# Patient Record
Sex: Male | Born: 1960 | Hispanic: No | Marital: Single | Smoking: Current every day smoker
Health system: Southern US, Community
[De-identification: ages and names within clinical notes are randomized; demographics above are authoritative.]

## PROBLEM LIST (undated history)

## (undated) DIAGNOSIS — I639 Cerebral infarction, unspecified: Secondary | ICD-10-CM

## (undated) DIAGNOSIS — J439 Emphysema, unspecified: Secondary | ICD-10-CM

## (undated) DIAGNOSIS — Z87891 Personal history of nicotine dependence: Secondary | ICD-10-CM

## (undated) DIAGNOSIS — Z800 Family history of malignant neoplasm of digestive organs: Secondary | ICD-10-CM

## (undated) DIAGNOSIS — F1011 Alcohol abuse, in remission: Secondary | ICD-10-CM

## (undated) DIAGNOSIS — Z85038 Personal history of other malignant neoplasm of large intestine: Secondary | ICD-10-CM

## (undated) DIAGNOSIS — I10 Essential (primary) hypertension: Secondary | ICD-10-CM

## (undated) DIAGNOSIS — K5792 Diverticulitis of intestine, part unspecified, without perforation or abscess without bleeding: Secondary | ICD-10-CM

## (undated) DIAGNOSIS — N400 Enlarged prostate without lower urinary tract symptoms: Secondary | ICD-10-CM

## (undated) DIAGNOSIS — C61 Malignant neoplasm of prostate: Secondary | ICD-10-CM

## (undated) HISTORY — DX: Family history of malignant neoplasm of digestive organs: Z80.0

## (undated) HISTORY — PX: TONSILLECTOMY: SUR1361

---

## 2018-03-24 ENCOUNTER — Inpatient Hospital Stay (HOSPITAL_COMMUNITY)
Admission: AD | Admit: 2018-03-24 | Discharge: 2018-03-28 | DRG: 391 | Disposition: A | Discharge status: Home / Self Care | Payer: Medicaid Other | Source: Other Acute Inpatient Hospital | Attending: Internal Medicine | Admitting: Internal Medicine

## 2018-03-24 DIAGNOSIS — Z6821 Body mass index (BMI) 21.0-21.9, adult: Secondary | ICD-10-CM

## 2018-03-24 DIAGNOSIS — K5720 Diverticulitis of large intestine with perforation and abscess without bleeding: Secondary | ICD-10-CM | POA: Diagnosis present

## 2018-03-24 DIAGNOSIS — E43 Unspecified severe protein-calorie malnutrition: Secondary | ICD-10-CM | POA: Diagnosis present

## 2018-03-24 DIAGNOSIS — E876 Hypokalemia: Secondary | ICD-10-CM | POA: Diagnosis present

## 2018-03-24 DIAGNOSIS — N419 Inflammatory disease of prostate, unspecified: Secondary | ICD-10-CM | POA: Diagnosis present

## 2018-03-24 DIAGNOSIS — Z720 Tobacco use: Secondary | ICD-10-CM | POA: Diagnosis not present

## 2018-03-24 DIAGNOSIS — I10 Essential (primary) hypertension: Secondary | ICD-10-CM | POA: Diagnosis present

## 2018-03-24 DIAGNOSIS — F1020 Alcohol dependence, uncomplicated: Secondary | ICD-10-CM | POA: Diagnosis present

## 2018-03-24 DIAGNOSIS — N410 Acute prostatitis: Secondary | ICD-10-CM

## 2018-03-24 DIAGNOSIS — R109 Unspecified abdominal pain: Secondary | ICD-10-CM | POA: Diagnosis present

## 2018-03-24 HISTORY — DX: Diverticulitis of intestine, part unspecified, without perforation or abscess without bleeding: K57.92

## 2018-03-24 LAB — COMPREHENSIVE METABOLIC PANEL
ALK PHOS: 52 U/L (ref 38–126)
ALT: 11 U/L — ABNORMAL LOW (ref 17–63)
ANION GAP: 11 (ref 5–15)
AST: 13 U/L — ABNORMAL LOW (ref 15–41)
Albumin: 2.4 g/dL — ABNORMAL LOW (ref 3.5–5.0)
BILIRUBIN TOTAL: 0.7 mg/dL (ref 0.3–1.2)
BUN: 8 mg/dL (ref 6–20)
CO2: 26 mmol/L (ref 22–32)
CREATININE: 0.83 mg/dL (ref 0.61–1.24)
Calcium: 8.6 mg/dL — ABNORMAL LOW (ref 8.9–10.3)
Chloride: 99 mmol/L — ABNORMAL LOW (ref 101–111)
Glucose, Bld: 81 mg/dL (ref 65–99)
Potassium: 3.3 mmol/L — ABNORMAL LOW (ref 3.5–5.1)
SODIUM: 136 mmol/L (ref 135–145)
TOTAL PROTEIN: 6.7 g/dL (ref 6.5–8.1)

## 2018-03-24 LAB — CBC
HCT: 37.4 % — ABNORMAL LOW (ref 39.0–52.0)
HEMOGLOBIN: 12 g/dL — AB (ref 13.0–17.0)
MCH: 28.7 pg (ref 26.0–34.0)
MCHC: 32.1 g/dL (ref 30.0–36.0)
MCV: 89.5 fL (ref 78.0–100.0)
PLATELETS: 569 10*3/uL — AB (ref 150–400)
RBC: 4.18 MIL/uL — AB (ref 4.22–5.81)
RDW: 13 % (ref 11.5–15.5)
WBC: 17.9 10*3/uL — AB (ref 4.0–10.5)

## 2018-03-24 MED ORDER — DEXTROSE-NACL 5-0.45 % IV SOLN
INTRAVENOUS | Status: DC
  Administered 2018-03-24 – 2018-03-26 (×3): via INTRAVENOUS

## 2018-03-24 MED ORDER — HYDROCODONE-ACETAMINOPHEN 5-325 MG PO TABS: 1.0000 | ORAL_TABLET | ORAL | Status: DC | PRN

## 2018-03-24 MED ORDER — FENTANYL CITRATE (PF) 100 MCG/2ML IJ SOLN: 50.0000 ug | INTRAMUSCULAR | Status: DC | PRN

## 2018-03-24 MED ORDER — ONDANSETRON HCL 4 MG PO TABS: 4.0000 mg | ORAL_TABLET | Freq: Four times a day (QID) | ORAL | Status: DC | PRN

## 2018-03-24 MED ORDER — ACETAMINOPHEN 325 MG PO TABS: 650.0000 mg | ORAL_TABLET | Freq: Four times a day (QID) | ORAL | Status: DC | PRN

## 2018-03-24 MED ORDER — ZOLPIDEM TARTRATE 5 MG PO TABS: 5.0000 mg | ORAL_TABLET | Freq: Every evening | ORAL | Status: DC | PRN

## 2018-03-24 MED ORDER — ACETAMINOPHEN 650 MG RE SUPP: 650.0000 mg | Freq: Four times a day (QID) | RECTAL | Status: DC | PRN

## 2018-03-24 MED ORDER — PIPERACILLIN-TAZOBACTAM 3.375 G IVPB 30 MIN
3.3750 g | Freq: Once | INTRAVENOUS | Status: AC
  Administered 2018-03-24: 3.375 g via INTRAVENOUS
  Filled 2018-03-24: qty 50

## 2018-03-24 MED ORDER — HEPARIN SODIUM (PORCINE) 5000 UNIT/ML IJ SOLN: 5000.0000 [IU] | Freq: Three times a day (TID) | INTRAMUSCULAR | Status: DC

## 2018-03-24 MED ORDER — ONDANSETRON HCL 4 MG/2ML IJ SOLN: 4.0000 mg | Freq: Four times a day (QID) | INTRAMUSCULAR | Status: DC | PRN

## 2018-03-24 NOTE — H&P (Signed)
[facility]                                                                                    Patient Demographics  Brian Dudley, is a 57 y.o. male  CSN: [id number]  MRN: [id number]  DOB - 1961/06/28  Admit Date - 03/24/2018  Outpatient Primary MD for the patient is No primary care provider on file.   With History of -  No past medical history on file.    No significant past medical history except for alcoholism  in for   Evaluation of abdominal abscess  HPI  Brian Dudley  is a 57 y.o. male, with past medical history significant for alcoholism and smoking presenting with 2 weeks history of on and off abdominal discomfort after a bout of alcoholism according to [facility]  He was admitted yesterday after he started developing fever, malaise and suprapubic pain, and was found to have leukocytosis with perforated sigmoid diverticulitis and a 7 cm abscess close to the urinary bladder.  Patient was admitted and started on IV Zosyn and his white blood cell count improved and was sent here to be evaluated by IR and surgery Dr. Gaynelle AduEric Wilson for management.  His last alcoholic drink was 2 weeks ago and the patient has no signs of withdrawal.    Review of Systems    In addition to the HPI above, and at this time No chills No Headache, No changes with Vision or hearing, No problems swallowing food or Liquids, No Chest pain, Cough or Shortness of Breath, No Abdominal pain, Bowel movements are regular, No Blood in stool or Urine, No dysuria, No new skin rashes or bruises, No new joints pains-aches,  No new weakness, tingling, numbness in any extremity, No recent weight gain or loss, No polyuria, polydypsia or polyphagia, No significant Mental Stressors.  A full 10 point Review of Systems was done, except as stated above, all other Review of Systems were negative.   Social History Social History   Tobacco Use  .  Smoking status: Not on file  Substance Use Topics  . Alcohol use: Not on file     Family History No family history on file.   Prior to Admission medications   Not on File    Allergies not on file  Physical Exam  Vitals  Blood pressure (!) 143/82, pulse (!) 115, temperature 99 F (37.2 C), temperature source Oral, resp. rate (!) 22, height 5\' 6"  (1.676 m), weight 61.7 kg (136 lb 0.4 oz), SpO2 99 %.   1. General Young male, well-developed, well-nourished in no acute distress  2. Normal affect and insight, Not Suicidal or Homicidal, Awake Alert, Oriented X 3.  3. No F.N deficits, grossly, patient moving all extremities.  4. Ears and Eyes appear Normal, Conjunctivae clear, PERRLA. Moist Oral Mucosa.  5. Supple Neck, No JVD, No cervical lymphadenopathy appriciated, No Carotid Bruits.  6. Symmetrical Chest wall movement, Good air movement bilaterally, CTAB.  7. RRR, No Gallops, Rubs or Murmurs, No Parasternal Heave.  8. Positive Bowel Sounds, Abdomen Soft, mild suprapubic tenderness but no rigidity.  9.  No Cyanosis,  Normal Skin Turgor, No Skin Rash or Bruise.  10. Good muscle tone,  joints appear normal , no effusions, Normal ROM.    Data Review  CBC No results for input(s): WBC, HGB, HCT, PLT, MCV, MCH, MCHC, RDW, LYMPHSABS, MONOABS, EOSABS, BASOSABS, BANDABS in the last 168 hours.  Invalid input(s): NEUTRABS, BANDSABD ------------------------------------------------------------------------------------------------------------------  Chemistries  No results for input(s): NA, K, CL, CO2, GLUCOSE, BUN, CREATININE, CALCIUM, MG, AST, ALT, ALKPHOS, BILITOT in the last 168 hours.  Invalid input(s): GFRCGP ------------------------------------------------------------------------------------------------------------------ CrCl cannot be calculated (No order  found.). ------------------------------------------------------------------------------------------------------------------ No results for input(s): TSH, T4TOTAL, T3FREE, THYROIDAB in the last 72 hours.  Invalid input(s): FREET3   Coagulation profile No results for input(s): INR, PROTIME in the last 168 hours. ------------------------------------------------------------------------------------------------------------------- No results for input(s): DDIMER in the last 72 hours. -------------------------------------------------------------------------------------------------------------------  Cardiac Enzymes No results for input(s): CKMB, TROPONINI, MYOGLOBIN in the last 168 hours.  Invalid input(s): CK ------------------------------------------------------------------------------------------------------------------ Invalid input(s): POCBNP   ---------------------------------------------------------------------------------------------------------------  Urinalysis No results found for: COLORURINE, APPEARANCEUR, LABSPEC, PHURINE, GLUCOSEU, HGBUR, BILIRUBINUR, KETONESUR, PROTEINUR, UROBILINOGEN, NITRITE, LEUKOCYTESUR  ----------------------------------------------------------------------------------------------------------------   Imaging results:   No results found.    Assessment & Plan  1.  Sigmoid diverticulitis and perforation with abscess 2.  Prostatitis 3.  History of alcoholism  Plan  Admit to MedSurg IV fluids IV Zosyn Thiamine/folate N.p.o. after midnight (patient taken ice chips) IR consult for abscess Contact Dr. Gaynelle Adu in a.m. for surgery evaluation and treatment  DVT Prophylaxis Heparin to start after surgery in a.m., but for now SCDs  Labs Ordered, also please review Full Orders    Code Status full  Disposition Plan: Home  Time spent in minutes : 38 minutes  Condition GUARDED   @SIGNATURE @

## 2018-03-25 ENCOUNTER — Inpatient Hospital Stay (HOSPITAL_COMMUNITY): Payer: Medicaid Other

## 2018-03-25 ENCOUNTER — Other Ambulatory Visit: Payer: Self-pay

## 2018-03-25 ENCOUNTER — Encounter (HOSPITAL_COMMUNITY): Payer: Self-pay | Admitting: Physician Assistant

## 2018-03-25 DIAGNOSIS — K5720 Diverticulitis of large intestine with perforation and abscess without bleeding: Secondary | ICD-10-CM

## 2018-03-25 LAB — SURGICAL PCR SCREEN
MRSA, PCR: NEGATIVE
Staphylococcus aureus: NEGATIVE

## 2018-03-25 LAB — PROTIME-INR
INR: 1.21
PROTHROMBIN TIME: 15.2 s (ref 11.4–15.2)

## 2018-03-25 LAB — HIV ANTIBODY (ROUTINE TESTING W REFLEX): HIV Screen 4th Generation wRfx: NONREACTIVE

## 2018-03-25 MED ORDER — HEPARIN SODIUM (PORCINE) 5000 UNIT/ML IJ SOLN
5000.0000 [IU] | Freq: Three times a day (TID) | INTRAMUSCULAR | Status: DC
  Administered 2018-03-26 – 2018-03-28 (×7): 5000 [IU] via SUBCUTANEOUS
  Filled 2018-03-25 (×7): qty 1

## 2018-03-25 MED ORDER — MUPIROCIN 2 % EX OINT: 1.0000 "application " | TOPICAL_OINTMENT | Freq: Two times a day (BID) | CUTANEOUS | Status: DC

## 2018-03-25 MED ORDER — LIDOCAINE HCL 1 % IJ SOLN
INTRAMUSCULAR | Status: AC
Start: 2018-03-25 — End: 2018-03-26
  Filled 2018-03-25: qty 20

## 2018-03-25 MED ORDER — POTASSIUM CHLORIDE CRYS ER 20 MEQ PO TBCR
20.0000 meq | EXTENDED_RELEASE_TABLET | Freq: Every day | ORAL | Status: DC
  Administered 2018-03-25 – 2018-03-26 (×2): 20 meq via ORAL
  Filled 2018-03-25 (×2): qty 1

## 2018-03-25 MED ORDER — PIPERACILLIN-TAZOBACTAM 3.375 G IVPB
3.3750 g | Freq: Three times a day (TID) | INTRAVENOUS | Status: DC
  Administered 2018-03-25 – 2018-03-27 (×7): 3.375 g via INTRAVENOUS
  Filled 2018-03-25 (×8): qty 50

## 2018-03-25 MED ORDER — IOHEXOL 300 MG/ML  SOLN
100.0000 mL | Freq: Once | INTRAMUSCULAR | Status: AC | PRN
  Administered 2018-03-25: 100 mL via INTRAVENOUS

## 2018-03-25 MED ORDER — MIDAZOLAM HCL 2 MG/2ML IJ SOLN
INTRAMUSCULAR | Status: AC
  Filled 2018-03-25: qty 6

## 2018-03-25 MED ORDER — IOPAMIDOL (ISOVUE-300) INJECTION 61%
15.0000 mL | INTRAVENOUS | Status: AC
  Administered 2018-03-25 (×2): 15 mL via ORAL

## 2018-03-25 MED ORDER — FENTANYL CITRATE (PF) 100 MCG/2ML IJ SOLN
INTRAMUSCULAR | Status: AC
  Filled 2018-03-25: qty 2

## 2018-03-25 NOTE — Progress Notes (Signed)
Pharmacy Antibiotic Note  Brian Dudley is a 57 y.o. male admitted on 03/24/2018 with intra abdominal infection. Pharmacy has been consulted for zosyn dosing.  Plan: Zosyn 3.375g IV q8h (4 hour infusion).  F/u cultures and clinical course  Height: 5\' 6"  (167.6 cm) Weight: 136 lb 0.4 oz (61.7 kg) IBW/kg (Calculated) : 63.8  Temp (24hrs), Avg:99 F (37.2 C), Min:99 F (37.2 C), Max:99 F (37.2 C)  Recent Labs  Lab 03/24/18 2245  WBC 17.9*  CREATININE 0.83    Estimated Creatinine Clearance: 85.7 mL/min (by C-G formula based on SCr of 0.83 mg/dL).    Allergies not on file  Thank you for allowing pharmacy to be a part of this patient's care.  Brian Dudley,  Brian Dudley 03/25/2018 12:34 AM

## 2018-03-25 NOTE — Sedation Documentation (Signed)
Dr. Deanne CofferHassell at bedside to discuss case with pt. There is no safe widow to place drain. Dr. Deanne CofferHassell to discuss case with surgeon. Pt to be transported back to room 6N22.

## 2018-03-25 NOTE — Progress Notes (Signed)
[facility]                                                                              Patient Demographics  Brian Dudley, is a 57 y.o. male, DOB - 04/29/1961, ZOX:[id number]  Admit date - 03/24/2018   Admitting Physician Lahoma Crockerheresa C Sheehan, MD  Outpatient Primary MD for the patient is No primary care provider on file.  Outpatient specialists:   LOS - 1  days    No chief complaint on file.      Brief summary   Brian Dutyhomas Cabal  is a 57 y.o. male, with past medical history significant for but not limited to alcoholism presenting with 2 weeks history abdominal discomfort after a bout of alcoholism, and noted to have  perforated sigmoid diverticulitis and abscess close to the urinary bladder, an an outside facility.    Patient was admitted and started on IV Zosyn and his white blood cell count improved and was sent here to be evaluated by IR and surgery Dr. Gaynelle AduEric Wilson for management.  His last alcoholic drink was 2 weeks ago and the patient has no signs of withdrawal.     Assessment & Plan    Active Problems:   Perforation of sigmoid colon due to diverticulitis   #1.Perforation of sigmoid colon due to diverticulitis with abscess: Trend lekocytosis IV fluids IV Abx Thiamine/folate Seen by Dr Johna SheriffHoxworth - for repeat scan to decide on poss intervention with perc cut drainage by IR. Cont NPO for now  #2: Hypokalemia: Replete  #3 Protein Cal Malnutrition, severe: Nutritional support  #4 Alcoholism: Cont CIWA protocol      Consultants:  Surgery  Interventional radiology  Procedures:  None  Antimicrobials:  Zosyn  DVT prophylaxis: Subcu heparin 3 times daily    Code Status: full code DVT Prophylaxis:  Lovenox  Family Communication: Discussed in detail with the patient  Disposition Plan: Home  Time Spent in minutes  35 minutes  Procedures:    Consultants:   IR GEN SURGERY   Antimicrobials:       Medications  Scheduled Meds: . [START ON 03/26/2018] heparin  5,000 Units Subcutaneous Q8H   Continuous Infusions: . dextrose 5 % and 0.45% NaCl 75 mL/hr at 03/24/18 2310   PRN Meds:.acetaminophen **OR** acetaminophen, fentaNYL (SUBLIMAZE) injection, HYDROcodone-acetaminophen, ondansetron **OR** ondansetron (ZOFRAN) IV, zolpidem   Antibiotics   Anti-infectives (From admission, onward)   Start     Dose/Rate Route Frequency Ordered Stop   03/24/18 2215  piperacillin-tazobactam (ZOSYN) IVPB 3.375 g     3.375 g 100 mL/hr over 30 Minutes Intravenous  Once 03/24/18 2206 03/24/18 2341        Subjective:   Brian Dutyhomas Ferrera was seen and examined today.  Patient denies dizziness, chest pain, shortness of breath. His abdominal pain is a lot better  Objective:   Vitals:   03/24/18 2159 03/25/18 0521  BP: (!) 143/82 (!) 157/84  Pulse: (!) 115 98  Resp: (!) 22 14  Temp: 99 F (37.2 C) 98.9 F (37.2 C)  TempSrc: Oral Oral  SpO2: 99% 100%  Weight: 61.7 kg (136 lb 0.4 oz)   Height:  5\' 6"  (1.676 m)     Intake/Output Summary (Last 24 hours) at 03/25/2018 0914 Last data filed at 03/25/2018 0430 Gross per 24 hour  Intake 430 ml  Output -  Net 430 ml     Wt Readings from Last 3 Encounters:  03/24/18 61.7 kg (136 lb 0.4 oz)     Exam  General: NAD  HEENT: NCAT,  PERRL,MMM  Neck: SUPPLE, (-) JVD  Cardiovascular: RRR, (-) GALLOP, (-) MURMUR  Respiratory: CTA  Gastrointestinal: SOFT, (-) DISTENSION, BS(+), (+) mild lower abd TENDERNESS  Ext: (-) CYANOSIS, (-) EDEMA  Neuro: A, OX 3  Skin:(-) RASH  Psych:NORMAL AFFECT/MOOD   Data Reviewed:  I have personally reviewed following labs and imaging studies  Micro Results Recent Results (from the past 240 hour(s))  Surgical PCR screen     Status: None   Collection Time: 03/25/18  2:02 AM  Result Value Ref Range Status   MRSA, PCR NEGATIVE NEGATIVE Final   Staphylococcus aureus NEGATIVE NEGATIVE Final     Comment: (NOTE) The Xpert SA Assay (FDA approved for NASAL specimens in patients 20 years of age and older), is one component of a comprehensive surveillance program. It is not intended to diagnose infection nor to guide or monitor treatment. Performed at [facility], [address], [place], [place] [address]     Radiology Reports No results found.  Lab Data:  CBC: Recent Labs  Lab 03/24/18 2245  WBC 17.9*  HGB 12.0*  HCT 37.4*  MCV 89.5  PLT 569*   Basic Metabolic Panel: Recent Labs  Lab 03/24/18 2245  NA 136  K 3.3*  CL 99*  CO2 26  GLUCOSE 81  BUN 8  CREATININE 0.83  CALCIUM 8.6*   GFR: Estimated Creatinine Clearance: 85.7 mL/min (by C-G formula based on SCr of 0.83 mg/dL). Liver Function Tests: Recent Labs  Lab 03/24/18 2245  AST 13*  ALT 11*  ALKPHOS 52  BILITOT 0.7  PROT 6.7  ALBUMIN 2.4*   No results for input(s): LIPASE, AMYLASE in the last 168 hours. No results for input(s): AMMONIA in the last 168 hours. Coagulation Profile: No results for input(s): INR, PROTIME in the last 168 hours. Cardiac Enzymes: No results for input(s): CKTOTAL, CKMB, CKMBINDEX, TROPONINI in the last 168 hours. BNP (last 3 results) No results for input(s): PROBNP in the last 8760 hours. HbA1C: No results for input(s): HGBA1C in the last 72 hours. CBG: No results for input(s): GLUCAP in the last 168 hours. Lipid Profile: No results for input(s): CHOL, HDL, LDLCALC, TRIG, CHOLHDL, LDLDIRECT in the last 72 hours. Thyroid Function Tests: No results for input(s): TSH, T4TOTAL, FREET4, T3FREE, THYROIDAB in the last 72 hours. Anemia Panel: No results for input(s): VITAMINB12, FOLATE, FERRITIN, TIBC, IRON, RETICCTPCT in the last 72 hours. Urine analysis: No results found for: COLORURINE, APPEARANCEUR, LABSPEC, PHURINE, GLUCOSEU, HGBUR, BILIRUBINUR, KETONESUR, PROTEINUR, UROBILINOGEN, NITRITE, Murrell Converse M.D. [facility] 03/25/2018,  9:14 AM  Pager: [phone] Between 7am to 7pm - call Pager - [phone]  After 7pm go to [email or web address] - password [email or web address]  Call night coverage person covering after 7pm

## 2018-03-25 NOTE — Progress Notes (Signed)
Patient arrived via stretcher and two Production managertransport personnel. Brian Dudley walked to the bed from the stretcher, was oriented to the room and presented with mild confusion as to where he was and why he was here.

## 2018-03-25 NOTE — Consult Note (Signed)
Reason for Consult: Abdominal abscess Referring Physician: Dr. Roxanne Mins Brian Dudley is an 57 y.o. male.  HPI: Patient is a 57 year old male, alcoholic, working regularly who 2 to 3 weeks ago developed lower abdominal pain with bowel movements.  The pain gradually became worse and he presented to his local hospital in [facility].  CT scan reportedly showed sigmoid diverticulitis with a 7 cm abscess adjacent to the bladder.  He was started on IV antibiotics.  He has family here in [place] and transfer was requested as interventional radiology was not available at that facility.  He was transferred yesterday.  He states his pain is quite a bit better compared to admission 2 days ago.  Denies any previous history of similar symptoms or chronic GI complaints.  Bowel movements other than pain have been normal.  No urinary symptoms.  Denies any chronic medical problems.  Only surgery is tonsillectomy  No home medications  Allergies none   Social History: Drinks heavily, none in 2 weeks.   Current Facility-Administered Medications  Medication Dose Route Frequency Provider Last Rate Last Dose  . acetaminophen (TYLENOL) tablet 650 mg  650 mg Oral Q6H PRN Merton Border, MD       Or  . acetaminophen (TYLENOL) suppository 650 mg  650 mg Rectal Q6H PRN Merton Border, MD      . dextrose 5 %-0.45 % sodium chloride infusion   Intravenous Continuous Merton Border, MD 75 mL/hr at 03/24/18 2310    . fentaNYL (SUBLIMAZE) injection 50 mcg  50 mcg Intravenous Q2H PRN Merton Border, MD      . Derrill Memo ON 03/26/2018] heparin injection 5,000 Units  5,000 Units Subcutaneous Q8H Ardis Rowan, PA-C      . HYDROcodone-acetaminophen (NORCO/VICODIN) 5-325 MG per tablet 1-2 tablet  1-2 tablet Oral Q4H PRN Merton Border, MD      . ondansetron (ZOFRAN) tablet 4 mg  4 mg Oral Q6H PRN Merton Border, MD       Or  . ondansetron (ZOFRAN) injection 4 mg  4 mg Intravenous Q6H PRN Merton Border, MD      . zolpidem  (AMBIEN) tablet 5 mg  5 mg Oral QHS PRN,MR X 1 Merton Border, MD         Results for orders placed or performed during the hospital encounter of 03/24/18 (from the past 48 hour(s))  Comprehensive metabolic panel     Status: Abnormal   Collection Time: 03/24/18 10:45 PM  Result Value Ref Range   Sodium 136 135 - 145 mmol/L   Potassium 3.3 (L) 3.5 - 5.1 mmol/L   Chloride 99 (L) 101 - 111 mmol/L   CO2 26 22 - 32 mmol/L   Glucose, Bld 81 65 - 99 mg/dL   BUN 8 6 - 20 mg/dL   Creatinine, Ser 0.83 0.61 - 1.24 mg/dL   Calcium 8.6 (L) 8.9 - 10.3 mg/dL   Total Protein 6.7 6.5 - 8.1 g/dL   Albumin 2.4 (L) 3.5 - 5.0 g/dL   AST 13 (L) 15 - 41 U/L   ALT 11 (L) 17 - 63 U/L   Alkaline Phosphatase 52 38 - 126 U/L   Total Bilirubin 0.7 0.3 - 1.2 mg/dL   GFR calc non Af Amer >60 >60 mL/min   GFR calc Af Amer >60 >60 mL/min    Comment: (NOTE) The eGFR has been calculated using the CKD EPI equation. This calculation has not been validated in all clinical situations. eGFR's persistently <60 mL/min  signify possible Chronic Kidney Disease.    Anion gap 11 5 - 15    Comment: Performed at [facility] [address], [place], [place] [address]  CBC     Status: Abnormal   Collection Time: 03/24/18 10:45 PM  Result Value Ref Range   WBC 17.9 (H) 4.0 - 10.5 K/uL   RBC 4.18 (L) 4.22 - 5.81 MIL/uL   Hemoglobin 12.0 (L) 13.0 - 17.0 g/dL   HCT 37.4 (L) 39.0 - 52.0 %   MCV 89.5 78.0 - 100.0 fL   MCH 28.7 26.0 - 34.0 pg   MCHC 32.1 30.0 - 36.0 g/dL   RDW 13.0 11.5 - 15.5 %   Platelets 569 (H) 150 - 400 K/uL    Comment: Performed at [facility] [address] [place], [place] [address]  Surgical PCR screen     Status: None   Collection Time: 03/25/18  2:02 AM  Result Value Ref Range   MRSA, PCR NEGATIVE NEGATIVE   Staphylococcus aureus NEGATIVE NEGATIVE    Comment: (NOTE) The Xpert SA Assay (FDA approved for NASAL specimens in patients 29 years of age and older), is one component of a  comprehensive surveillance program. It is not intended to diagnose infection nor to guide or monitor treatment. Performed at [facility], [place] [address], [place],  [address]     No results found.  Review of Systems  Constitutional: Negative for chills and fever.  HENT: Negative.   Respiratory: Negative.   Cardiovascular: Negative.   Gastrointestinal: Positive for abdominal pain. Negative for blood in stool, constipation, diarrhea, nausea and vomiting.  Genitourinary: Negative.   Musculoskeletal: Negative.    Blood pressure (!) 157/84, pulse 98, temperature 98.9 F (37.2 C), temperature source Oral, resp. rate 14, height 5' 6" (1.676 m), weight 61.7 kg (136 lb 0.4 oz), SpO2 100 %. Physical Exam General: Alert, well-developed African-American male, in no distress Skin: Warm and dry without rash or infection. HEENT: No palpable masses or thyromegaly. Sclera nonicteric.  Poor dentition. Lymph nodes: No cervical, supraclavicular, or inguinal nodes palpable. Lungs: Breath sounds clear and equal without increased work of breathing Cardiovascular: Regular rate and rhythm without murmur. No JVD or edema. Peripheral pulses intact. Abdomen: Nondistended.  Localized suprapubic tenderness with some guarding.  No palpable mass. No organomegaly. No palpable hernias. Extremities: No edema or joint swelling or deformity. No chronic venous stasis changes. Neurologic: Alert and fully oriented.  Affect normal.  No gross motor or sensory deficits.  Assessment/Plan: Apparent acute diverticulitis with pericolonic abscess.  No imaging currently to review.  Patient is on appropriate broad-spectrum antibiotics and is clinically improved.  No acute surgical abdomen.  He is to go for repeat CT scan and if abscess confirmed agree with plan for percutaneous drainage.  We will follow.  Darene Lamer  03/25/2018, 9:01 AM

## 2018-03-25 NOTE — H&P (Signed)
Chief Complaint: Abscess   Referring Physician(s): Hijazi Hoxworth  Supervising Physician: Oley BalmHassell, Daniel  [facility]  History of Present Illness: Brian Dudley is a 57 y.o. male who developed lower abdominal pain with bowel movements.    The pain gradually became worse and he presented to [facility] [place].    CT scan there reportedly showed sigmoid diverticulitis with a 7 cm abscess adjacent to the bladder.  He was started on IV antibiotics.    Interventional radiology is not available at that Hospital and since he has family here in [place], he requested transfer here to [facility].  He states his pain is quite a bit better compared to admission 2 days ago.    Denies any previous history of similar symptoms or chronic GI complaints.  Bowel movements other than pain have been normal.  No urinary symptoms. No fever/chills.   We are asked to evaluate him for image guided drainage/drain placement.  He denies any other medical issues.    Past Medical History: Alcoholism Prostatitis  Past Surgical History: Tonsillectomy  Allergies: Patient has no allergy information on record.  Medications: Prior to Admission medications   Not on File     History reviewed. No pertinent family history.  Social History   Socioeconomic History  . Marital status: Unknown    Spouse name: Not on file  . Number of children: Not on file  . Years of education: Not on file  . Highest education level: Not on file  Occupational History  . Not on file  Social Needs  . Financial resource strain: Not on file  . Food insecurity:    Worry: Not on file    Inability: Not on file  . Transportation needs:    Medical: Not on file    Non-medical: Not on file  Tobacco Use  . Smoking status: Not on file  Substance and Sexual Activity  . Alcohol use: Not on file  . Drug use: Not on file  . Sexual activity: Not on  file  Lifestyle  . Physical activity:    Days per week: Not on file    Minutes per session: Not on file  . Stress: Not on file  Relationships  . Social connections:    Talks on phone: Not on file    Gets together: Not on file    Attends religious service: Not on file    Active member of club or organization: Not on file    Attends meetings of clubs or organizations: Not on file    Relationship status: Not on file  Other Topics Concern  . Not on file  Social History Narrative  . Not on file     Review of Systems: A 12 point ROS discussed and pertinent positives are indicated in the HPI above.  All other systems are negative. Review of Systems  Vital Signs: BP (!) 157/84 (BP Location: Left Arm)   Pulse 98   Temp 98.9 F (37.2 C) (Oral)   Resp 14   Ht 5\' 6"  (1.676 m)   Wt 136 lb 0.4 oz (61.7 kg)   SpO2 100%   BMI 21.95 kg/m   Physical Exam  Constitutional: He is oriented to person, place, and time. He appears well-developed.  HENT:  Head: Normocephalic and atraumatic.  Eyes: EOM are normal.  Neck: Normal range of motion.  Cardiovascular: Normal rate, regular rhythm and normal heart sounds.  Pulmonary/Chest: Effort  normal and breath sounds normal.  Abdominal: Soft. There is tenderness.  Musculoskeletal: Normal range of motion.  Neurological: He is alert and oriented to person, place, and time.  Skin: Skin is warm and dry.  Psychiatric: He has a normal mood and affect. His behavior is normal. Judgment and thought content normal.  Vitals reviewed.   Imaging: Ct Abdomen Pelvis W Contrast  Result Date: 03/25/2018 CLINICAL DATA:  Abdominal pain. Fever. Sigmoid colon perforation secondary to diverticulitis. Suspicion of abscess. EXAM: CT ABDOMEN AND PELVIS WITH CONTRAST TECHNIQUE: Multidetector CT imaging of the abdomen and pelvis was performed using the standard protocol following bolus administration of intravenous contrast. CONTRAST:  OMNIPAQUE IOHEXOL 300 MG/ML   SOLN COMPARISON:  None. FINDINGS: Lower chest: Emphysema. Subsegmental atelectasis or scarring at both lung bases. Normal heart size without pericardial or pleural effusion. Hepatobiliary: Too small to characterize right liver lobe lesion on image 16/3. Focal steatosis adjacent the falciform ligament. Normal gallbladder, without biliary ductal dilatation. Pancreas: Normal, without mass or ductal dilatation. Spleen: Normal in size, without focal abnormality. Adrenals/Urinary Tract: Normal adrenal glands. Left renal too small to characterize lesions. Normal right kidney, without hydronephrosis or hydroureter. Normal urinary bladder. Stomach/Bowel: The stomach is primarily decompressed and underdistended. Relatively mild diverticulosis centered in the sigmoid. Wall thickening with relatively localized edema and a macrolobulated complex fluid collection or collections within the mesocolon. This is peripherally enhancing and measures on the order of 8.1 x 6.4 cm on image 47/3. 6.5 cm craniocaudal on sagittal image 90/7. No free perforation. Normal terminal ileum and appendix. Normal small bowel. Vascular/Lymphatic: Aortic and branch vessel atherosclerosis. No abdominopelvic adenopathy. Reproductive: Normal prostate. Other: No significant free fluid. There is mild presacral interstitial thickening which is nonspecific and may track from the more cephalad pelvic process. Musculoskeletal: Degenerate disc disease at the lumbosacral junction. IMPRESSION: 1. Complicated sigmoid diverticulitis. Macrolobulated complex fluid collection or collections within the sigmoid mesocolon, most consistent with phlegmon or developing abscess/abscesses. 2.  Aortic Atherosclerosis (ICD10-I70.0). These results will be called to the ordering clinician or representative by the Radiologist Assistant, and communication documented in the PACS or zVision Dashboard. Electronically Signed   By: Jeronimo Greaves M.D.   On: 03/25/2018 12:57     Labs:  CBC: Recent Labs    03/24/18 2245  WBC 17.9*  HGB 12.0*  HCT 37.4*  PLT 569*    COAGS: Recent Labs    03/25/18 0841  INR 1.21    BMP: Recent Labs    03/24/18 2245  NA 136  K 3.3*  CL 99*  CO2 26  GLUCOSE 81  BUN 8  CALCIUM 8.6*  CREATININE 0.83  GFRNONAA >60  GFRAA >60    LIVER FUNCTION TESTS: Recent Labs    03/24/18 2245  BILITOT 0.7  AST 13*  ALT 11*  ALKPHOS 52  PROT 6.7  ALBUMIN 2.4*    TUMOR MARKERS: No results for input(s): AFPTM, CEA, CA199, CHROMGRNA in the last 8760 hours.  Assessment and Plan:  8 cm diverticular abscess.  Images reviewed by Dr. Deanne Coffer  Will proceed with attempt at image guided drainage/drain placement today.  Risks and benefits discussed with the patient including bleeding, infection, damage to adjacent structures, bowel perforation/fistula connection, and sepsis.  All of the patient's questions were answered, patient is agreeable to proceed. Consent signed and in chart.  Thank you for this interesting consult.  I greatly enjoyed meeting Brian Dudley and look forward to participating in their care.  A copy of  this report was sent to the requesting provider on this date.  Electronically Signed: Gwynneth Macleod, PA-C   03/25/2018, 1:12 PM      I spent a total of 40 Minutes in face to face in clinical consultation, greater than 50% of which was counseling/coordinating care for Abscess drain placement

## 2018-03-26 LAB — CBC
HEMATOCRIT: 37.5 % — AB (ref 39.0–52.0)
HEMOGLOBIN: 12.5 g/dL — AB (ref 13.0–17.0)
MCH: 29.5 pg (ref 26.0–34.0)
MCHC: 33.3 g/dL (ref 30.0–36.0)
MCV: 88.4 fL (ref 78.0–100.0)
Platelets: 529 10*3/uL — ABNORMAL HIGH (ref 150–400)
RBC: 4.24 MIL/uL (ref 4.22–5.81)
RDW: 13 % (ref 11.5–15.5)
WBC: 17.2 10*3/uL — ABNORMAL HIGH (ref 4.0–10.5)

## 2018-03-26 LAB — COMPREHENSIVE METABOLIC PANEL
ALK PHOS: 49 U/L (ref 38–126)
ALT: 9 U/L — AB (ref 17–63)
AST: 12 U/L — ABNORMAL LOW (ref 15–41)
Albumin: 2.3 g/dL — ABNORMAL LOW (ref 3.5–5.0)
Anion gap: 8 (ref 5–15)
BUN: <5 mg/dL — ABNORMAL LOW (ref 6–20)
CALCIUM: 8.4 mg/dL — AB (ref 8.9–10.3)
CO2: 26 mmol/L (ref 22–32)
CREATININE: 0.7 mg/dL (ref 0.61–1.24)
Chloride: 101 mmol/L (ref 101–111)
GFR calc non Af Amer: >60 mL/min (ref >60)
Glucose, Bld: 132 mg/dL — ABNORMAL HIGH (ref 65–99)
Potassium: 2.8 mmol/L — ABNORMAL LOW (ref 3.5–5.1)
Sodium: 135 mmol/L (ref 135–145)
Total Bilirubin: 0.5 mg/dL (ref 0.3–1.2)
Total Protein: 6.7 g/dL (ref 6.5–8.1)

## 2018-03-26 MED ORDER — LORAZEPAM 2 MG/ML IJ SOLN: 0.0000 mg | Freq: Two times a day (BID) | INTRAMUSCULAR | Status: DC

## 2018-03-26 MED ORDER — LORAZEPAM 2 MG/ML IJ SOLN: 0.0000 mg | Freq: Four times a day (QID) | INTRAMUSCULAR | Status: AC

## 2018-03-26 MED ORDER — MAGNESIUM SULFATE 2 GM/50ML IV SOLN
2.0000 g | Freq: Once | INTRAVENOUS | Status: AC
  Administered 2018-03-26: 2 g via INTRAVENOUS
  Filled 2018-03-26: qty 50

## 2018-03-26 MED ORDER — POTASSIUM CHLORIDE CRYS ER 20 MEQ PO TBCR
40.0000 meq | EXTENDED_RELEASE_TABLET | Freq: Once | ORAL | Status: AC
  Administered 2018-03-26: 40 meq via ORAL
  Filled 2018-03-26: qty 2

## 2018-03-26 MED ORDER — POTASSIUM CHLORIDE 10 MEQ/100ML IV SOLN
10.0000 meq | INTRAVENOUS | Status: AC
  Administered 2018-03-26 (×6): 10 meq via INTRAVENOUS
  Filled 2018-03-26: qty 100

## 2018-03-26 MED ORDER — VITAMIN B-1 100 MG PO TABS
100.0000 mg | ORAL_TABLET | Freq: Every day | ORAL | Status: DC
  Administered 2018-03-26 – 2018-03-28 (×3): 100 mg via ORAL
  Filled 2018-03-26 (×3): qty 1

## 2018-03-26 MED ORDER — FOLIC ACID 1 MG PO TABS
1.0000 mg | ORAL_TABLET | Freq: Every day | ORAL | Status: DC
  Administered 2018-03-26 – 2018-03-28 (×3): 1 mg via ORAL
  Filled 2018-03-26 (×3): qty 1

## 2018-03-26 MED ORDER — THIAMINE HCL 100 MG/ML IJ SOLN: 100.0000 mg | Freq: Every day | INTRAMUSCULAR | Status: DC

## 2018-03-26 MED ORDER — ADULT MULTIVITAMIN W/MINERALS CH
1.0000 | ORAL_TABLET | Freq: Every day | ORAL | Status: DC
  Administered 2018-03-26 – 2018-03-28 (×3): 1 via ORAL
  Filled 2018-03-26 (×3): qty 1

## 2018-03-26 MED ORDER — LORAZEPAM 1 MG PO TABS: 1.0000 mg | ORAL_TABLET | Freq: Four times a day (QID) | ORAL | Status: DC | PRN

## 2018-03-26 MED ORDER — LORAZEPAM 2 MG/ML IJ SOLN: 1.0000 mg | Freq: Four times a day (QID) | INTRAMUSCULAR | Status: DC | PRN

## 2018-03-26 MED ORDER — KCL IN DEXTROSE-NACL 20-5-0.45 MEQ/L-%-% IV SOLN
INTRAVENOUS | Status: DC
  Administered 2018-03-26 – 2018-03-27 (×2): via INTRAVENOUS
  Filled 2018-03-26 (×2): qty 1000

## 2018-03-26 NOTE — Progress Notes (Signed)
PROGRESS NOTE  Brian Dudley ZOX:[id number] DOB: 1961/03/03 DOA: 03/24/2018 PCP: No primary care provider on file.  HPI/Recap of past 24 hours: [name] is a 57 year old male with past medical history significant for alcohol use disorder and tobacco use disorder who presented to the ED with 2 weeks history of intermittent abdominal discomfort.  CT abdomen and pelvis with contrast done on 03/25/2018 revealed complicated sigmoid diverticulitis with suspected developing abscess/abscesses.  Admitted for acute sigmoid diverticulitis complicated by suspected abscesses.  GI consulted and following.  03/26/2018: Patient seen and examined at bedside.  He denies any abdominal pain or nausea.  He is eating a clear liquid diet and tolerated well.  Admits to flatulence.  Assessment/Plan: Active Problems:   Perforation of sigmoid colon due to diverticulitis   Colonic diverticular abscess  Acute complicated sigmoid diverticulitis with suspicion for phlegmon Not amenable to drainage per interventional radiology General surgery consulted and following Continue to monitor fever curve Continue to monitor CBC, WBC Patient denies any abdominal pain today Advance diet as tolerated per general surgery recommendation   Hypokalemia Potassium 2.8 Repleted with p.o. and IV potassium Added IV magnesium 2 g Repeat BMP in the morning Obtain magnesium level in the morning  Severe protein calorie malnutrition Albumin 2.3 BMI 21 Encourage increased p.o. protein calorie intake  Alcohol use disorder with concern for withdrawal Continue CIWA protocol  Diverticulosis Follow-up with GI outpatient  Tobacco use disorder Nicotine patch as needed  Code Status: Full code  Family Communication: None at bedside  Disposition Plan: Home when clinically stable   Consultants:  Surgery  Interventional radiology  Procedures:  None  Antimicrobials:  Zosyn  DVT prophylaxis: Subcu heparin 3 times  daily   Objective: Vitals:   03/25/18 2239 03/26/18 0441 03/26/18 0442 03/26/18 1217  BP: 140/77 (!) 131/117 (!) 147/84 (!) 147/85  Pulse: 96 92 92 96  Resp: 18 16  16   Temp: 98.8 F (37.1 C) 98.2 F (36.8 C)  98.3 F (36.8 C)  TempSrc: Oral Oral  Oral  SpO2: 100% 100%  100%  Weight:      Height:        Intake/Output Summary (Last 24 hours) at 03/26/2018 1500 Last data filed at 03/26/2018 [id number] Gross per 24 hour  Intake 1118.54 ml  Output -  Net 1118.54 ml   Filed Weights   03/24/18 2159  Weight: 61.7 kg (136 lb 0.4 oz)    Exam:  . General: 57 y.o. year-old male frail in no acute distress.  Alert and oriented x3. . Cardiovascular: Regular rate and rhythm with no rubs or gallops.  No thyromegaly or JVD noted.   Marland Kitchen. Respiratory: Clear to auscultation with no wheezes or rales. Good inspiratory effort. . Abdomen: Soft nontender nondistended with normal bowel sounds x4 quadrants. . Musculoskeletal: No lower extremity edema. 2/4 pulses in all 4 extremities. . Skin: No ulcerative lesions noted or rashes.  Dry lower extremity skin. Marland Kitchen. Psychiatry: Mood is appropriate for condition and setting   Data Reviewed: CBC: Recent Labs  Lab 03/24/18 2245 03/26/18 0354  WBC 17.9* 17.2*  HGB 12.0* 12.5*  HCT 37.4* 37.5*  MCV 89.5 88.4  PLT 569* 529*   Basic Metabolic Panel: Recent Labs  Lab 03/24/18 2245 03/26/18 0354  NA 136 135  K 3.3* 2.8*  CL 99* 101  CO2 26 26  GLUCOSE 81 132*  BUN 8 <5*  CREATININE 0.83 0.70  CALCIUM 8.6* 8.4*   GFR: Estimated Creatinine Clearance: 88.9 mL/min (by  C-G formula based on SCr of 0.7 mg/dL). Liver Function Tests: Recent Labs  Lab 03/24/18 2245 03/26/18 0354  AST 13* 12*  ALT 11* 9*  ALKPHOS 52 49  BILITOT 0.7 0.5  PROT 6.7 6.7  ALBUMIN 2.4* 2.3*   No results for input(s): LIPASE, AMYLASE in the last 168 hours. No results for input(s): AMMONIA in the last 168 hours. Coagulation Profile: Recent Labs  Lab 03/25/18 0841  INR  1.21   Cardiac Enzymes: No results for input(s): CKTOTAL, CKMB, CKMBINDEX, TROPONINI in the last 168 hours. BNP (last 3 results) No results for input(s): PROBNP in the last 8760 hours. HbA1C: No results for input(s): HGBA1C in the last 72 hours. CBG: No results for input(s): GLUCAP in the last 168 hours. Lipid Profile: No results for input(s): CHOL, HDL, LDLCALC, TRIG, CHOLHDL, LDLDIRECT in the last 72 hours. Thyroid Function Tests: No results for input(s): TSH, T4TOTAL, FREET4, T3FREE, THYROIDAB in the last 72 hours. Anemia Panel: No results for input(s): VITAMINB12, FOLATE, FERRITIN, TIBC, IRON, RETICCTPCT in the last 72 hours. Urine analysis: No results found for: COLORURINE, APPEARANCEUR, LABSPEC, PHURINE, GLUCOSEU, HGBUR, BILIRUBINUR, KETONESUR, PROTEINUR, UROBILINOGEN, NITRITE, LEUKOCYTESUR Sepsis Labs: @LABRCNTIP (procalcitonin:4,lacticidven:4)  ) Recent Results (from the past 240 hour(s))  Surgical PCR screen     Status: None   Collection Time: 03/25/18  2:02 AM  Result Value Ref Range Status   MRSA, PCR NEGATIVE NEGATIVE Final   Staphylococcus aureus NEGATIVE NEGATIVE Final    Comment: (NOTE) The Xpert SA Assay (FDA approved for NASAL specimens in patients 71 years of age and older), is one component of a comprehensive surveillance program. It is not intended to diagnose infection nor to guide or monitor treatment. Performed at [facility], [address], [place], [place] [address]       Studies: Ct Abdomen Limited Wo Contrast  Result Date: 03/25/2018 CLINICAL DATA:  Diverticular abscess. No percutaneous window evident on recent supine scanning. Percutaneous drainage requested. EXAM: CT ABDOMEN (LIMITED) ANESTHESIA/SEDATION: None PROCEDURE: Patient was placed in left lateral decubitus position and limited helical scanning through the abdomen and pelvis performed in hopes of finding a safe right lateral percutaneous window for drainage of the deep central  retroperitoneal abscess. FINDINGS: The multiloculated gas and fluid collection centrally in the retroperitoneum just below the level of the aortic bifurcation was again localized. Unfortunately, despite changes in patient position, no percutaneous window to approach the collection was available because of overlying bowel, ureter, and bone. IMPRESSION: 1. Because of overlying anatomy, there is no safe needle approach for percutaneous drainage of the patient's diverticular abscess. Electronically Signed   By: Corlis Leak M.D.   On: 03/25/2018 14:53    Scheduled Meds: . folic acid  1 mg Oral Daily  . heparin  5,000 Units Subcutaneous Q8H  . LORazepam  0-4 mg Intravenous Q6H   Followed by  . [START ON 03/28/2018] LORazepam  0-4 mg Intravenous Q12H  . multivitamin with minerals  1 tablet Oral Daily  . potassium chloride  40 mEq Oral Once  . thiamine  100 mg Oral Daily   Or  . thiamine  100 mg Intravenous Daily    Continuous Infusions: . dextrose 5 % and 0.45 % NaCl with KCl 20 mEq/L 75 mL/hr at 03/26/18 1042  . piperacillin-tazobactam (ZOSYN)  IV Stopped (03/26/18 1453)     LOS: 2 days     Darlin Drop, MD [facility] Pager [phone]  If 7PM-7AM, please contact night-coverage [email or web address] Password  [email or web address] 03/26/2018, 3:00 PM

## 2018-03-26 NOTE — Progress Notes (Addendum)
[facility] Progress Note     Subjective: CC: diverticulitis Patient denies abdominal pain this AM, denies nausea. When he does have abdominal pain he feels it in the LLQ. States he is passing flatus and has had multiple episodes of diarrhea overnight. Patient tells me he has a history of alcohol abuse but has never experienced alcohol withdrawal. He states he has no other medical problems. Patient suddenly started telling me about when he was a child and wore a belt and played sports.  HTN overnight. Afebrile, not tachycardic.   Objective: Vital signs in last 24 hours: Temp:  [98.2 F (36.8 C)-98.8 F (37.1 C)] 98.2 F (36.8 C) (06/17 0441) Pulse Rate:  [92-97] 92 (06/17 0442) Resp:  [16-26] 16 (06/17 0441) BP: (131-147)/(70-117) 147/84 (06/17 0442) SpO2:  [100 %] 100 % (06/17 0441) Last BM Date: 03/24/18  Intake/Output from previous day: 06/16 0701 - 06/17 0700 In: 1118.5 [P.O.:120; I.V.:846.3; IV Piggyback:152.3] Out: -  Intake/Output this shift: No intake/output data recorded.  PE: Gen:  NAD, pleasant Eyes: EOMI, sclera icteric bilaterally  Card:  Regular rate and rhythm, pedal pulses 2+ BL Pulm:  Normal effort, clear to auscultation bilaterally Abd: Soft, TTP in LLQ, non-distended, bowel sounds hyperactive, no HSM Skin: warm and dry  Lab Results:  Recent Labs    03/24/18 2245 03/26/18 0354  WBC 17.9* 17.2*  HGB 12.0* 12.5*  HCT 37.4* 37.5*  PLT 569* 529*   BMET Recent Labs    03/24/18 2245 03/26/18 0354  NA 136 135  K 3.3* 2.8*  CL 99* 101  CO2 26 26  GLUCOSE 81 132*  BUN 8 <5*  CREATININE 0.83 0.70  CALCIUM 8.6* 8.4*   PT/INR Recent Labs    03/25/18 0841  LABPROT 15.2  INR 1.21   CMP     Component Value Date/Time   NA 135 03/26/2018 0354   K 2.8 (L) 03/26/2018 0354   CL 101 03/26/2018 0354   CO2 26 03/26/2018 0354   GLUCOSE 132 (H) 03/26/2018 0354   BUN <5 (L) 03/26/2018 0354   CREATININE 0.70 03/26/2018 0354   CALCIUM  8.4 (L) 03/26/2018 0354   PROT 6.7 03/26/2018 0354   ALBUMIN 2.3 (L) 03/26/2018 0354   AST 12 (L) 03/26/2018 0354   ALT 9 (L) 03/26/2018 0354   ALKPHOS 49 03/26/2018 0354   BILITOT 0.5 03/26/2018 0354   GFRNONAA >60 03/26/2018 0354   GFRAA >60 03/26/2018 0354   Lipase  No results found for: LIPASE     Studies/Results: Ct Abdomen Pelvis W Contrast  Result Date: 03/25/2018 CLINICAL DATA:  Abdominal pain. Fever. Sigmoid colon perforation secondary to diverticulitis. Suspicion of abscess. EXAM: CT ABDOMEN AND PELVIS WITH CONTRAST TECHNIQUE: Multidetector CT imaging of the abdomen and pelvis was performed using the standard protocol following bolus administration of intravenous contrast. CONTRAST:  100mL OMNIPAQUE IOHEXOL 300 MG/ML  SOLN COMPARISON:  None. FINDINGS: Lower chest: Emphysema. Subsegmental atelectasis or scarring at both lung bases. Normal heart size without pericardial or pleural effusion. Hepatobiliary: Too small to characterize right liver lobe lesion on image 16/3. Focal steatosis adjacent the falciform ligament. Normal gallbladder, without biliary ductal dilatation. Pancreas: Normal, without mass or ductal dilatation. Spleen: Normal in size, without focal abnormality. Adrenals/Urinary Tract: Normal adrenal glands. Left renal too small to characterize lesions. Normal right kidney, without hydronephrosis or hydroureter. Normal urinary bladder. Stomach/Bowel: The stomach is primarily decompressed and underdistended. Relatively mild diverticulosis centered in the sigmoid. Wall thickening with relatively localized edema  and a macrolobulated complex fluid collection or collections within the mesocolon. This is peripherally enhancing and measures on the order of 8.1 x 6.4 cm on image 47/3. 6.5 cm craniocaudal on sagittal image 90/7. No free perforation. Normal terminal ileum and appendix. Normal small bowel. Vascular/Lymphatic: Aortic and branch vessel atherosclerosis. No abdominopelvic  adenopathy. Reproductive: Normal prostate. Other: No significant free fluid. There is mild presacral interstitial thickening which is nonspecific and may track from the more cephalad pelvic process. Musculoskeletal: Degenerate disc disease at the lumbosacral junction. IMPRESSION: 1. Complicated sigmoid diverticulitis. Macrolobulated complex fluid collection or collections within the sigmoid mesocolon, most consistent with phlegmon or developing abscess/abscesses. 2.  Aortic Atherosclerosis (ICD10-I70.0). These results will be called to the ordering clinician or representative by the Radiologist Assistant, and communication documented in the PACS or zVision Dashboard. Electronically Signed   By: Jeronimo Greaves M.D.   On: 03/25/2018 12:57   Ct Abdomen Limited Wo Contrast  Result Date: 03/25/2018 CLINICAL DATA:  Diverticular abscess. No percutaneous window evident on recent supine scanning. Percutaneous drainage requested. EXAM: CT ABDOMEN (LIMITED) ANESTHESIA/SEDATION: None PROCEDURE: Patient was placed in left lateral decubitus position and limited helical scanning through the abdomen and pelvis performed in hopes of finding a safe right lateral percutaneous window for drainage of the deep central retroperitoneal abscess. FINDINGS: The multiloculated gas and fluid collection centrally in the retroperitoneum just below the level of the aortic bifurcation was again localized. Unfortunately, despite changes in patient position, no percutaneous window to approach the collection was available because of overlying bowel, ureter, and bone. IMPRESSION: 1. Because of overlying anatomy, there is no safe needle approach for percutaneous drainage of the patient's diverticular abscess. Electronically Signed   By: Corlis Leak M.D.   On: 03/25/2018 14:53    Anti-infectives: Anti-infectives (From admission, onward)   Start     Dose/Rate Route Frequency Ordered Stop   03/25/18 1000  piperacillin-tazobactam (ZOSYN) IVPB 3.375 g      3.375 g 12.5 mL/hr over 240 Minutes Intravenous Every 8 hours 03/25/18 0950     03/24/18 2215  piperacillin-tazobactam (ZOSYN) IVPB 3.375 g     3.375 g 100 mL/hr over 30 Minutes Intravenous  Once 03/24/18 2206 03/24/18 2341       Assessment/Plan EtOH abuse - last drink reportedly 2 weeks ago, CIWA  Acute diverticulitis with pericolonic abscess - CT 6/16: macrolobulated complex fluid collection consistent with phlegmon or developing abscess; peripherally enhancing 8x6 cm - appreciate IR evaluation, unfortunately no safe window for IR drainage - WBC 17.2, afebrile  - patient with mild TTP with deep palpation  - discussed trial of conservative management and surgery if conservative treatment fails Hypokalemia - K 2.8, replace IV and change IVF to include additional KCl   FEN: NPO w/ ice chips, IVF VTE: SCDs, SQ heparin  ID: IV zosyn 6/15>>    LOS: 2 days    Wells Guiles , [facility] 03/26/2018, 7:37 AM Pager: [phone] Consults: [phone] Mon-Fri 7:00 am-4:30 pm Sat-Sun 7:00 am-11:30 am

## 2018-03-27 ENCOUNTER — Other Ambulatory Visit: Payer: Self-pay

## 2018-03-27 ENCOUNTER — Encounter (HOSPITAL_COMMUNITY): Payer: Self-pay | Admitting: General Practice

## 2018-03-27 DIAGNOSIS — E43 Unspecified severe protein-calorie malnutrition: Secondary | ICD-10-CM

## 2018-03-27 LAB — BASIC METABOLIC PANEL
ANION GAP: 8 (ref 5–15)
BUN: <5 mg/dL — ABNORMAL LOW (ref 6–20)
CHLORIDE: 103 mmol/L (ref 101–111)
CO2: 25 mmol/L (ref 22–32)
CREATININE: 0.73 mg/dL (ref 0.61–1.24)
Calcium: 8.3 mg/dL — ABNORMAL LOW (ref 8.9–10.3)
GFR calc non Af Amer: >60 mL/min (ref >60)
Glucose, Bld: 114 mg/dL — ABNORMAL HIGH (ref 65–99)
Potassium: 3.3 mmol/L — ABNORMAL LOW (ref 3.5–5.1)
Sodium: 136 mmol/L (ref 135–145)

## 2018-03-27 LAB — PROTIME-INR
INR: 1.21
Prothrombin Time: 15.2 seconds (ref 11.4–15.2)

## 2018-03-27 LAB — CBC
HCT: 36.8 % — ABNORMAL LOW (ref 39.0–52.0)
HEMOGLOBIN: 12.1 g/dL — AB (ref 13.0–17.0)
MCH: 29.2 pg (ref 26.0–34.0)
MCHC: 32.9 g/dL (ref 30.0–36.0)
MCV: 88.9 fL (ref 78.0–100.0)
Platelets: 552 10*3/uL — ABNORMAL HIGH (ref 150–400)
RBC: 4.14 MIL/uL — AB (ref 4.22–5.81)
RDW: 13 % (ref 11.5–15.5)
WBC: 11.4 10*3/uL — ABNORMAL HIGH (ref 4.0–10.5)

## 2018-03-27 LAB — MAGNESIUM: Magnesium: 2.1 mg/dL (ref 1.7–2.4)

## 2018-03-27 MED ORDER — CIPROFLOXACIN HCL 500 MG PO TABS
500.0000 mg | ORAL_TABLET | Freq: Two times a day (BID) | ORAL | Status: DC
  Administered 2018-03-27 – 2018-03-28 (×2): 500 mg via ORAL
  Filled 2018-03-27 (×2): qty 1

## 2018-03-27 MED ORDER — METRONIDAZOLE 500 MG PO TABS
500.0000 mg | ORAL_TABLET | Freq: Two times a day (BID) | ORAL | Status: DC
  Administered 2018-03-27 – 2018-03-28 (×3): 500 mg via ORAL
  Filled 2018-03-27 (×3): qty 1

## 2018-03-27 MED ORDER — POTASSIUM CHLORIDE CRYS ER 20 MEQ PO TBCR
40.0000 meq | EXTENDED_RELEASE_TABLET | Freq: Two times a day (BID) | ORAL | Status: AC
  Administered 2018-03-27 (×2): 40 meq via ORAL
  Filled 2018-03-27 (×2): qty 2

## 2018-03-27 NOTE — Evaluation (Signed)
Physical Therapy Evaluation and Discharge Patient Details Name: Brian Dudley Dooley MRN: [id number] DOB: 06-Feb-1961 Today's Date: 03/27/2018   History of Present Illness  [name] is a 57 year old male with past medical history significant for alcohol use disorder and tobacco use disorder who presented to the ED with 2 weeks history of intermittent abdominal discomfort.  CT abdomen and pelvis with contrast done on 03/25/2018 revealed complicated sigmoid diverticulitis with suspected developing abscess/abscesses.  Clinical Impression  Pt admitted with above diagnosis. Pt functioning at baseline. Denies abdominal pain. Pt independent with all function. Pt plans on going home to daughters house her in [place] upon d/c and then will return to his home [place] [address] [place] with his mother. Pt with no further acute PT needs at this time. PT SIGNING OFF. Please re-consult if needed in future. Thank you.     Follow Up Recommendations No PT follow up    Equipment Recommendations  None recommended by PT    Recommendations for Other Services       Precautions / Restrictions Precautions Precautions: None Restrictions Weight Bearing Restrictions: No      Mobility  Bed Mobility Overal bed mobility: Independent                Transfers Overall transfer level: Independent Equipment used: None             General transfer comment: no difficulty  Ambulation/Gait Ambulation/Gait assistance: Independent Gait Distance (Feet): 600 Feet Assistive device: None Gait Pattern/deviations: WFL(Within Functional Limits) Gait velocity: wfl Gait velocity interpretation: >4.37 ft/sec, indicative of normal walking speed General Gait Details: no episodes of LOB  Stairs Stairs: Yes Stairs assistance: Modified independent (Device/Increase time) Stair Management: One rail Right;Alternating pattern;Forwards Number of Stairs: 4 General stair comments: limited by IV pole, no  difficulty  Wheelchair Mobility    Modified Rankin (Stroke Patients Only)       Balance Overall balance assessment: Mild deficits observed, not formally tested                                           Pertinent Vitals/Pain Pain Assessment: No/denies pain    Home Living Family/patient expects to be discharged to:: Private residence Living Arrangements: Parent Available Help at Discharge: Family;Available PRN/intermittently Type of Home: House Home Access: Stairs to enter Entrance Stairs-Rails: Right Entrance Stairs-Number of Steps: 3 Home Layout: One level Home Equipment: None Additional Comments: is going to stay with daughter her in [place] for a couple of days but [place] lives with [place]    Prior Function Level of Independence: Independent         Comments: was working     Higher education careers adviserHand Dominance   Dominant Hand: Right    Extremity/Trunk Assessment   Upper Extremity Assessment Upper Extremity Assessment: Overall WFL for tasks assessed    Lower Extremity Assessment Lower Extremity Assessment: Overall WFL for tasks assessed    Cervical / Trunk Assessment Cervical / Trunk Assessment: Normal  Communication   Communication: No difficulties  Cognition Arousal/Alertness: Awake/alert Behavior During Therapy: WFL for tasks assessed/performed Overall Cognitive Status: Within Functional Limits for tasks assessed                                        General Comments General comments (  skin integrity, edema, etc.): VSS    Exercises     Assessment/Plan    PT Assessment Patent does not need any further PT services  PT Problem List         PT Treatment Interventions      PT Goals (Current goals can be found in the Care Plan section)  Acute Rehab PT Goals Patient Stated Goal: eat real food PT Goal Formulation: All assessment and education complete, DC therapy    Frequency     Barriers to discharge         Co-evaluation               AM-PAC PT "6 Clicks" Daily Activity  Outcome Measure Difficulty turning over in bed (including adjusting bedclothes, sheets and blankets)?: None Difficulty moving from lying on back to sitting on the side of the bed? : None Difficulty sitting down on and standing up from a chair with arms (e.g., wheelchair, bedside commode, etc,.)?: None Help needed moving to and from a bed to chair (including a wheelchair)?: None Help needed walking in hospital room?: None Help needed climbing 3-5 steps with a railing? : None 6 Click Score: 24    End of Session   Activity Tolerance: Patient tolerated treatment well Patient left: in bed;with call bell/phone within reach Nurse Communication: Mobility status PT Visit Diagnosis: Other abnormalities of gait and mobility (R26.89)    Time: 4696-2952 PT Time Calculation (min) (ACUTE ONLY): 16 min   Charges:   PT Evaluation $PT Eval Low Complexity: 1 Low     PT G CodesLewis Shock, PT, DPT Pager #: [phone] Office #: [phone]    M  03/27/2018, 1:14 PM

## 2018-03-27 NOTE — Plan of Care (Signed)
Nutrition Education Note  RD consulted for nutrition education regarding nutrition management for diverticulosis/ Diverticulitis.   Case discussed with RN, who reports pt will likely discharge home today.   Spoke with pt, who reports he generally has a good appetite, however, decreased appetite PTA due to abdominal pain. Pt reports good tolerance of liquids and solid foods for lunch. He is eager to go home and eat "a triple decker peanut butter sandwich".   RD provided "Fiber Restricted Nutrition Therapy" handout from the Academy of [employer]. Reviewed patient's dietary recall and discussed ways for pt to meet nutrition goals over the next several weeks. Explained reasons for pt to follow a low fiber diet over the next 6-8 weeks. Reviewed low fiber foods and high fiber foods. Discussed best practice for long term management of diverticulosis is a high fiber diet and discussed ways to gradually increase fiber in the diet.   Teach back method used. Pt verbalizes understanding of information provided.   Expect fair to good compliance.  Body mass index is Body mass index is 21.95 kg/m.Marland Kitchen. Pt meets criteria for normal weight range based on current BMI.  Current diet order is soft, patient is consuming approximately 100% of meals at this time. Labs and medications reviewed. No further nutrition interventions warranted at this time. RD contact information provided. If additional nutrition issues arise, please re-consult RD.   A. Mayford KnifeWilliams, RD, LDN, CDE Pager: [phone] After hours Pager: [phone]

## 2018-03-27 NOTE — Progress Notes (Signed)
Patient ID: Brian Dudley, male   DOB: May 16, 1961, 57 y.o.   MRN: [id number]       Subjective: Pt feels well today.  No pain.  Tolerating clear liquids  Objective: Vital signs in last 24 hours: Temp:  [97.7 F (36.5 C)-98.7 F (37.1 C)] 97.7 F (36.5 C) (06/18 0545) Pulse Rate:  [89-98] 89 (06/18 0545) Resp:  [16-18] 17 (06/18 0545) BP: (139-155)/(79-85) 140/80 (06/18 0545) SpO2:  [99 %-100 %] 100 % (06/18 0545) Last BM Date: 03/26/18  Intake/Output from previous day: 06/17 0701 - 06/18 0700 In: 2316.3 [P.O.:240; I.V.:1426.3; IV Piggyback:650] Out: -  Intake/Output this shift: No intake/output data recorded.  PE: Heart: regular Lungs: CTAB, some upper airway sounds Abd: soft, NT, ND, +BS  Lab Results:  Recent Labs    03/26/18 0354 03/27/18 0644  WBC 17.2* 11.4*  HGB 12.5* 12.1*  HCT 37.5* 36.8*  PLT 529* 552*   BMET Recent Labs    03/26/18 0354 03/27/18 0644  NA 135 136  K 2.8* 3.3*  CL 101 103  CO2 26 25  GLUCOSE 132* 114*  BUN <5* <5*  CREATININE 0.70 0.73  CALCIUM 8.4* 8.3*   PT/INR Recent Labs    03/25/18 0841 03/27/18 0644  LABPROT 15.2 15.2  INR 1.21 1.21   CMP     Component Value Date/Time   NA 136 03/27/2018 0644   K 3.3 (L) 03/27/2018 0644   CL 103 03/27/2018 0644   CO2 25 03/27/2018 0644   GLUCOSE 114 (H) 03/27/2018 0644   BUN <5 (L) 03/27/2018 0644   CREATININE 0.73 03/27/2018 0644   CALCIUM 8.3 (L) 03/27/2018 0644   PROT 6.7 03/26/2018 0354   ALBUMIN 2.3 (L) 03/26/2018 0354   AST 12 (L) 03/26/2018 0354   ALT 9 (L) 03/26/2018 0354   ALKPHOS 49 03/26/2018 0354   BILITOT 0.5 03/26/2018 0354   GFRNONAA >60 03/27/2018 0644   GFRAA >60 03/27/2018 0644   Lipase  No results found for: LIPASE     Studies/Results: Ct Abdomen Pelvis W Contrast  Result Date: 03/25/2018 CLINICAL DATA:  Abdominal pain. Fever. Sigmoid colon perforation secondary to diverticulitis. Suspicion of abscess. EXAM: CT ABDOMEN AND PELVIS WITH CONTRAST  TECHNIQUE: Multidetector CT imaging of the abdomen and pelvis was performed using the standard protocol following bolus administration of intravenous contrast. CONTRAST:  OMNIPAQUE IOHEXOL 300 MG/ML  SOLN COMPARISON:  None. FINDINGS: Lower chest: Emphysema. Subsegmental atelectasis or scarring at both lung bases. Normal heart size without pericardial or pleural effusion. Hepatobiliary: Too small to characterize right liver lobe lesion on image 16/3. Focal steatosis adjacent the falciform ligament. Normal gallbladder, without biliary ductal dilatation. Pancreas: Normal, without mass or ductal dilatation. Spleen: Normal in size, without focal abnormality. Adrenals/Urinary Tract: Normal adrenal glands. Left renal too small to characterize lesions. Normal right kidney, without hydronephrosis or hydroureter. Normal urinary bladder. Stomach/Bowel: The stomach is primarily decompressed and underdistended. Relatively mild diverticulosis centered in the sigmoid. Wall thickening with relatively localized edema and a macrolobulated complex fluid collection or collections within the mesocolon. This is peripherally enhancing and measures on the order of 8.1 x 6.4 cm on image 47/3. 6.5 cm craniocaudal on sagittal image 90/7. No free perforation. Normal terminal ileum and appendix. Normal small bowel. Vascular/Lymphatic: Aortic and branch vessel atherosclerosis. No abdominopelvic adenopathy. Reproductive: Normal prostate. Other: No significant free fluid. There is mild presacral interstitial thickening which is nonspecific and may track from the more cephalad pelvic process. Musculoskeletal: Degenerate disc disease at  the lumbosacral junction. IMPRESSION: 1. Complicated sigmoid diverticulitis. Macrolobulated complex fluid collection or collections within the sigmoid mesocolon, most consistent with phlegmon or developing abscess/abscesses. 2.  Aortic Atherosclerosis (ICD10-I70.0). These results will be called to the ordering  clinician or representative by the Radiologist Assistant, and communication documented in the PACS or zVision Dashboard. Electronically Signed   By: Jeronimo GreavesKyle  Talbot M.D.   On: 03/25/2018 12:57   Ct Abdomen Limited Wo Contrast  Result Date: 03/25/2018 CLINICAL DATA:  Diverticular abscess. No percutaneous window evident on recent supine scanning. Percutaneous drainage requested. EXAM: CT ABDOMEN (LIMITED) ANESTHESIA/SEDATION: None PROCEDURE: Patient was placed in left lateral decubitus position and limited helical scanning through the abdomen and pelvis performed in hopes of finding a safe right lateral percutaneous window for drainage of the deep central retroperitoneal abscess. FINDINGS: The multiloculated gas and fluid collection centrally in the retroperitoneum just below the level of the aortic bifurcation was again localized. Unfortunately, despite changes in patient position, no percutaneous window to approach the collection was available because of overlying bowel, ureter, and bone. IMPRESSION: 1. Because of overlying anatomy, there is no safe needle approach for percutaneous drainage of the patient's diverticular abscess. Electronically Signed   By: Corlis Leak  Hassell M.D.   On: 03/25/2018 14:53    Anti-infectives: Anti-infectives (From admission, onward)   Start     Dose/Rate Route Frequency Ordered Stop   03/25/18 1000  piperacillin-tazobactam (ZOSYN) IVPB 3.375 g     3.375 g 12.5 mL/hr over 240 Minutes Intravenous Every 8 hours 03/25/18 0950     03/24/18 2215  piperacillin-tazobactam (ZOSYN) IVPB 3.375 g     3.375 g 100 mL/hr over 30 Minutes Intravenous  Once 03/24/18 2206 03/24/18 2341       Assessment/Plan EtOH abuse - last drink reportedly 2 weeks ago, CIWA  Acute diverticulitis with pericolonic abscess - CT 6/16: macrolobulated complex fluid collection consistent with phlegmon or developing abscess; peripherally enhancing 8x6 cm - appreciate IR evaluation, unfortunately no safe window  for IR drainage - WBC 11K, afebrile today - advance to full liquids. -dietician consult for low fiber diet and high fiber diet education. Hypokalemia - K 3.3, replace orally today  FEN: full liquids, SLIV VTE: SCDs, SQ heparin  ID: IV zosyn 6/15>>   LOS: 3 days    Letha CapeKelly E  , [facility] 03/27/2018, 7:51 AM Pager: [phone]

## 2018-03-27 NOTE — Progress Notes (Addendum)
PROGRESS NOTE  Bonny Egger EXB:[id number] DOB: 20-Nov-1960 DOA: 03/24/2018 PCP: Patient, No Pcp Per  HPI/Recap of past 24 hours: Mr. [name] is a 57 year old male with past medical history significant for alcohol use disorder and tobacco use disorder who presented to the ED with 2 weeks history of intermittent abdominal discomfort.  CT abdomen and pelvis with contrast done on 03/25/2018 revealed complicated sigmoid diverticulitis with suspected developing abscess/abscesses.  Admitted for acute sigmoid diverticulitis complicated by suspected abscesses.  GI consulted and following.  03/26/2018: Patient seen and examined at bedside.  He denies any abdominal pain or nausea.  He is eating a clear liquid diet and tolerated well.  Admits to flatulence.  03/27/18: Patient seen and examined at his bedside.  He has no new complaints.  States he had a bowel movement today.  Denies any abdominal pain or nausea.  General surgery following and recommends outpatient CT abdomen and pelvis with contrast repeated in 7 to 10 days.  Advanced his diet to soft.  Continue to monitor for any changes.  Possible discharge tomorrow 03/28/2018 if no events overnight.  Assessment/Plan: Active Problems:   Perforation of sigmoid colon due to diverticulitis   Colonic diverticular abscess  Acute complicated sigmoid diverticulitis with suspicion for phlegmon Not amenable to drainage per interventional radiology General surgery consulted and following.  Highly appreciated. Afebrile in the past 48 hours White count is trending down from 17 K to 11 K Advance diet today 03/27/2018 with soft- monitor for any changes Denies abdominal pain and is having bowel movements CT abdomen and pelvis with contrast done on 03/25/2018 General surgery recommends repeating CT in 7 to 10 days by April 04, 2018 Possible discharge 03/28/2018 if no events overnight Stop IV Zosyn Start p.o. Cipro and p.o. Flagyl for discharge planning  Persistent  hypokalemia but improving Potassium 3.3 from 2.8 yesterday  Magnesium 2.1 Repleted with p.o. potassium Repeat BMP in the morning   Severe protein calorie malnutrition Albumin 2.3 BMI 21 Encourage increased p.o. protein calorie intake PT to assess for any needs at discharge  Alcohol use disorder with concern for withdrawal Continue CIWA protocol Possible discharge on 03/28/2018 if no events overnight  Diverticulosis Follow-up with GI outpatient  Tobacco use disorder Nicotine patch as needed  Code Status: Full code  Family Communication: None at bedside  Disposition Plan: Home when clinically stable, possibly tomorrow 03/28/2018 if he tolerates a soft or regular diet.   Consultants:  Surgery  Interventional radiology  Procedures:  None  Antimicrobials:  Zosyn  DVT prophylaxis: Subcu heparin 3 times daily   Objective: Vitals:   03/26/18 1217 03/26/18 1840 03/26/18 2214 03/27/18 0545  BP: (!) 147/85 139/79 (!) 155/82 140/80  Pulse: 96 97 98 89  Resp: 16 16 18 17   Temp: 98.3 F (36.8 C) 98.4 F (36.9 C) 98.7 F (37.1 C) 97.7 F (36.5 C)  TempSrc: Oral Oral Oral Oral  SpO2: 100% 100% 99% 100%  Weight:      Height:        Intake/Output Summary (Last 24 hours) at 03/27/2018 1216 Last data filed at 03/27/2018 0820 Gross per 24 hour  Intake 2436.25 ml  Output -  Net 2436.25 ml   Filed Weights   03/24/18 2159  Weight: 61.7 kg (136 lb 0.4 oz)    Exam:  . General: 57 y.o. year-old male frail in no acute distress.  Alert and oriented x3. . Cardiovascular: Regular rate and rhythm with no rubs or gallops.  No thyromegaly  or JVD present. Marland Kitchen Respiratory: Clear to auscultation with no wheezes or rales. Good inspiratory effort. . Abdomen: Soft nontender nondistended with normal bowel sounds x4 quadrants . Musculoskeletal: No lower extremity edema. 2/4 pulses in all 4 extremities. . Skin: No ulcerative lesions noted or rashes.  Dry lower extremity  skin. Marland Kitchen Psychiatry: Mood is appropriate for condition and setting   Data Reviewed: CBC: Recent Labs  Lab 03/24/18 2245 03/26/18 0354 03/27/18 0644  WBC 17.9* 17.2* 11.4*  HGB 12.0* 12.5* 12.1*  HCT 37.4* 37.5* 36.8*  MCV 89.5 88.4 88.9  PLT 569* 529* 552*   Basic Metabolic Panel: Recent Labs  Lab 03/24/18 2245 03/26/18 0354 03/27/18 0644  NA 136 135 136  K 3.3* 2.8* 3.3*  CL 99* 101 103  CO2 26 26 25   GLUCOSE 81 132* 114*  BUN 8 <5* <5*  CREATININE 0.83 0.70 0.73  CALCIUM 8.6* 8.4* 8.3*  MG  --   --  2.1   GFR: Estimated Creatinine Clearance: 88.9 mL/min (by C-G formula based on SCr of 0.73 mg/dL). Liver Function Tests: Recent Labs  Lab 03/24/18 2245 03/26/18 0354  AST 13* 12*  ALT 11* 9*  ALKPHOS 52 49  BILITOT 0.7 0.5  PROT 6.7 6.7  ALBUMIN 2.4* 2.3*   No results for input(s): LIPASE, AMYLASE in the last 168 hours. No results for input(s): AMMONIA in the last 168 hours. Coagulation Profile: Recent Labs  Lab 03/25/18 0841 03/27/18 0644  INR 1.21 1.21   Cardiac Enzymes: No results for input(s): CKTOTAL, CKMB, CKMBINDEX, TROPONINI in the last 168 hours. BNP (last 3 results) No results for input(s): PROBNP in the last 8760 hours. HbA1C: No results for input(s): HGBA1C in the last 72 hours. CBG: No results for input(s): GLUCAP in the last 168 hours. Lipid Profile: No results for input(s): CHOL, HDL, LDLCALC, TRIG, CHOLHDL, LDLDIRECT in the last 72 hours. Thyroid Function Tests: No results for input(s): TSH, T4TOTAL, FREET4, T3FREE, THYROIDAB in the last 72 hours. Anemia Panel: No results for input(s): VITAMINB12, FOLATE, FERRITIN, TIBC, IRON, RETICCTPCT in the last 72 hours. Urine analysis: No results found for: COLORURINE, APPEARANCEUR, LABSPEC, PHURINE, GLUCOSEU, HGBUR, BILIRUBINUR, KETONESUR, PROTEINUR, UROBILINOGEN, NITRITE, LEUKOCYTESUR Sepsis Labs: @LABRCNTIP (procalcitonin:4,lacticidven:4)  ) Recent Results (from the past 240 hour(s))   Surgical PCR screen     Status: None   Collection Time: 03/25/18  2:02 AM  Result Value Ref Range Status   MRSA, PCR NEGATIVE NEGATIVE Final   Staphylococcus aureus NEGATIVE NEGATIVE Final    Comment: (NOTE) The Xpert SA Assay (FDA approved for NASAL specimens in patients 56 years of age and older), is one component of a comprehensive surveillance program. It is not intended to diagnose infection nor to guide or monitor treatment. Performed at [facility], [address], [place], [place] [address]       Studies: No results found.  Scheduled Meds: . folic acid  1 mg Oral Daily  . heparin  5,000 Units Subcutaneous Q8H  . LORazepam  0-4 mg Intravenous Q6H   Followed by  . [START ON 03/28/2018] LORazepam  0-4 mg Intravenous Q12H  . multivitamin with minerals  1 tablet Oral Daily  . potassium chloride  40 mEq Oral BID  . thiamine  100 mg Oral Daily   Or  . thiamine  100 mg Intravenous Daily    Continuous Infusions: . dextrose 5 % and 0.45 % NaCl with KCl 20 mEq/L 75 mL/hr at 03/27/18 0255  . piperacillin-tazobactam (ZOSYN)  IV 3.375 g (03/27/18 1019)     LOS: 3 days     Darlin Droparole N , MD [facility] Pager [phone]  If 7PM-7AM, please contact night-coverage [email or web address] Password [email or web address] 03/27/2018, 12:16 PM

## 2018-03-27 NOTE — Plan of Care (Signed)
  Problem: Pain Managment: Goal: General experience of comfort will improve Outcome: Progressing   Problem: Safety: Goal: Ability to remain free from injury will improve Outcome: Progressing   

## 2018-03-28 DIAGNOSIS — K5720 Diverticulitis of large intestine with perforation and abscess without bleeding: Principal | ICD-10-CM

## 2018-03-28 LAB — BASIC METABOLIC PANEL
Anion gap: 6 (ref 5–15)
CHLORIDE: 107 mmol/L (ref 101–111)
CO2: 24 mmol/L (ref 22–32)
Calcium: 9 mg/dL (ref 8.9–10.3)
Creatinine, Ser: 0.77 mg/dL (ref 0.61–1.24)
Glucose, Bld: 90 mg/dL (ref 65–99)
POTASSIUM: 4.1 mmol/L (ref 3.5–5.1)
SODIUM: 137 mmol/L (ref 135–145)

## 2018-03-28 LAB — CBC
HCT: 40.8 % (ref 39.0–52.0)
HEMOGLOBIN: 13 g/dL (ref 13.0–17.0)
MCH: 29 pg (ref 26.0–34.0)
MCHC: 31.9 g/dL (ref 30.0–36.0)
MCV: 90.9 fL (ref 78.0–100.0)
Platelets: 635 10*3/uL — ABNORMAL HIGH (ref 150–400)
RBC: 4.49 MIL/uL (ref 4.22–5.81)
RDW: 13.2 % (ref 11.5–15.5)
WBC: 8.6 10*3/uL (ref 4.0–10.5)

## 2018-03-28 MED ORDER — FOLIC ACID 1 MG PO TABS: 1.0000 mg | ORAL_TABLET | Freq: Every day | ORAL | 0 refills | Status: AC

## 2018-03-28 MED ORDER — METRONIDAZOLE 500 MG PO TABS: 500.0000 mg | ORAL_TABLET | Freq: Three times a day (TID) | ORAL | 0 refills | Status: DC

## 2018-03-28 MED ORDER — POTASSIUM CHLORIDE ER 10 MEQ PO TBCR: 10.0000 meq | EXTENDED_RELEASE_TABLET | Freq: Every day | ORAL | 0 refills | Status: DC

## 2018-03-28 MED ORDER — LACTINEX PO CHEW: 1.0000 | CHEWABLE_TABLET | Freq: Three times a day (TID) | ORAL | 0 refills | Status: DC

## 2018-03-28 MED ORDER — ADULT MULTIVITAMIN W/MINERALS CH: 1.0000 | ORAL_TABLET | Freq: Every day | ORAL | 0 refills | Status: AC

## 2018-03-28 MED ORDER — THIAMINE HCL 100 MG PO TABS: 100.0000 mg | ORAL_TABLET | Freq: Every day | ORAL | 0 refills | Status: DC

## 2018-03-28 MED ORDER — CIPROFLOXACIN HCL 500 MG PO TABS: 500.0000 mg | ORAL_TABLET | Freq: Two times a day (BID) | ORAL | 0 refills | Status: DC

## 2018-03-28 MED ORDER — ACETAMINOPHEN 325 MG PO TABS: 650.0000 mg | ORAL_TABLET | Freq: Four times a day (QID) | ORAL | Status: DC | PRN

## 2018-03-28 NOTE — Discharge Instructions (Signed)

## 2018-03-28 NOTE — Care Management Note (Signed)
Case Management Note  Patient Details  Name: Theresa Dutyhomas Boatman MRN: [id number] Date of Birth: [birth year]  Subjective/Objective:                    Action/Plan:   Expected Discharge Date:  03/28/18               Expected Discharge Plan:  Home/Self Care  In-House Referral:  Financial Counselor  Discharge planning Services  CM Consult, Medication Assistance, MATCH Program  Post Acute Care Choice:  NA Choice offered to:  Patient  DME Arranged:  N/A DME Agency:  NA  HH Arranged:  NA HH Agency:  NA  Status of Service:  Completed, signed off  If discussed at Long Length of Stay Meetings, dates discussed:    Additional Comments:  Kingsley PlanWile,  Marie, RN 03/28/2018, 10:38 AM

## 2018-03-28 NOTE — Progress Notes (Signed)
   Subjective/Chief Complaint: No abdominal pain bowels moving    Objective: Vital signs in last 24 hours: Temp:  [98 F (36.7 C)-98.2 F (36.8 C)] 98 F (36.7 C) (06/19 0525) Pulse Rate:  [88-90] 88 (06/19 0525) Resp:  [16-17] 16 (06/19 0525) BP: (151-170)/(86-95) 156/91 (06/19 0525) SpO2:  [100 %] 100 % (06/19 0525) Last BM Date: 03/27/18  Intake/Output from previous day: 06/18 0701 - 06/19 0700 In: 984 [P.O.:984] Out: -  Intake/Output this shift: No intake/output data recorded.  GI: soft, non-tender; bowel sounds normal; no masses,  no organomegaly  Lab Results:  Recent Labs    03/27/18 0644 03/28/18 0622  WBC 11.4* 8.6  HGB 12.1* 13.0  HCT 36.8* 40.8  PLT 552* 635*   BMET Recent Labs    03/27/18 0644 03/28/18 0622  NA 136 137  K 3.3* 4.1  CL 103 107  CO2 25 24  GLUCOSE 114* 90  BUN <5* <5*  CREATININE 0.73 0.77  CALCIUM 8.3* 9.0   PT/INR Recent Labs    03/25/18 0841 03/27/18 0644  LABPROT 15.2 15.2  INR 1.21 1.21   ABG No results for input(s): PHART, HCO3 in the last 72 hours.  Invalid input(s): PCO2, PO2  Studies/Results: No results found.  Anti-infectives: Anti-infectives (From admission, onward)   Start     Dose/Rate Route Frequency Ordered Stop   03/27/18 2000  ciprofloxacin (CIPRO) tablet 500 mg     500 mg Oral 2 times daily 03/27/18 1230 04/03/18 1959   03/27/18 1245  metroNIDAZOLE (FLAGYL) tablet 500 mg     500 mg Oral Every 12 hours 03/27/18 1230 03/30/18 2159   03/25/18 1000  piperacillin-tazobactam (ZOSYN) IVPB 3.375 g  Status:  Discontinued     3.375 g 12.5 mL/hr over 240 Minutes Intravenous Every 8 hours 03/25/18 0950 03/27/18 1230   03/24/18 2215  piperacillin-tazobactam (ZOSYN) IVPB 3.375 g     3.375 g 100 mL/hr over 30 Minutes Intravenous  Once 03/24/18 2206 03/24/18 2341      Assessment/Plan:  EtOH abuse - last drinkreportedly2 weeks ago,CIWA  Acute diverticulitis with pericolonic abscess - CT 6/16:  macrolobulated complex fluid collection consistent with phlegmon or developing abscess; peripherally enhancing 8x6 cm - appreciate IR evaluation, unfortunately no safe window for IR drainage - WBC 8.6K, afebriletoday - advance to soft low residue  -dietician consult for low fiber diet and high fiber diet education. Hypokalemia-improved   FEN:  soft  Diet , SLIV VTE: SCDs, SQ heparin  ID: IV zosyn 6/15>>  OK to discharge  Need 10 days TOTAL of ABX augmentin ok to use or cipro/flagyl   Follow up as outpatient at [facility] on 2 weeks  Can see colorectal surgeon Cliffton Asters(White, Gross or Maisie Fushomas)   No acute need for surgery     LOS: 4 days    Clovis Puhomas A  03/28/2018

## 2018-03-28 NOTE — Discharge Summary (Signed)
Brian Dudley, is a 57 y.o. male  DOB [birth year]  MRN [id number].  Admission date:  03/24/2018  Admitting Physician  Lahoma Crockerheresa C Sheehan, MD  Discharge Date:  03/28/2018   Primary MD  Patient, No Pcp Per  Recommendations for primary care physician for things to follow:  -Please check CBC, BMP during next visit. -Patient to follow with general surgery as an outpatient within 7 to 10 days, will need repeat CT abdomen pelvis before follow-up, [facility] will arrange for CT and follow-up   Admission Diagnosis  Diverticulitis with abscess   Discharge Diagnosis  Diverticulitis with abscess    Active Problems:   Perforation of sigmoid colon due to diverticulitis   Colonic diverticular abscess      Past Medical History:  Diagnosis Date  . Diverticulitis     Past Surgical History:  Procedure Laterality Date  . TONSILLECTOMY         History of present illness and  Hospital Course:     Kindly see H&P for history of present illness and admission details, please review complete Labs, Consult reports and Test reports for all details in brief  HPI  from the history and physical done on the day of admission   Brian Dudley  is a 57 y.o. male, with past medical history significant for alcoholism and smoking presenting with 2 weeks history of on and off abdominal discomfort after a bout of alcoholism according to [facility].  He was admitted yesterday after he started developing fever, malaise and suprapubic pain, and was found to have leukocytosis with perforated sigmoid diverticulitis and a 7 cm abscess close to the urinary bladder.  Patient was admitted and started on IV Zosyn and his white blood cell count improved and was sent here to be evaluated by IR and surgery Dr. Gaynelle AduEric Wilson for management.  His last alcoholic drink was 2 weeks ago and the patient has no signs of  withdrawal.      Hospital Course   Mr. Brian MeuseStevens is a 57 year old male with past medical history significant for alcohol use disorder and tobacco use disorder who presented to the ED with 2 weeks history of intermittent abdominal discomfort.  CT abdomen and pelvis with contrast done on 03/25/2018 revealed complicated sigmoid diverticulitis with suspected developing abscess/abscesses.  Admitted for acute sigmoid diverticulitis complicated by suspected abscesses.  General surgery consulted and following.   Acute complicated sigmoid diverticulitis with pericolonic abscess - Not amenable to drainage per interventional radiology, he was kept on IV Zosyn during hospital stay, he is afebrile over last 72 hours, leukocytosis has resolved, tolerating soft diet as well, input greatly appreciated, no plan for surgical intervention today, the abscess is not amendable to drainage per IR, plan is to discharge for total of 10 days of oral Cipro and Flagyl, he is to follow with general surgeon as an outpatient with repeat CT abdomen pelvis which they will arrange outpatient setting.    Persistent hypokalemia but improving -Repleted, will discharge and supplements  Severe protein calorie malnutrition Albumin 2.3 BMI 21 Encourage increased p.o. protein calorie intake  Alcohol use disorder with concern for withdrawal -On CIWA protocol during hospital stay, no evidence of withdrawals  Diverticulosis Follow-up with GI outpatient  Tobacco use disorder Nicotine patch as needed     Discharge Condition:  Stable   Follow UP  Follow-up Information    [facility]. Schedule an appointment as soon as possible for a visit.   Contact information: [address] [place] [address] [phone]       Brian Meuse, MD Follow up.   Specialty:  General Surgery Why:  our office will call you with follow up CT appointment and appointment in  the office Contact information: [address] [place] [address] [phone]             Discharge Instructions  and  Discharge Medications     Discharge Instructions    Discharge instructions   Complete by:  As directed    Follow with Primary MD Patient,  in 7 days , and to follow with general surgery as an outpatient  Get CBC, CMP, checked  by Primary MD next visit.    Activity: As tolerated with Full fall precautions use walker/cane & assistance as needed   Disposition Home    Diet: Soft Diet For Heart failure patients - Check your Weight same time everyday, if you gain over 2 pounds, or you develop in leg swelling, experience more shortness of breath or chest pain, call your Primary MD immediately. Follow Cardiac Low Salt Diet and 1.5 lit/day fluid restriction.   On your next visit with your primary care physician please Get Medicines reviewed and adjusted.   Please request your Prim.MD to go over all Hospital Tests and Procedure/Radiological results at the follow up, please get all Hospital records sent to your Prim MD by signing hospital release before you go home.   If you experience worsening of your admission symptoms, develop shortness of breath, life threatening emergency, suicidal or homicidal thoughts you must seek medical attention immediately by calling 911 or calling your MD immediately  if symptoms less severe.  You Must read complete instructions/literature along with all the possible adverse reactions/side effects for all the Medicines you take and that have been prescribed to you. Take any new Medicines after you have completely understood and accpet all the possible adverse reactions/side effects.   Do not drive, operating heavy machinery, perform activities at heights, swimming or participation in water activities or provide baby sitting services if your were admitted for syncope or siezures until you have seen by Primary MD or a Neurologist and  advised to do so again.  Do not drive when taking Pain medications.    Do not take more than prescribed Pain, Sleep and Anxiety Medications  Special Instructions: If you have smoked or chewed Tobacco  in the last 2 yrs please stop smoking, stop any regular Alcohol  and or any Recreational drug use.  Wear Seat belts while driving.   Please note  You were cared for by a hospitalist during your hospital stay. If you have any questions about your discharge medications or the care you received while you were in the hospital after you are discharged, you can call the unit and asked to speak with the hospitalist on call if the hospitalist that took care of you is not available. Once you are discharged, your primary care physician will handle  any further medical issues. Please note that NO REFILLS for any discharge medications will be authorized once you are discharged, as it is imperative that you return to your primary care physician (or establish a relationship with a primary care physician if you do not have one) for your aftercare needs so that they can reassess your need for medications and monitor your lab values.   Increase activity slowly   Complete by:  As directed      Allergies as of 03/28/2018   Not on File     Medication List    TAKE these medications   acetaminophen 325 MG tablet Commonly known as:  TYLENOL Take 2 tablets (650 mg total) by mouth every 6 (six) hours as needed for mild pain (or Fever >/= 101).   ciprofloxacin 500 MG tablet Commonly known as:  CIPRO Take 1 tablet (500 mg total) by mouth 2 (two) times daily.   folic acid 1 MG tablet Commonly known as:  FOLVITE Take 1 tablet (1 mg total) by mouth daily. Start taking on:  03/29/2018   lactobacillus acidophilus & bulgar chewable tablet Chew 1 tablet by mouth 3 (three) times daily with meals.   metroNIDAZOLE 500 MG tablet Commonly known as:  FLAGYL Take 1 tablet (500 mg total) by mouth 3 (three) times  daily.   multivitamin with minerals Tabs tablet Take 1 tablet by mouth daily. Start taking on:  03/29/2018   potassium chloride 10 MEQ tablet Commonly known as:  K-DUR Take 1 tablet (10 mEq total) by mouth daily.   thiamine 100 MG tablet Take 1 tablet (100 mg total) by mouth daily. Start taking on:  03/29/2018         Diet and Activity recommendation: See Discharge Instructions above   Consults obtained   Surgery  Interventional radiology      Major procedures and Radiology Reports - PLEASE review detailed and final reports for all details, in brief -      Ct Abdomen Pelvis W Contrast  Result Date: 03/25/2018 CLINICAL DATA:  Abdominal pain. Fever. Sigmoid colon perforation secondary to diverticulitis. Suspicion of abscess. EXAM: CT ABDOMEN AND PELVIS WITH CONTRAST TECHNIQUE: Multidetector CT imaging of the abdomen and pelvis was performed using the standard protocol following bolus administration of intravenous contrast. CONTRAST:  OMNIPAQUE IOHEXOL 300 MG/ML  SOLN COMPARISON:  None. FINDINGS: Lower chest: Emphysema. Subsegmental atelectasis or scarring at both lung bases. Normal heart size without pericardial or pleural effusion. Hepatobiliary: Too small to characterize right liver lobe lesion on image 16/3. Focal steatosis adjacent the falciform ligament. Normal gallbladder, without biliary ductal dilatation. Pancreas: Normal, without mass or ductal dilatation. Spleen: Normal in size, without focal abnormality. Adrenals/Urinary Tract: Normal adrenal glands. Left renal too small to characterize lesions. Normal right kidney, without hydronephrosis or hydroureter. Normal urinary bladder. Stomach/Bowel: The stomach is primarily decompressed and underdistended. Relatively mild diverticulosis centered in the sigmoid. Wall thickening with relatively localized edema and a macrolobulated complex fluid collection or collections within the mesocolon. This is peripherally enhancing  and measures on the order of 8.1 x 6.4 cm on image 47/3. 6.5 cm craniocaudal on sagittal image 90/7. No free perforation. Normal terminal ileum and appendix. Normal small bowel. Vascular/Lymphatic: Aortic and branch vessel atherosclerosis. No abdominopelvic adenopathy. Reproductive: Normal prostate. Other: No significant free fluid. There is mild presacral interstitial thickening which is nonspecific and may track from the more cephalad pelvic process. Musculoskeletal: Degenerate disc disease at the lumbosacral junction. IMPRESSION: 1. Complicated sigmoid diverticulitis.  Macrolobulated complex fluid collection or collections within the sigmoid mesocolon, most consistent with phlegmon or developing abscess/abscesses. 2.  Aortic Atherosclerosis (ICD10-I70.0). These results will be called to the ordering clinician or representative by the Radiologist Assistant, and communication documented in the PACS or zVision Dashboard. Electronically Signed   By: Jeronimo Greaves M.D.   On: 03/25/2018 12:57   Ct Abdomen Limited Wo Contrast  Result Date: 03/25/2018 CLINICAL DATA:  Diverticular abscess. No percutaneous window evident on recent supine scanning. Percutaneous drainage requested. EXAM: CT ABDOMEN (LIMITED) ANESTHESIA/SEDATION: None PROCEDURE: Patient was placed in left lateral decubitus position and limited helical scanning through the abdomen and pelvis performed in hopes of finding a safe right lateral percutaneous window for drainage of the deep central retroperitoneal abscess. FINDINGS: The multiloculated gas and fluid collection centrally in the retroperitoneum just below the level of the aortic bifurcation was again localized. Unfortunately, despite changes in patient position, no percutaneous window to approach the collection was available because of overlying bowel, ureter, and bone. IMPRESSION: 1. Because of overlying anatomy, there is no safe needle approach for percutaneous drainage of the patient's  diverticular abscess. Electronically Signed   By: Corlis Leak M.D.   On: 03/25/2018 14:53    Micro Results    Recent Results (from the past 240 hour(s))  Surgical PCR screen     Status: None   Collection Time: 03/25/18  2:02 AM  Result Value Ref Range Status   MRSA, PCR NEGATIVE NEGATIVE Final   Staphylococcus aureus NEGATIVE NEGATIVE Final    Comment: (NOTE) The Xpert SA Assay (FDA approved for NASAL specimens in patients 24 years of age and older), is one component of a comprehensive surveillance program. It is not intended to diagnose infection nor to guide or monitor treatment. Performed at [facility], [address], [place], [place] [address]        Today   Subjective:   Wyeth Hoffer today has no headache,no chest or abdominal pain,no new weakness tingling or numbness, feels much better wants to go home today.  No fever, no chills  Objective:   Blood pressure (!) 156/91, pulse 88, temperature 98 F (36.7 C), temperature source Oral, resp. rate 16, height 5\' 6"  (1.676 m), weight 61.7 kg (136 lb 0.4 oz), SpO2 100 %.   Intake/Output Summary (Last 24 hours) at 03/28/2018 1044 Last data filed at 03/28/2018 0223 Gross per 24 hour  Intake 744 ml  Output -  Net 744 ml    Exam Awake Alert, Oriented x 3, No new F.N deficits, Normal affect Symmetrical Chest wall movement, Good air movement bilaterally, CTAB RRR,No Gallops,Rubs or new Murmurs, No Parasternal Heave +ve B.Sounds, Abd Soft, Non tender,No rebound -guarding or rigidity. No Cyanosis, Clubbing or edema, No new Rash or bruise  Data Review   CBC w Diff:  Lab Results  Component Value Date   WBC 8.6 03/28/2018   HGB 13.0 03/28/2018   HCT 40.8 03/28/2018   PLT 635 (H) 03/28/2018    CMP:  Lab Results  Component Value Date   NA 137 03/28/2018   K 4.1 03/28/2018   CL 107 03/28/2018   CO2 24 03/28/2018   BUN <5 (L) 03/28/2018   CREATININE 0.77 03/28/2018   PROT 6.7 03/26/2018   ALBUMIN 2.3  (L) 03/26/2018   BILITOT 0.5 03/26/2018   ALKPHOS 49 03/26/2018   AST 12 (L) 03/26/2018   ALT 9 (L) 03/26/2018  .   Total Time in preparing paper work,  data evaluation and todays exam - 35 minutes  Huey Bienenstock M.D on 03/28/2018 at 10:44 AM  [facility]   Office  [phone]

## 2018-03-28 NOTE — Progress Notes (Signed)
Patient discharged to home  In stable condition. Discharge instructions and paperwork given. Daughter to transport patient home.

## 2018-04-02 ENCOUNTER — Other Ambulatory Visit (HOSPITAL_COMMUNITY): Payer: Self-pay | Admitting: Surgery

## 2018-04-02 DIAGNOSIS — K5792 Diverticulitis of intestine, part unspecified, without perforation or abscess without bleeding: Secondary | ICD-10-CM

## 2019-02-17 IMAGING — CT CT ABDOMEN LIMITED W/O CM
1 of 2 series · 14 of 32 positions shown, 20 images · non-contrast
Comparison: none

CLINICAL DATA: Diverticular abscess. No percutaneous window evident
on recent supine scanning. Percutaneous drainage requested.

[Series 2: i-spiral 5.0 b40f · axial · 0.63mm/px · z∈[+944,+1136]mm · 14 of 65 slices shown, 20 images]
[im 5/65  soft-tissue]
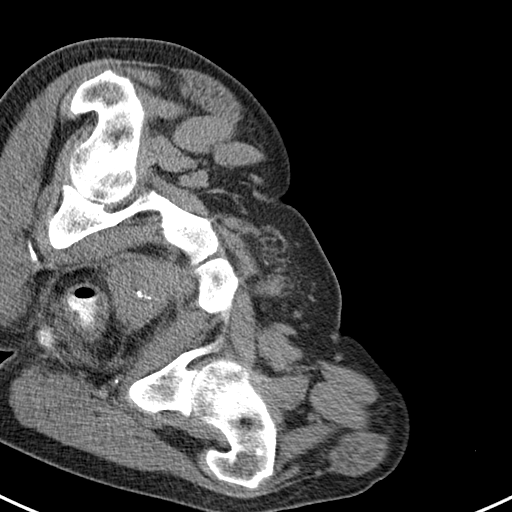
[im 5/65  bone]
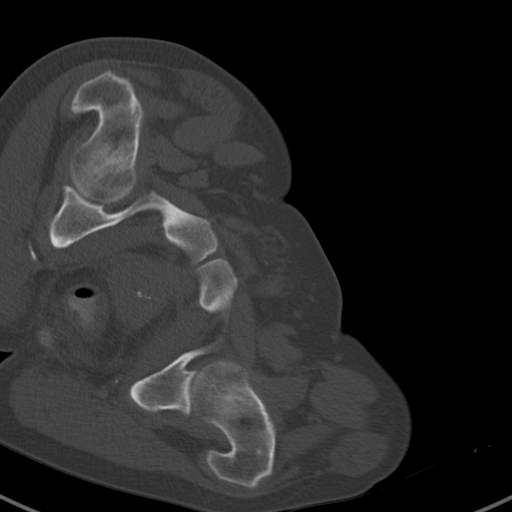
[im 9/65  soft-tissue]
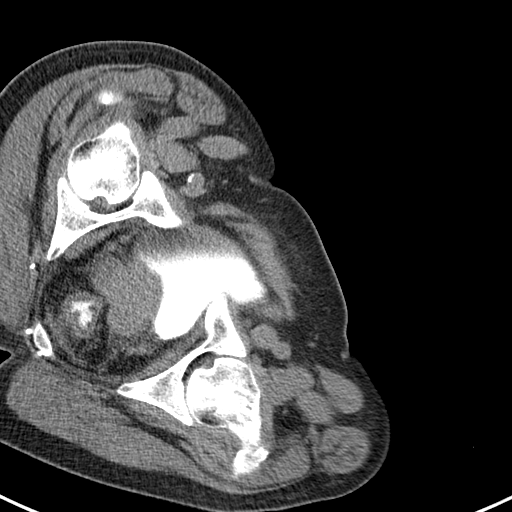
[im 13/65  soft-tissue]
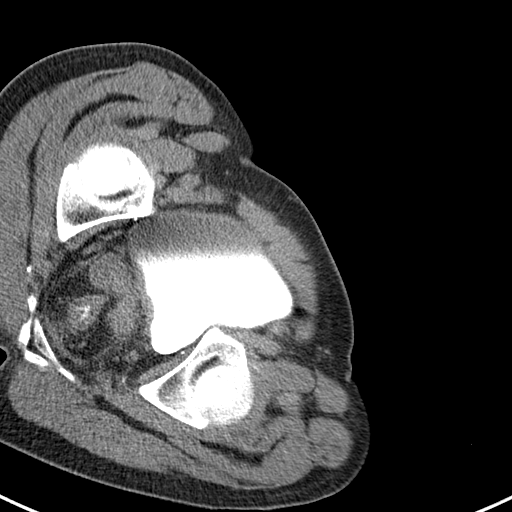
[im 18/65  soft-tissue]
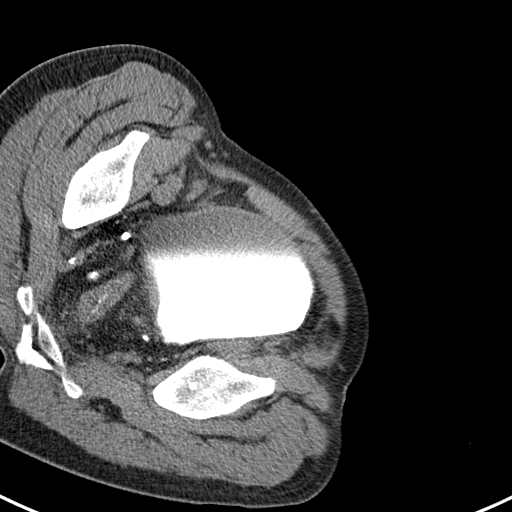
[im 22/65  soft-tissue]
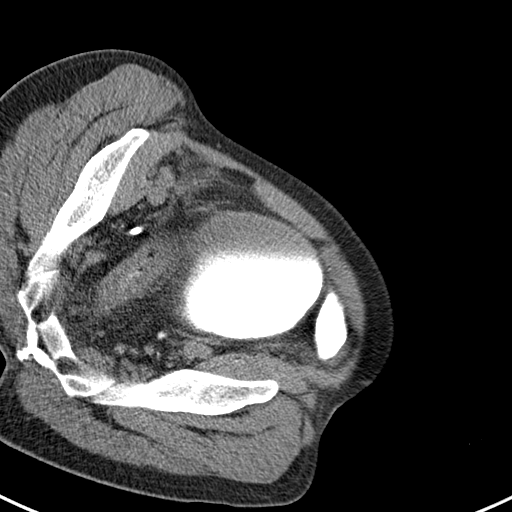
[im 26/65  soft-tissue]
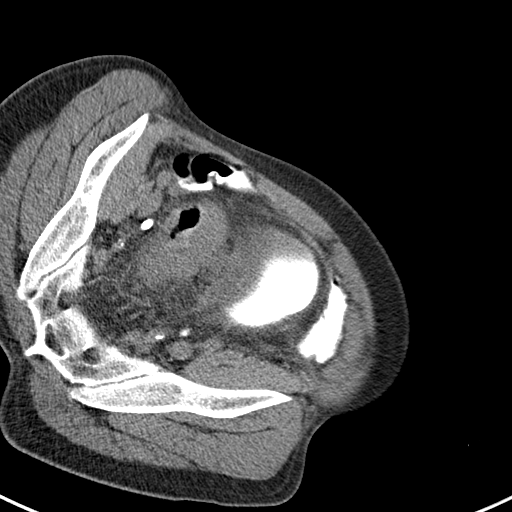
[im 30/65  soft-tissue]
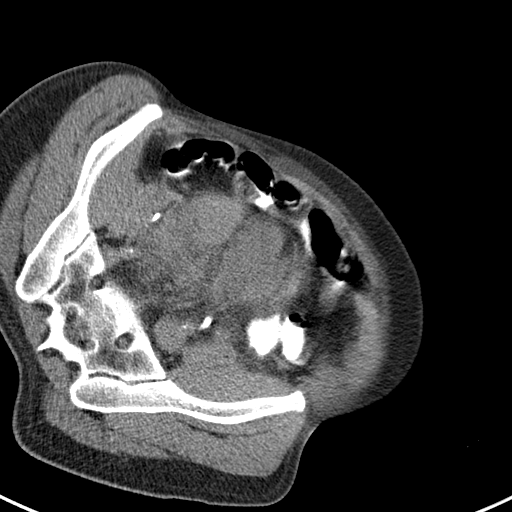
[im 35/65  soft-tissue]
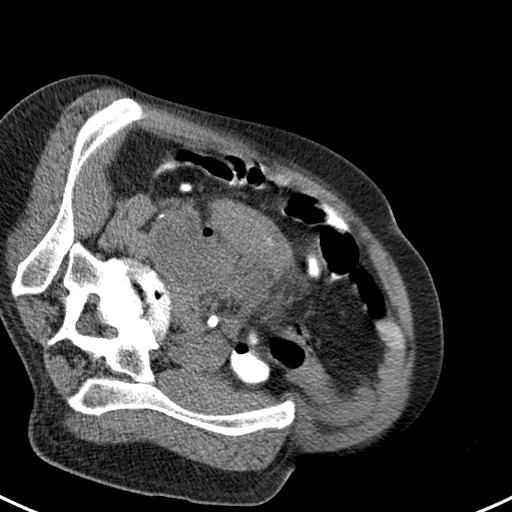
[im 39/65  soft-tissue]
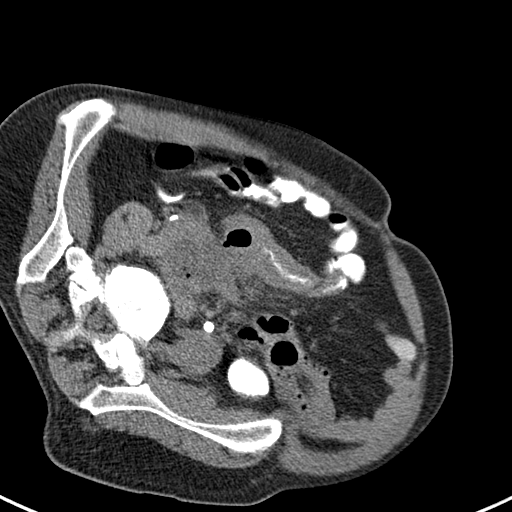
[im 39/65  bone]
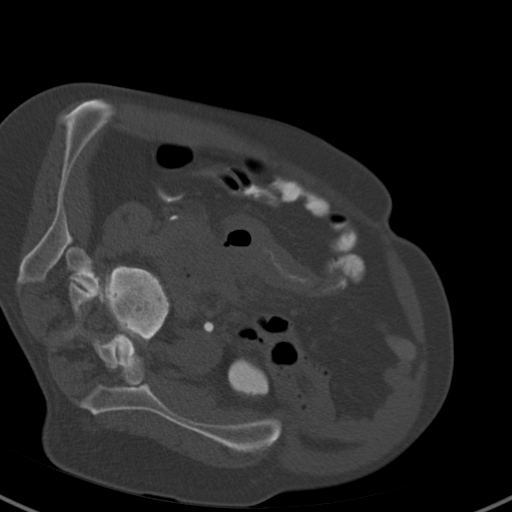
[im 43/65  soft-tissue]
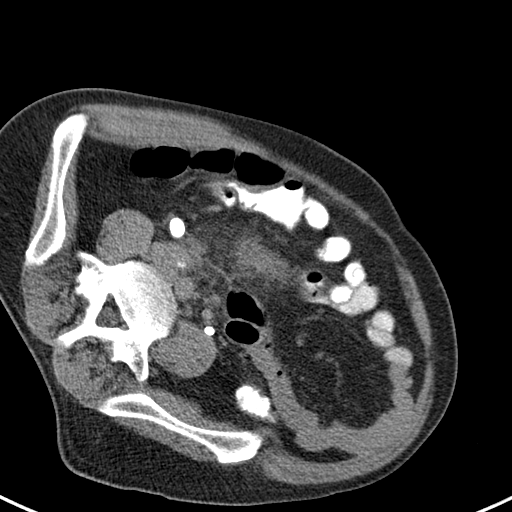
[im 47/65  soft-tissue]
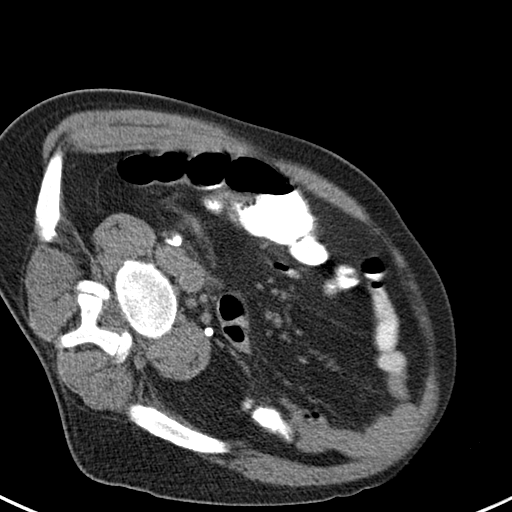
[im 47/65  lung]
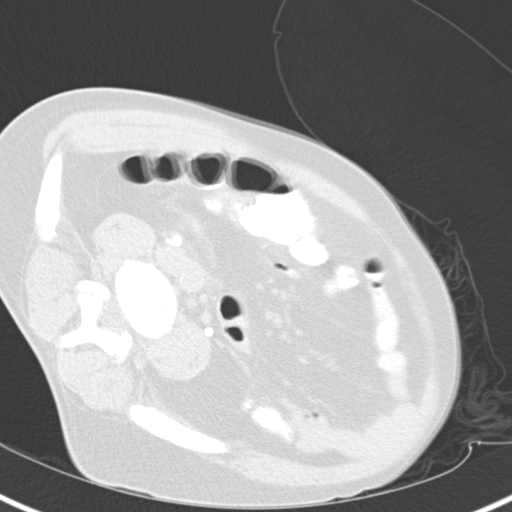
[im 52/65  soft-tissue]
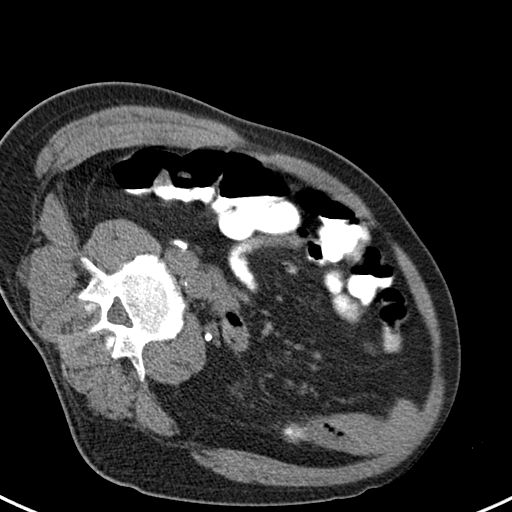
[im 52/65  lung]
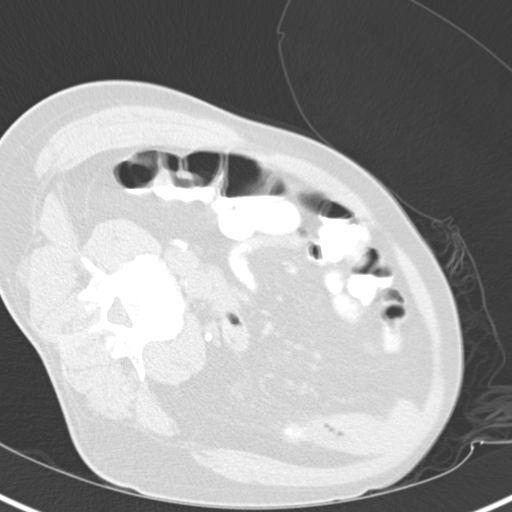
[im 56/65  soft-tissue]
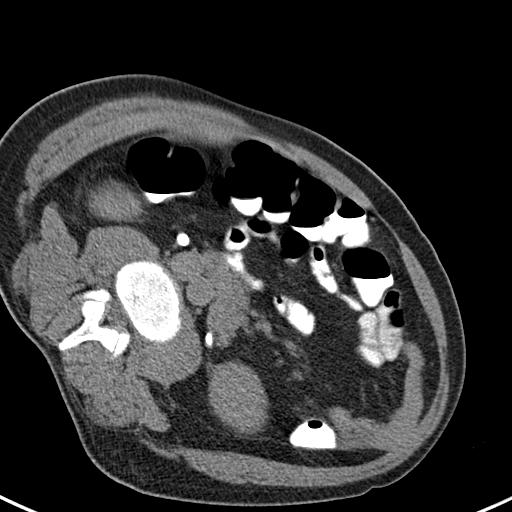
[im 56/65  lung]
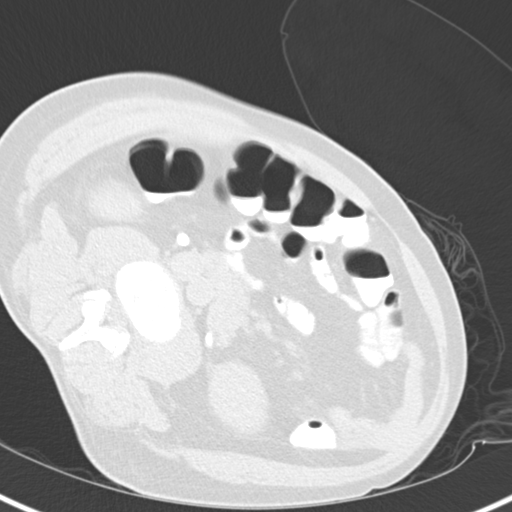
[im 60/65  soft-tissue]
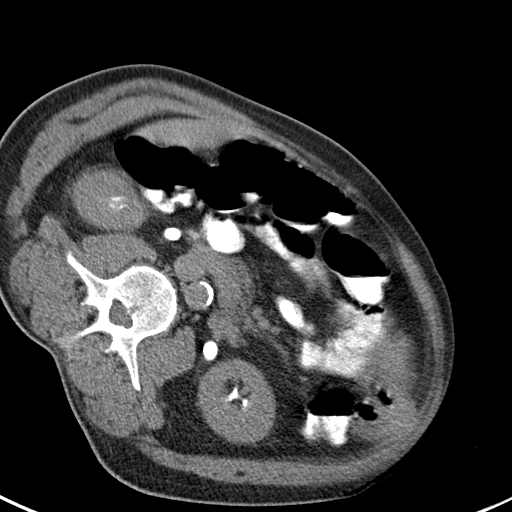
[im 60/65  lung]
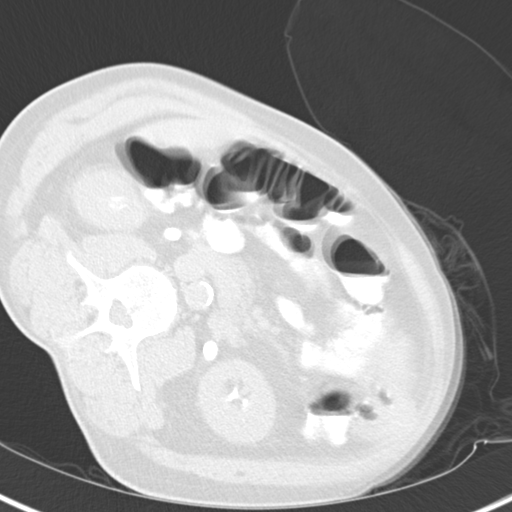

[14 of 32 positions shown; findings below may reference images not displayed]

EXAM:
CT ABDOMEN (LIMITED)

ANESTHESIA/SEDATION:
None

PROCEDURE:
Patient was placed in left lateral decubitus position and limited
helical scanning through the abdomen and pelvis performed in hopes
of finding a safe right lateral percutaneous window for drainage of
the deep central retroperitoneal abscess.
FINDINGS: The multiloculated gas and fluid collection centrally in the
retroperitoneum just below the level of the aortic bifurcation was
again localized. Unfortunately, despite changes in patient position,
no percutaneous window to approach the collection was available
because of overlying bowel, ureter, and bone.
IMPRESSION: 1. Because of overlying anatomy, there is no safe needle approach
for percutaneous drainage of the patient's diverticular abscess.

## 2019-02-17 IMAGING — CT CT ABD-PELV W/ CM
2 of 5 series · 16 of 46 positions shown, 18 images · IV contrast (omnipaque)
Comparison: None.

CLINICAL DATA: Abdominal pain. Fever. Sigmoid colon perforation
secondary to diverticulitis. Suspicion of abscess.

EXAM:
CT ABDOMEN AND PELVIS WITH CONTRAST
TECHNIQUE: Multidetector CT imaging of the abdomen and pelvis was performed
using the standard protocol following bolus administration of
intravenous contrast.
CONTRAST:  100mL OMNIPAQUE IOHEXOL 300 MG/ML  SOLN

[Series 3: a/p w/ 5mm · axial · 0.75mm/px · z∈[+892,+1252]mm · 13 of 81 slices shown, 15 images]
[im 5/81  soft-tissue]
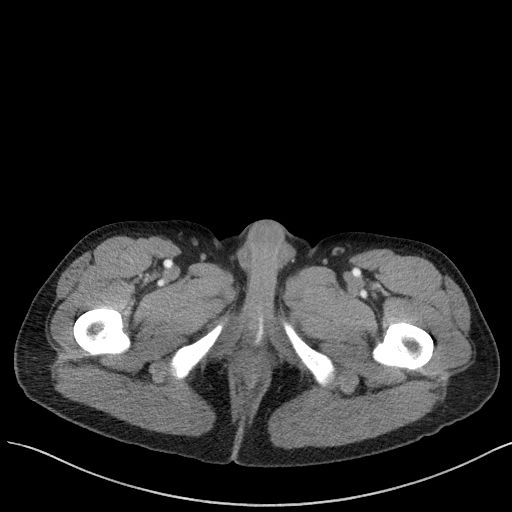
[im 5/81  bone]
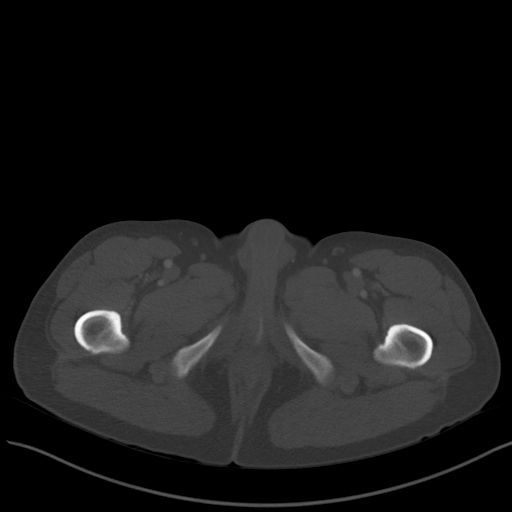
[im 13/81  soft-tissue]
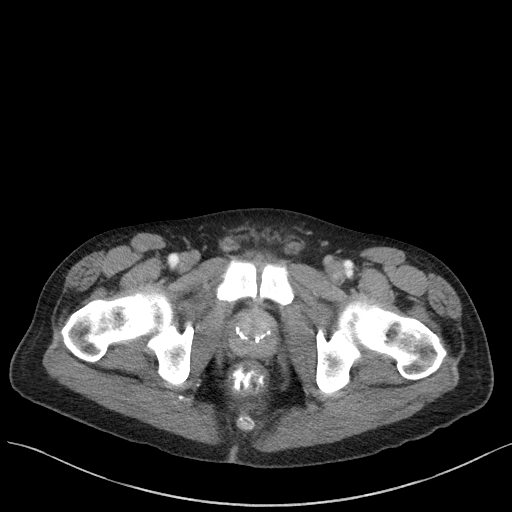
[im 17/81  soft-tissue]
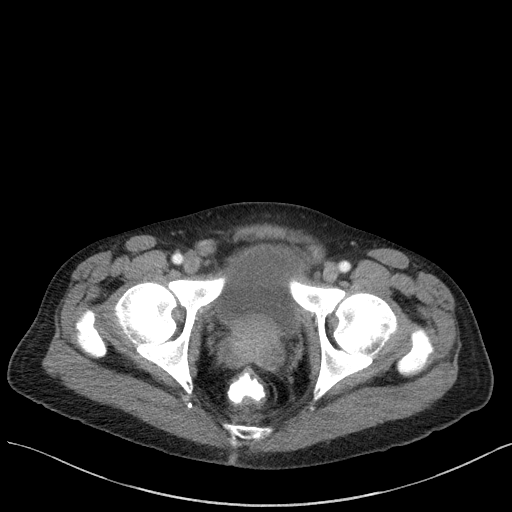
[im 25/81  soft-tissue]
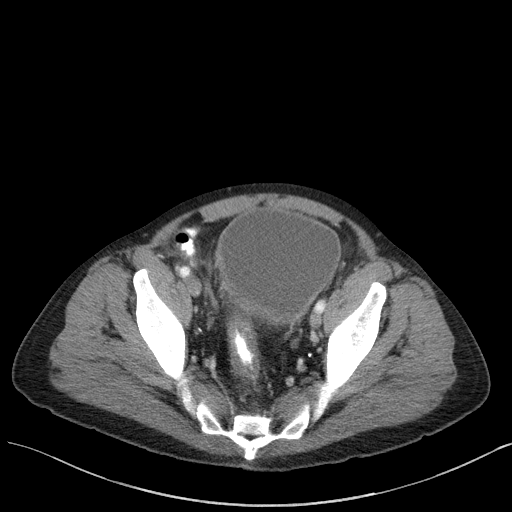
[im 29/81  soft-tissue]
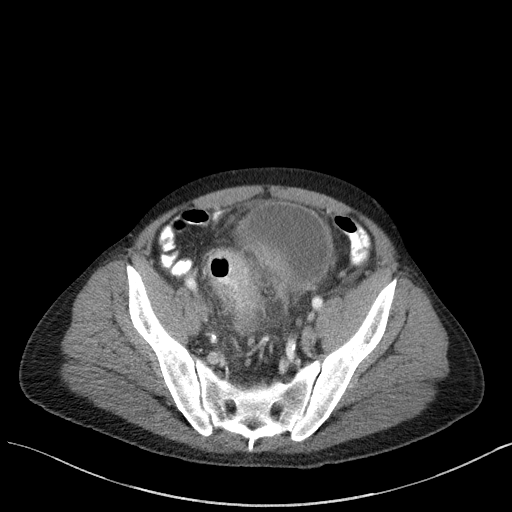
[im 37/81  soft-tissue]
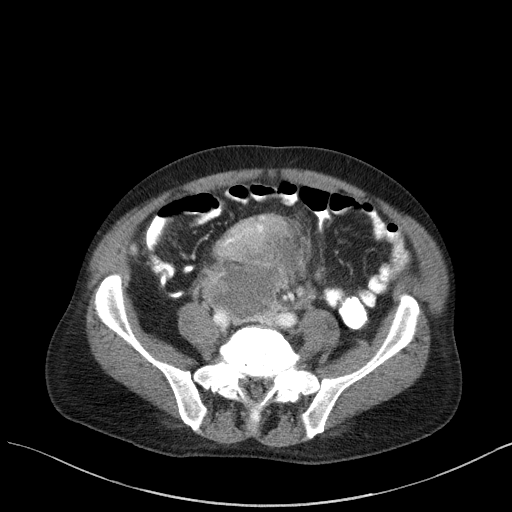
[im 41/81  soft-tissue]
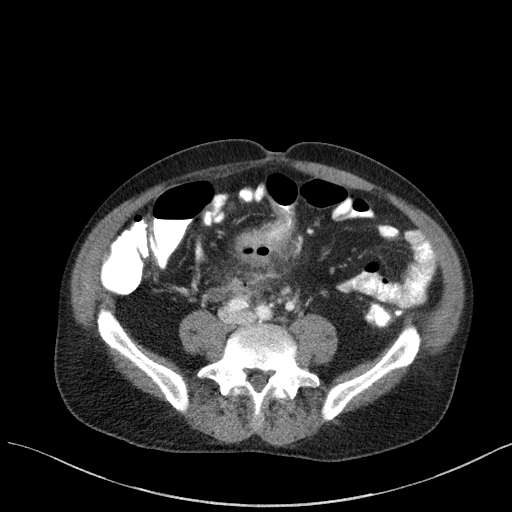
[im 45/81  soft-tissue]
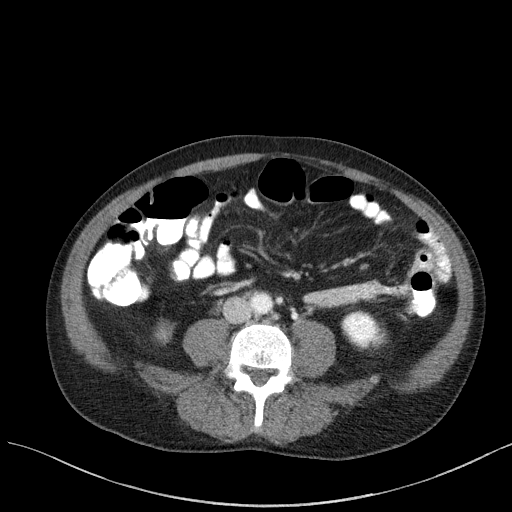
[im 53/81  soft-tissue]
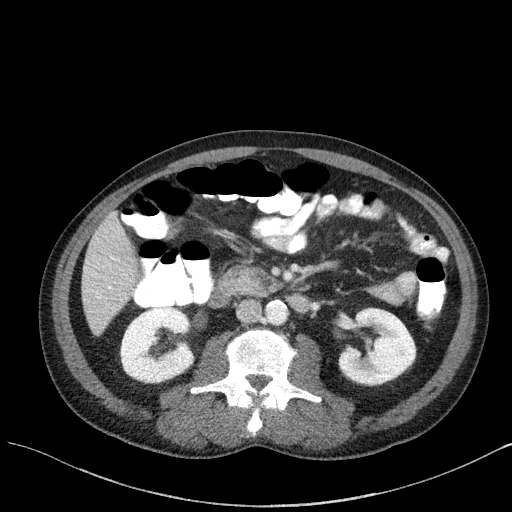
[im 53/81  bone]
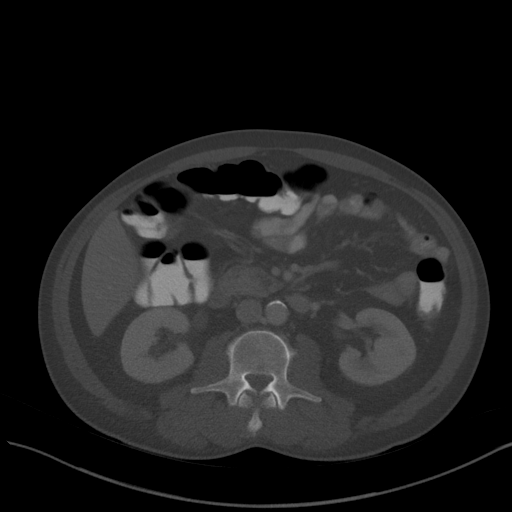
[im 57/81  soft-tissue]
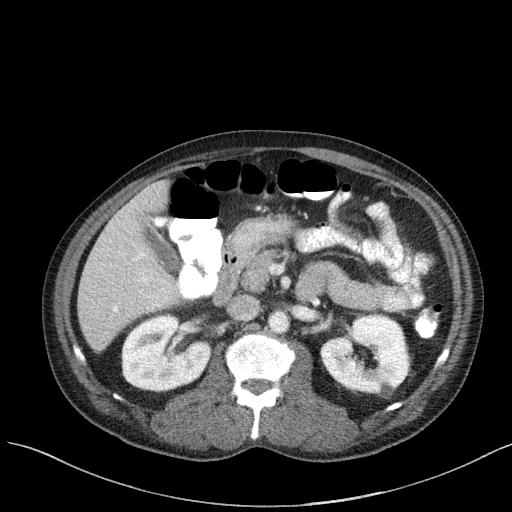
[im 65/81  soft-tissue]
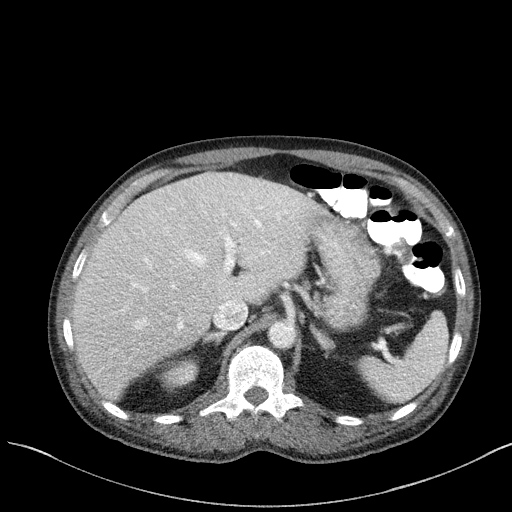
[im 69/81  soft-tissue]
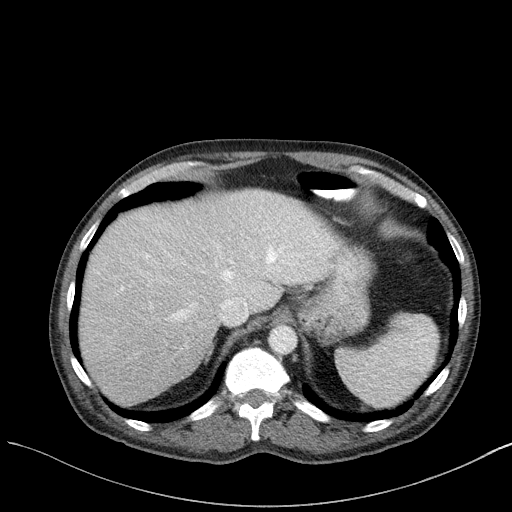
[im 77/81  soft-tissue]
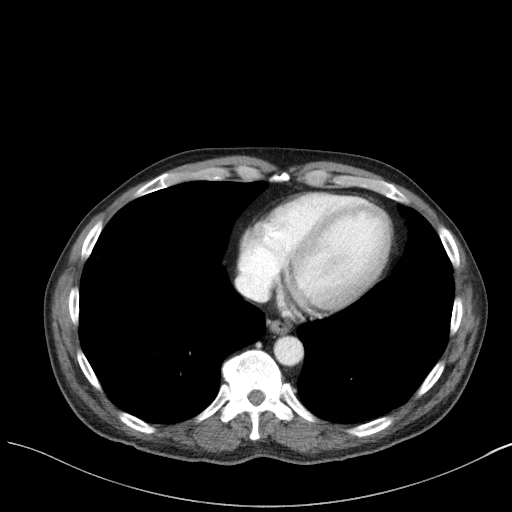

[Series 6: a/p w/ cor · coronal · 0.75mm/px · 3 of 151 slices shown]
[im 51/151  soft-tissue]
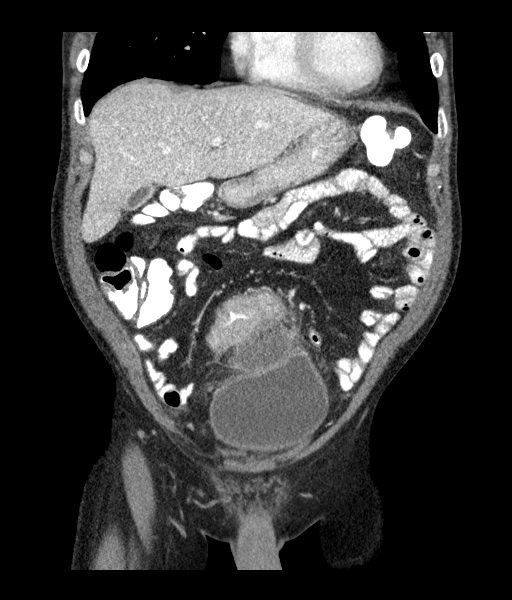
[im 67/151  soft-tissue]
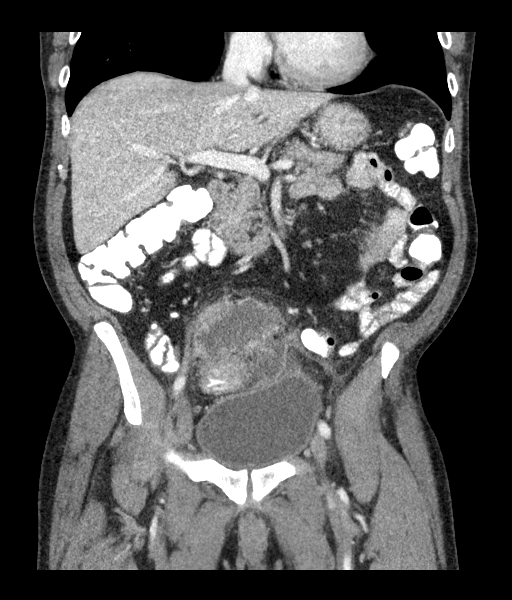
[im 84/151  soft-tissue]
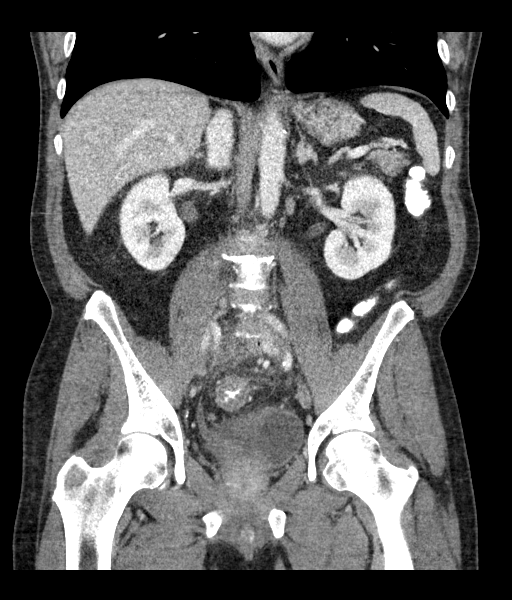

[16 of 46 positions shown; findings below may reference images not displayed]

FINDINGS: Lower chest: Emphysema. Subsegmental atelectasis or scarring at both
lung bases. Normal heart size without pericardial or pleural
effusion.

Hepatobiliary: Too small to characterize right liver lobe lesion on
image [DATE]. Focal steatosis adjacent the falciform ligament. Normal
gallbladder, without biliary ductal dilatation.

Pancreas: Normal, without mass or ductal dilatation.

Spleen: Normal in size, without focal abnormality.

Adrenals/Urinary Tract: Normal adrenal glands. Left renal too small
to characterize lesions. Normal right kidney, without hydronephrosis
or hydroureter. Normal urinary bladder.

Stomach/Bowel: The stomach is primarily decompressed and
underdistended.

Relatively mild diverticulosis centered in the sigmoid. Wall
thickening with relatively localized edema and a macrolobulated
complex fluid collection or collections within the mesocolon. This
is peripherally enhancing and measures on the order of 8.1 x 6.4 cm
on image 47/3. 6.5 cm craniocaudal on sagittal image 90/7. No free
perforation. Normal terminal ileum and appendix. Normal small bowel.

Vascular/Lymphatic: Aortic and branch vessel atherosclerosis. No
abdominopelvic adenopathy.

Reproductive: Normal prostate.

Other: No significant free fluid. There is mild presacral
interstitial thickening which is nonspecific and may track from the
more cephalad pelvic process.

Musculoskeletal: Degenerate disc disease at the lumbosacral
junction.
IMPRESSION: 1. Complicated sigmoid diverticulitis. Macrolobulated complex fluid
collection or collections within the sigmoid mesocolon, most
consistent with phlegmon or developing abscess/abscesses.
2.  Aortic Atherosclerosis ([id number]).

These results will be called to the ordering clinician or
representative by the Radiologist Assistant, and communication
documented in the PACS or zVision Dashboard.

## 2023-05-11 HISTORY — PX: CAROTID ENDARTERECTOMY: SUR193

## 2023-05-22 ENCOUNTER — Telehealth: Payer: Self-pay | Admitting: Neurology

## 2023-05-22 ENCOUNTER — Encounter (HOSPITAL_COMMUNITY): Payer: Self-pay

## 2023-05-22 NOTE — Telephone Encounter (Signed)
Neurology Note - transfer discussion with [facility]  I received a telephone call through [facility] from Dr. Sandi Mariscal, a vascular surgeon at [facility] health [facility] in [facility] regarding this patient.  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  He had a left hemispheric ischemic stroke approximately 8 weeks ago and during his workup was found to have severe left carotid stenosis.  He was recently admitted to [facility] and left carotid endarterectomy was performed.  He initially did well after the procedure however after about 48 hours he developed altered mental status with unclear etiology.  MRI brain and CTA head and neck showed no acute findings to explain.  Teleneurology was consulted because they do not have in-house neurology.  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  Patient's daughter is a Engineer, civil (consulting) at [employer] and requested transfer here.  Per [facility] we will have beds available and I feel the transfer is reasonable and warranted.  I recommended that [facility] call the hospitalist service for admission.  We will consult when he arrives and coordinate continuous EEG monitoring.  Please notify neurology upon patient's arrival to [facility].  Bing Neighbors, MD [facility] [phone]  If 7pm- 7am, please page neurology on call as listed in AMION.

## 2023-05-23 ENCOUNTER — Inpatient Hospital Stay (HOSPITAL_COMMUNITY)
Admit: 2023-05-23 | Discharge: 2023-05-30 | DRG: 101 | Disposition: A | Discharge status: Skilled Nursing Facility | Payer: 59 | Source: Other Acute Inpatient Hospital | Attending: Internal Medicine | Admitting: Internal Medicine

## 2023-05-23 DIAGNOSIS — I69398 Other sequelae of cerebral infarction: Secondary | ICD-10-CM

## 2023-05-23 DIAGNOSIS — G9389 Other specified disorders of brain: Secondary | ICD-10-CM | POA: Diagnosis not present

## 2023-05-23 DIAGNOSIS — I6782 Cerebral ischemia: Secondary | ICD-10-CM | POA: Diagnosis not present

## 2023-05-23 DIAGNOSIS — Z79899 Other long term (current) drug therapy: Secondary | ICD-10-CM

## 2023-05-23 DIAGNOSIS — R4182 Altered mental status, unspecified: Principal | ICD-10-CM | POA: Diagnosis present

## 2023-05-23 DIAGNOSIS — Z8546 Personal history of malignant neoplasm of prostate: Secondary | ICD-10-CM

## 2023-05-23 DIAGNOSIS — G9349 Other encephalopathy: Secondary | ICD-10-CM | POA: Diagnosis not present

## 2023-05-23 DIAGNOSIS — Z7951 Long term (current) use of inhaled steroids: Secondary | ICD-10-CM

## 2023-05-23 DIAGNOSIS — I4719 Other supraventricular tachycardia: Secondary | ICD-10-CM | POA: Diagnosis present

## 2023-05-23 DIAGNOSIS — Z85048 Personal history of other malignant neoplasm of rectum, rectosigmoid junction, and anus: Secondary | ICD-10-CM | POA: Diagnosis not present

## 2023-05-23 DIAGNOSIS — R7303 Prediabetes: Secondary | ICD-10-CM | POA: Diagnosis present

## 2023-05-23 DIAGNOSIS — Z466 Encounter for fitting and adjustment of urinary device: Secondary | ICD-10-CM

## 2023-05-23 DIAGNOSIS — I639 Cerebral infarction, unspecified: Secondary | ICD-10-CM | POA: Diagnosis not present

## 2023-05-23 DIAGNOSIS — Z7902 Long term (current) use of antithrombotics/antiplatelets: Secondary | ICD-10-CM

## 2023-05-23 DIAGNOSIS — I69351 Hemiplegia and hemiparesis following cerebral infarction affecting right dominant side: Secondary | ICD-10-CM

## 2023-05-23 DIAGNOSIS — R569 Unspecified convulsions: Principal | ICD-10-CM | POA: Diagnosis present

## 2023-05-23 DIAGNOSIS — I10 Essential (primary) hypertension: Secondary | ICD-10-CM | POA: Diagnosis present

## 2023-05-23 DIAGNOSIS — Z720 Tobacco use: Secondary | ICD-10-CM

## 2023-05-23 DIAGNOSIS — R4701 Aphasia: Secondary | ICD-10-CM | POA: Diagnosis not present

## 2023-05-23 DIAGNOSIS — R410 Disorientation, unspecified: Secondary | ICD-10-CM | POA: Diagnosis not present

## 2023-05-23 DIAGNOSIS — C61 Malignant neoplasm of prostate: Secondary | ICD-10-CM

## 2023-05-23 DIAGNOSIS — Z8673 Personal history of transient ischemic attack (TIA), and cerebral infarction without residual deficits: Secondary | ICD-10-CM | POA: Diagnosis not present

## 2023-05-23 DIAGNOSIS — N400 Enlarged prostate without lower urinary tract symptoms: Secondary | ICD-10-CM | POA: Diagnosis not present

## 2023-05-23 DIAGNOSIS — J449 Chronic obstructive pulmonary disease, unspecified: Secondary | ICD-10-CM

## 2023-05-23 DIAGNOSIS — Z9889 Other specified postprocedural states: Secondary | ICD-10-CM

## 2023-05-23 DIAGNOSIS — F17210 Nicotine dependence, cigarettes, uncomplicated: Secondary | ICD-10-CM | POA: Diagnosis not present

## 2023-05-23 DIAGNOSIS — E785 Hyperlipidemia, unspecified: Secondary | ICD-10-CM | POA: Diagnosis present

## 2023-05-23 DIAGNOSIS — Z7982 Long term (current) use of aspirin: Secondary | ICD-10-CM | POA: Diagnosis not present

## 2023-05-23 DIAGNOSIS — J439 Emphysema, unspecified: Secondary | ICD-10-CM | POA: Diagnosis present

## 2023-05-23 DIAGNOSIS — R9401 Abnormal electroencephalogram [EEG]: Secondary | ICD-10-CM | POA: Diagnosis not present

## 2023-05-23 HISTORY — DX: Emphysema, unspecified: J43.9

## 2023-05-23 HISTORY — DX: Alcohol abuse, in remission: F10.11

## 2023-05-23 HISTORY — DX: Benign prostatic hyperplasia without lower urinary tract symptoms: N40.0

## 2023-05-23 HISTORY — DX: Essential (primary) hypertension: I10

## 2023-05-23 HISTORY — DX: Cerebral infarction, unspecified: I63.9

## 2023-05-23 HISTORY — DX: Malignant neoplasm of prostate: C61

## 2023-05-23 HISTORY — DX: Personal history of other malignant neoplasm of large intestine: Z85.038

## 2023-05-23 HISTORY — DX: Personal history of nicotine dependence: Z87.891

## 2023-05-23 MED ORDER — LACTATED RINGERS IV SOLN: INTRAVENOUS | Status: DC

## 2023-05-23 MED ORDER — LORAZEPAM 2 MG/ML IJ SOLN: 2.0000 mg | Freq: Once | INTRAMUSCULAR | Status: DC | PRN

## 2023-05-24 ENCOUNTER — Other Ambulatory Visit: Payer: Self-pay

## 2023-05-24 ENCOUNTER — Inpatient Hospital Stay (HOSPITAL_COMMUNITY): Payer: 59

## 2023-05-24 ENCOUNTER — Encounter (HOSPITAL_COMMUNITY): Payer: Self-pay | Admitting: Internal Medicine

## 2023-05-24 DIAGNOSIS — R4182 Altered mental status, unspecified: Principal | ICD-10-CM | POA: Diagnosis present

## 2023-05-24 DIAGNOSIS — J449 Chronic obstructive pulmonary disease, unspecified: Secondary | ICD-10-CM

## 2023-05-24 DIAGNOSIS — R569 Unspecified convulsions: Secondary | ICD-10-CM | POA: Diagnosis not present

## 2023-05-24 DIAGNOSIS — J439 Emphysema, unspecified: Secondary | ICD-10-CM | POA: Diagnosis not present

## 2023-05-24 DIAGNOSIS — Z720 Tobacco use: Secondary | ICD-10-CM

## 2023-05-24 DIAGNOSIS — N400 Enlarged prostate without lower urinary tract symptoms: Secondary | ICD-10-CM

## 2023-05-24 DIAGNOSIS — R410 Disorientation, unspecified: Secondary | ICD-10-CM

## 2023-05-24 DIAGNOSIS — Z85048 Personal history of other malignant neoplasm of rectum, rectosigmoid junction, and anus: Secondary | ICD-10-CM

## 2023-05-24 DIAGNOSIS — E785 Hyperlipidemia, unspecified: Secondary | ICD-10-CM

## 2023-05-24 DIAGNOSIS — I639 Cerebral infarction, unspecified: Secondary | ICD-10-CM | POA: Diagnosis not present

## 2023-05-24 DIAGNOSIS — C61 Malignant neoplasm of prostate: Secondary | ICD-10-CM

## 2023-05-24 LAB — HEMOGLOBIN A1C
Hgb A1c MFr Bld: 6.3 % — ABNORMAL HIGH (ref 4.8–5.6)
Mean Plasma Glucose: 134.11 mg/dL

## 2023-05-24 LAB — GLUCOSE, CAPILLARY
Glucose-Capillary: 103 mg/dL — ABNORMAL HIGH (ref 70–99)
Glucose-Capillary: 166 mg/dL — ABNORMAL HIGH (ref 70–99)
Glucose-Capillary: 174 mg/dL — ABNORMAL HIGH (ref 70–99)

## 2023-05-24 LAB — FOLATE: Folate: 6 ng/mL (ref >5.9)

## 2023-05-24 LAB — TSH: TSH: 0.697 u[IU]/mL (ref 0.350–4.500)

## 2023-05-24 LAB — AMMONIA: Ammonia: <10 umol/L (ref 9–35)

## 2023-05-24 LAB — HIV ANTIBODY (ROUTINE TESTING W REFLEX): HIV Screen 4th Generation wRfx: NONREACTIVE

## 2023-05-24 LAB — VITAMIN B12: Vitamin B-12: 602 pg/mL (ref 180–914)

## 2023-05-24 MED ORDER — METOPROLOL TARTRATE 12.5 MG HALF TABLET
12.5000 mg | ORAL_TABLET | Freq: Two times a day (BID) | ORAL | Status: DC
  Administered 2023-05-24 – 2023-05-28 (×10): 12.5 mg via ORAL
  Filled 2023-05-24 (×10): qty 1

## 2023-05-24 MED ORDER — THIAMINE HCL 100 MG/ML IJ SOLN
250.0000 mg | Freq: Every day | INTRAVENOUS | Status: DC
  Administered 2023-05-26 – 2023-05-30 (×5): 250 mg via INTRAVENOUS
  Filled 2023-05-24 (×5): qty 2.5

## 2023-05-24 MED ORDER — ACETAMINOPHEN 325 MG PO TABS
650.0000 mg | ORAL_TABLET | Freq: Four times a day (QID) | ORAL | Status: DC | PRN
  Administered 2023-05-27 – 2023-05-28 (×3): 650 mg via ORAL
  Filled 2023-05-24 (×3): qty 2

## 2023-05-24 MED ORDER — LORAZEPAM 1 MG PO TABS: 1.0000 mg | ORAL_TABLET | ORAL | Status: AC | PRN

## 2023-05-24 MED ORDER — CLOPIDOGREL BISULFATE 75 MG PO TABS: 75.0000 mg | ORAL_TABLET | Freq: Every day | ORAL | Status: DC

## 2023-05-24 MED ORDER — THIAMINE HCL 100 MG/ML IJ SOLN
500.0000 mg | Freq: Three times a day (TID) | INTRAVENOUS | Status: AC
  Administered 2023-05-24 – 2023-05-25 (×6): 500 mg via INTRAVENOUS
  Filled 2023-05-24 (×6): qty 5

## 2023-05-24 MED ORDER — ASPIRIN 81 MG PO TBEC
81.0000 mg | DELAYED_RELEASE_TABLET | Freq: Every day | ORAL | Status: DC
  Administered 2023-05-24 – 2023-05-30 (×7): 81 mg via ORAL
  Filled 2023-05-24 (×7): qty 1

## 2023-05-24 MED ORDER — MOMETASONE FURO-FORMOTEROL FUM 200-5 MCG/ACT IN AERO
2.0000 | INHALATION_SPRAY | Freq: Two times a day (BID) | RESPIRATORY_TRACT | Status: DC
  Administered 2023-05-24 – 2023-05-30 (×13): 2 via RESPIRATORY_TRACT
  Filled 2023-05-24: qty 8.8

## 2023-05-24 MED ORDER — SODIUM CHLORIDE 0.9% FLUSH: 10.0000 mL | INTRAVENOUS | Status: DC | PRN

## 2023-05-24 MED ORDER — LORAZEPAM 2 MG/ML IJ SOLN: 2.0000 mg | Freq: Once | INTRAMUSCULAR | Status: DC | PRN

## 2023-05-24 MED ORDER — METOPROLOL TARTRATE 5 MG/5ML IV SOLN: 5.0000 mg | INTRAVENOUS | Status: DC | PRN

## 2023-05-24 MED ORDER — CLOPIDOGREL BISULFATE 75 MG PO TABS
75.0000 mg | ORAL_TABLET | Freq: Every day | ORAL | Status: DC
  Administered 2023-05-24 – 2023-05-30 (×7): 75 mg via ORAL
  Filled 2023-05-24 (×7): qty 1

## 2023-05-24 MED ORDER — DEXTROSE IN LACTATED RINGERS 5 % IV SOLN: INTRAVENOUS | Status: DC

## 2023-05-24 MED ORDER — ACETAMINOPHEN 650 MG RE SUPP: 650.0000 mg | Freq: Four times a day (QID) | RECTAL | Status: DC | PRN

## 2023-05-24 MED ORDER — ROSUVASTATIN CALCIUM 20 MG PO TABS
40.0000 mg | ORAL_TABLET | Freq: Every day | ORAL | Status: DC
  Administered 2023-05-24 – 2023-05-30 (×7): 40 mg via ORAL
  Filled 2023-05-24 (×7): qty 2

## 2023-05-24 MED ORDER — TAMSULOSIN HCL 0.4 MG PO CAPS
0.4000 mg | ORAL_CAPSULE | Freq: Every day | ORAL | Status: DC
  Administered 2023-05-24 – 2023-05-30 (×7): 0.4 mg via ORAL
  Filled 2023-05-24 (×7): qty 1

## 2023-05-24 MED ORDER — LEVETIRACETAM IN NACL 1000 MG/100ML IV SOLN
1000.0000 mg | Freq: Two times a day (BID) | INTRAVENOUS | Status: DC
  Filled 2023-05-24 (×3): qty 100

## 2023-05-24 MED ORDER — THIAMINE HCL 100 MG/ML IJ SOLN: 100.0000 mg | Freq: Every day | INTRAMUSCULAR | Status: DC

## 2023-05-24 MED ORDER — NICOTINE 21 MG/24HR TD PT24
21.0000 mg | MEDICATED_PATCH | Freq: Every day | TRANSDERMAL | Status: DC
  Administered 2023-05-25 – 2023-05-30 (×7): 21 mg via TRANSDERMAL
  Filled 2023-05-24 (×7): qty 1

## 2023-05-24 MED ORDER — SODIUM CHLORIDE 0.9% FLUSH
10.0000 mL | Freq: Two times a day (BID) | INTRAVENOUS | Status: DC
  Administered 2023-05-25 – 2023-05-30 (×5): 10 mL

## 2023-05-24 MED ORDER — LORAZEPAM 2 MG/ML IJ SOLN: 1.0000 mg | INTRAMUSCULAR | Status: AC | PRN

## 2023-05-24 MED ORDER — ATORVASTATIN CALCIUM 10 MG PO TABS: 20.0000 mg | ORAL_TABLET | Freq: Every day | ORAL | Status: DC

## 2023-05-24 MED ORDER — ASPIRIN 300 MG RE SUPP: 300.0000 mg | Freq: Every day | RECTAL | Status: DC

## 2023-05-24 MED ORDER — BISACODYL 10 MG RE SUPP: 10.0000 mg | Freq: Every day | RECTAL | Status: DC | PRN

## 2023-05-24 MED ORDER — INSULIN ASPART 100 UNIT/ML IJ SOLN
0.0000 [IU] | Freq: Four times a day (QID) | INTRAMUSCULAR | Status: DC
  Administered 2023-05-24 (×2): 2 [IU] via SUBCUTANEOUS
  Administered 2023-05-25: 1 [IU] via SUBCUTANEOUS
  Administered 2023-05-25: 2 [IU] via SUBCUTANEOUS
  Administered 2023-05-26 – 2023-05-27 (×5): 1 [IU] via SUBCUTANEOUS
  Administered 2023-05-27: 3 [IU] via SUBCUTANEOUS
  Administered 2023-05-27: 2 [IU] via SUBCUTANEOUS
  Administered 2023-05-27 – 2023-05-29 (×8): 1 [IU] via SUBCUTANEOUS
  Administered 2023-05-29: 2 [IU] via SUBCUTANEOUS
  Administered 2023-05-30 (×2): 1 [IU] via SUBCUTANEOUS

## 2023-05-24 MED ORDER — AMLODIPINE BESYLATE 5 MG PO TABS
5.0000 mg | ORAL_TABLET | Freq: Every day | ORAL | Status: DC
  Administered 2023-05-24 – 2023-05-30 (×7): 5 mg via ORAL
  Filled 2023-05-24 (×7): qty 1

## 2023-05-24 MED ORDER — LEVETIRACETAM 500 MG PO TABS
1000.0000 mg | ORAL_TABLET | Freq: Two times a day (BID) | ORAL | Status: DC
  Administered 2023-05-24 – 2023-05-30 (×13): 1000 mg via ORAL
  Filled 2023-05-24 (×13): qty 2

## 2023-05-24 MED ORDER — HEPARIN SODIUM (PORCINE) 5000 UNIT/ML IJ SOLN
5000.0000 [IU] | Freq: Three times a day (TID) | INTRAMUSCULAR | Status: DC
  Administered 2023-05-24 – 2023-05-30 (×19): 5000 [IU] via SUBCUTANEOUS
  Filled 2023-05-24 (×19): qty 1

## 2023-05-24 NOTE — Evaluation (Signed)
Physical Therapy Evaluation Patient Details Name: Brian Dudley MRN: [id number] DOB: [birth year] Today's Date: 05/24/2023  History of Present Illness  Pt is a 62 y/o male transferred 05/24/23 from [facility] in [place], [place]. Recent L carotid endarterectomy (8/2) with development of AMS and concern for focal hemispheric seizures. MRI Brain w/o contrast and CTA which were non revealing. EEG with evidence of epileptogenicity arising from left posterior quadrant. PMH: CVA (8 weeks ago per chart), COPD, hx of alcohol abuse, tobacco abuse, HTN, prostate cancer.   Clinical Impression  Pt presents with condition above and deficits mentioned below, see PT Problem List. Pt is currently demonstrating deficits in cognition and communication and there is no family present to provide PLOF/home set-up info. At this time, he is requiring min-modA for bed mobility and modAx2 to transfer to stand and take a couple steps along EOB with RW for support. He is at high risk for falls with noted R knee buckling in standing. He demonstrates deficits in balance, activity tolerance, coordination, and R-sided strength in additions to deficits in cognition and communication. At this time, he could benefit from inpatient rehab, <3 hours/day, at d/c. Will continue to follow acutely.        If plan is discharge home, recommend the following: Two people to help with walking and/or transfers;A lot of help with bathing/dressing/bathroom;Assistance with cooking/housework;Direct supervision/assist for medications management;Direct supervision/assist for financial management;Help with stairs or ramp for entrance;Assist for transportation;Supervision due to cognitive status   Can travel by private vehicle   No    Equipment Recommendations Other (comment) (defer to next venue of care)  Recommendations for Other Services       Functional Status Assessment Patient has had a recent decline in their functional status  and demonstrates the ability to make significant improvements in function in a reasonable and predictable amount of time.     Precautions / Restrictions Precautions Precautions: Fall;Other (comment) Precaution Comments: continuous EEG Restrictions Weight Bearing Restrictions: No      Mobility  Bed Mobility Overal bed mobility: Needs Assistance Bed Mobility: Supine to Sit, Sit to Supine     Supine to sit: Mod assist, HOB elevated, Used rails Sit to supine: Min assist, HOB elevated   General bed mobility comments: some automatic reaching for bedrail with LUE and bringing BLE to EOB, mod assist for trunk to sit up L EOB. Pt able to assist with LE back to bed, more assist for RLE than LLE    Transfers Overall transfer level: Needs assistance Equipment used: Rolling walker (2 wheels) Transfers: Sit to/from Stand Sit to Stand: Mod assist, +2 physical assistance, +2 safety/equipment           General transfer comment: Mod A x 2 for standing with RW, unable to hold R hand on RW.    Ambulation/Gait Ambulation/Gait assistance: Mod assist, +2 physical assistance, +2 safety/equipment Gait Distance (Feet): 2 Feet Assistive device: Rolling walker (2 wheels) Gait Pattern/deviations: Step-to pattern, Decreased step length - right, Decreased step length - left, Decreased stride length, Decreased dorsiflexion - right, Decreased weight shift to right, Knees buckling Gait velocity: reduced Gait velocity interpretation: <1.31 ft/sec, indicative of household ambulator   General Gait Details: Mod A x 2 for weight shifting, balance, and RW management for side stepping to L along bedside with R knee buckling noted and noted decreased R weight shift and foot clearance.  Stairs            Psychologist, prison and probation services  Tilt Bed    Modified Rankin (Stroke Patients Only) Modified Rankin (Stroke Patients Only) Pre-Morbid Rankin Score: Slight disability Modified Rankin: Moderately severe  disability     Balance Overall balance assessment: Needs assistance Sitting-balance support: No upper extremity supported, Feet supported Sitting balance-Leahy Scale: Fair     Standing balance support: Bilateral upper extremity supported, During functional activity Standing balance-Leahy Scale: Poor Standing balance comment: Reliant on RW and modAx2                             Pertinent Vitals/Pain Pain Assessment Pain Assessment: Faces Faces Pain Scale: Hurts little more Pain Location: generalized with RUE movement, unable to report location of pain Pain Descriptors / Indicators: Grimacing, Guarding Pain Intervention(s): Limited activity within patient's tolerance, Monitored during session, Repositioned    Home Living Family/patient expects to be discharged to:: Unsure                   Additional Comments: No family numbers listed in chart at time of eval. from prior admission 2019 - pt typically lives with mother in [place] ([place]; transferred from this hospital). Per chart, pt has a daughter that lives in [place]    Prior Function Prior Level of Function : Patient poor historian/Family not available                     Extremity/Trunk Assessment   Upper Extremity Assessment Upper Extremity Assessment: Defer to OT evaluation    Lower Extremity Assessment Lower Extremity Assessment: RLE deficits/detail;Generalized weakness RLE Deficits / Details: Per chart, pt has some residudal R weakness from recent CVA, but current weakness is worse than usual; MMT scores of 3 knee extension    Cervical / Trunk Assessment Cervical / Trunk Assessment: Normal  Communication   Communication Communication: Difficulty following commands/understanding;Difficulty communicating thoughts/reduced clarity of speech Following commands: Follows one step commands inconsistently;Follows one step commands with increased time Cueing Techniques:  Verbal cues;Gestural cues;Tactile cues;Visual cues  Cognition Arousal: Alert Behavior During Therapy: Flat affect Overall Cognitive Status: No family/caregiver present to determine baseline cognitive functioning                                 General Comments: A&Ox1, repeats "they need to fly me out of here", inconsistent command following (some motor planning impairments?), dysarthric        General Comments      Exercises     Assessment/Plan    PT Assessment Patient needs continued PT services  PT Problem List Decreased strength;Decreased activity tolerance;Decreased balance;Decreased mobility;Decreased cognition;Decreased coordination;Decreased safety awareness       PT Treatment Interventions DME instruction;Gait training;Stair training;Functional mobility training;Therapeutic activities;Therapeutic exercise;Balance training;Neuromuscular re-education;Cognitive remediation;Patient/family education    PT Goals (Current goals can be found in the Care Plan section)  Acute Rehab PT Goals Patient Stated Goal: "they need to fly me out of here" PT Goal Formulation: With patient Time For Goal Achievement: 06/07/23 Potential to Achieve Goals: Good    Frequency Min 1X/week     Co-evaluation PT/OT/SLP Co-Evaluation/Treatment: Yes Reason for Co-Treatment: Necessary to address cognition/behavior during functional activity;For patient/therapist safety;To address functional/ADL transfers PT goals addressed during session: Mobility/safety with mobility;Balance         AM-PAC PT "6 Clicks" Mobility  Outcome Measure Help needed turning from your back to your side while in a  flat bed without using bedrails?: A Lot Help needed moving from lying on your back to sitting on the side of a flat bed without using bedrails?: A Lot Help needed moving to and from a bed to a chair (including a wheelchair)?: Total Help needed standing up from a chair using your arms (e.g.,  wheelchair or bedside chair)?: Total Help needed to walk in hospital room?: Total Help needed climbing 3-5 steps with a railing? : Total 6 Click Score: 8    End of Session   Activity Tolerance: Patient tolerated treatment well Patient left: in bed;with call bell/phone within reach;with bed alarm set Nurse Communication: Mobility status PT Visit Diagnosis: Unsteadiness on feet (R26.81);Other abnormalities of gait and mobility (R26.89);Muscle weakness (generalized) (M62.81);Difficulty in walking, not elsewhere classified (R26.2);Other symptoms and signs involving the nervous system (R29.898)    Time: 1610-9604 PT Time Calculation (min) (ACUTE ONLY): 18 min   Charges:   PT Evaluation $PT Eval Moderate Complexity: 1 Mod   PT General Charges $$ ACUTE PT VISIT: 1 Visit         Raymond Gurney, PT, DPT Acute Rehabilitation Services  Office: [phone]   Jewel Baize 05/24/2023, 5:50 PM

## 2023-05-24 NOTE — Progress Notes (Signed)
EEG complete - results pending 

## 2023-05-24 NOTE — TOC CM/SW Note (Signed)
Transition of Care [facility]) - Inpatient Brief Assessment   Patient Details  Name: Brian Dudley MRN: [id number] Date of Birth: 1960/11/20  Transition of Care [facility]) CM/SW Contact:    [name], LCSW Phone Number: 05/24/2023, 11:58 AM   Clinical Narrative:   CSW acknowledging consult for substance abuse counseling. Per H&P, patient does not report any current alcohol or drug use, and is inappropriate for assessment or counseling at this time due to confusion. If new need arises, please reconsult.    Transition of Care Asessment: Insurance and Status: Insurance coverage has been reviewed Patient has primary care physician: Yes Home environment has been reviewed: Home   Prior/Current Home Services: No current home services Social Determinants of Health Reivew: SDOH reviewed no interventions necessary Readmission risk has been reviewed: Yes Transition of care needs: transition of care needs identified, TOC will continue to follow

## 2023-05-24 NOTE — Progress Notes (Signed)
Neurology brief update  Please see Dr. Tollie Eth neuro consult note from this AM.   Overnight EEG 0406 to [phone] showed: - Spikes, left posterior quadrant - Continuous slow, left hemisphere - Background asymmetry, left<right  Plan to continue LTM. Neuro will continue to follow. Please call or chat with questions.  Bing Neighbors, MD [facility] [phone]  If 7pm- 7am, please page neurology on call as listed in AMION.

## 2023-05-24 NOTE — Procedures (Signed)
Patient Name: Baris Pajak  MRN: [id number]  Epilepsy Attending: Charlsie Quest  Referring Physician/Provider: Erick Blinks, MD  Duration: 05/24/2023 0406 to 05/25/2023 0406   Patient history: 63 y.o. male with PMH significant for EtOh use, COPD, prior L frontoparietal stroke, HTN, who was transferred from outside hospital for concern for focal L hemispheric seizures after L carotid endarterectomy. EEG to evaluate for seizure.   Level of alertness: Awake, asleep   AEDs during EEG study: LEV   Technical aspects: This EEG study was done with scalp electrodes positioned according to the 10-20 International system of electrode placement. Electrical activity was reviewed with band pass filter of 1-70Hz , sensitivity of 7 uV/mm, display speed of 66mm/sec with a 60Hz  notched filter applied as appropriate. EEG data were recorded continuously and digitally stored.  Video monitoring was available and reviewed as appropriate.   Description: The posterior dominant rhythm consists of 8 Hz activity of moderate voltage (25-35 uV) seen predominantly in posterior head regions, asymmetric ( left<right) and reactive to eye opening and eye closing. Sleep was characterized by sleep spindles (12 to 15 Hz), maximal frontocentral region. EEG showed continuous 3 to 5 Hz theta-delta slowing in left hemisphere. Frequent spikes were noted in left posterior quadrant, qasi -periodic during sleep at 0.25-1Hz .   ABNORMALITY - Spikes, left posterior quadrant - Continuous slow, left hemisphere - Background asymmetry, left<right   IMPRESSION: This study showed evidence of epileptogenicity arising from left posterior quadrant.  Additionally there was cortical dysfunction arising from left hemisphere likely secondary to underlying stroke.  No seizures were seen during this time.    [name]

## 2023-05-24 NOTE — Progress Notes (Signed)

## 2023-05-24 NOTE — Consult Note (Signed)
NEUROLOGY CONSULTATION NOTE   Date of service: May 24, 2023 Patient Name: Brian Dudley MRN:  [id number] DOB:  04-29-1961 Reason for consult: "concern for seizures" Requesting Provider: Gery Pray, MD _ _ _   _ __   _ __ _ _  __ __   _ __   __ _  History of Present Illness  Brian Dudley is a 62 y.o. male with PMH significant for EtOh use, COPD, prior L frontoparietal stroke, HTN, who was transferred from outside hospital for concern for focal L hemispheric seizures. Per notes reviewed from OSH, was admitted and underwent L carotid endarterectomy for a L hemispheric stroke. He seemed to be doing fine initially and for about 48 hours post operatively. He developed confusion. He had MRI Brain w/o contrast and CTA which were non revealing. Routine EEG with L sided slowing but no epileptiform discharges. He was noted to have some episodes of R arm shaking and was started on Keppra 1000mg  BID ofr presumed seizures. He was noted to be weaker on the right compared to his baseline and more encephalopathic and aphasic. He had repeat MRI Brain with and without contrast again on 8/13 and had a routine EEG again which were both on revealing. With noted persistent confusion, he was transferred to [facility] for cEEG and for [facility] neurology evaluation.   On my evaluation, he is attempting to get out of bed. Asking me to lend him a hand and pull him up. He favors using LUE more than right but does have some movement in RUE. He is confused, disoriented, thinks he is 33 and not aware of what month or year it is. Althou not entirely clear if this is all confusion or aphasia on top of confusion.    ROS   Unable to obtain ROS due to confusion and aphasia.  Past History   Past Medical History:  Diagnosis Date   BPH (benign prostatic hyperplasia)    COPD with emphysema (HCC)    CVA (cerebral vascular accident) (HCC)    Diverticulitis    History of alcohol abuse    History of colon  cancer    History of tobacco abuse    Hypertension    Prostate cancer [facility])    Past Surgical History:  Procedure Laterality Date   CAROTID ENDARTERECTOMY Left 05/11/2023   TONSILLECTOMY     No family history on file. Social History   Socioeconomic History   Marital status: Single    Spouse name: Not on file   Number of children: Not on file   Years of education: Not on file   Highest education level: Not on file  Occupational History   Not on file  Tobacco Use   Smoking status: Every Day    Types: Cigarettes   Smokeless tobacco: Never   Tobacco comments:    CUTTING BACK TO 8 TO 10 CIGS A DAY  Vaping Use   Vaping status: Never Used  Substance and Sexual Activity   Alcohol use: Not Currently   Drug use: Not Currently   Sexual activity: Not on file  Other Topics Concern   Not on file  Social History Narrative   Not on file   Social Determinants of Health   Financial Resource Strain: Low Risk  (09/09/2019)   Received from [facility]   Overall Financial Resource Strain (CARDIA)    Difficulty of Paying Living Expenses: Not very hard  Food Insecurity: Patient Unable To Answer (05/24/2023)  Hunger Vital Sign    Worried About Running Out of Food in the Last Year: Patient unable to answer    Ran Out of Food in the Last Year: Patient unable to answer  Transportation Needs: No Transportation Needs (09/09/2019)   Received from [facility]   PRAPARE - Transportation    Lack of Transportation (Medical): No    Lack of Transportation (Non-Medical): No  Physical Activity: Inactive (11/07/2018)   Received from [facility]   Exercise Vital Sign    Days of Exercise per Week: 0 days    Minutes of Exercise per Session: 0 min  Stress: Stress Concern Present (11/07/2018)   Received from [facility] - Occupational Stress Questionnaire    Feeling of Stress : To some extent  Social Connections: Unknown  (12/06/2022)   Received from [facility]   Social Network    Social Network: Not on file   No Known Allergies  Medications   Medications Prior to Admission  Medication Sig Dispense Refill Last Dose   acetaminophen (TYLENOL) 325 MG tablet Take 2 tablets (650 mg total) by mouth every 6 (six) hours as needed for mild pain (or Fever >/= 101).      ciprofloxacin (CIPRO) 500 MG tablet Take 1 tablet (500 mg total) by mouth 2 (two) times daily. 20 tablet 0    folic acid (FOLVITE) 1 MG tablet Take 1 tablet (1 mg total) by mouth daily. 30 tablet 0    lactobacillus acidophilus & bulgar (LACTINEX) chewable tablet Chew 1 tablet by mouth 3 (three) times daily with meals. 60 tablet 0    metroNIDAZOLE (FLAGYL) 500 MG tablet Take 1 tablet (500 mg total) by mouth 3 (three) times daily. 30 tablet 0    Multiple Vitamin (MULTIVITAMIN WITH MINERALS) TABS tablet Take 1 tablet by mouth daily. 30 tablet 0    potassium chloride (K-DUR) 10 MEQ tablet Take 1 tablet (10 mEq total) by mouth daily. 10 tablet 0    thiamine 100 MG tablet Take 1 tablet (100 mg total) by mouth daily. 30 tablet 0      Vitals   Vitals:   05/23/23 2308  BP: 137/76  Pulse: 98  Resp: 18  Temp: 97.8 F (36.6 C)  TempSrc: Oral  SpO2: 100%     There is no height or weight on file to calculate BMI.  Physical Exam   General: Laying comfortably in bed; easily gets agitated. HENT: Normal oropharynx and mucosa. Normal external appearance of ears and nose.  Neck: Supple, no pain or tenderness  CV: No JVD. No peripheral edema.  Pulmonary: Symmetric Chest rise. Normal respiratory effort.  Abdomen: Soft to touch, non-tender.  Ext: No cyanosis, edema, or deformity  Skin: No rash. Normal palpation of skin.   Musculoskeletal: Normal digits and nails by inspection. No clubbing.   Neurologic Examination  Mental status/Cognition: drosy, oriented to self only. Poor attention. Speech/language: dysarhtic, non fluent, comprehension intact  intermittently to simple commands, does not name objects. Cranial nerves:   CN II Pupils equal and reactive to light, blinks to threat BL   CN III,IV,VI makes eye contact on left and right. No nystagmus.   CN V Corneals intact BL   CN VII Symmetric facial smile and grimace.   CN VIII Makes eye contact to speech. Althou concerns that he maybe hard of hearing.   CN IX & X Seems to be protecting his airway   CN XI  Head midline.   CN XII midline tongue protrusion   Motor:  Muscle bulk: poor, tone normal. Unable to do detailed strength testing secondary to encephalopathy and concern for aphasia. Moves LUE and LLE spontaneously. Will move all extremities to command. RUE with some antigravity movement but LUE is much more robust.  Sensation:  Light touch Grossly intact BL   Pin prick    Temperature    Vibration   Proprioception    Coordination/Complex Motor:  - Finger to Nose intact on the left, would not try FTN with RUE despite repeatedly asking him. - Heel to shin unable to assess due to confusion. - Rapid alternating movement are slowed throughout - Gait: deferred for patient safety.  Labs   CBC:  Recent Labs  Lab 05/24/23 0251  WBC 9.9  NEUTROABS 7.1  HGB 12.6*  HCT 39.4  MCV 87.9  PLT 486*    Basic Metabolic Panel:  Lab Results  Component Value Date   NA 134 (L) 05/24/2023   K 3.7 05/24/2023   CO2 25 05/24/2023   GLUCOSE 108 (H) 05/24/2023   BUN <5 (L) 05/24/2023   CREATININE 0.62 05/24/2023   CALCIUM 9.2 05/24/2023   GFRNONAA >60 05/24/2023   GFRAA >60 03/28/2018   Lipid Panel: No results found for: "LDLCALC" HgbA1c:  Lab Results  Component Value Date   HGBA1C 6.3 (H) 05/24/2023   Urine Drug Screen: No results found for: "LABOPIA", "COCAINSCRNUR", "LABBENZ", "AMPHETMU", "THCU", "LABBARB"  Alcohol Level No results found for: "ETH"  Unable to review images from OSH. Read radiology reports with no acute abnormalities noted on CTH, CTA, MRI Brians. Noted  remote L frontoparietal encephalomalacia.  cEEG:  pending  Impression   Brian Dudley is a 62 y.o. male with PMH significant for EtOh use, COPD, prior L frontoparietal stroke, HTN, who was transferred from outside hospital for concern for focal L hemispheric seizures after L carotid endarterectomy. He was initially at his baseline post op, then noted to have worsening R sided weakness, aphasia and confusion with intermittent R hand jerking.  Imaging at OSH with MRIs and routine EEGs were non revealing. He was started on Keppra 1000mg  BID and transferred for [facility] neurology evaluation and cEEG.  Recommendations  - LTM EEG to evaluate for seizures - continue Keppra 1000mg  BID - thiamine wernickes dosing. ______________________________________________________________________   Thank you for the opportunity to take part in the care of this patient. If you have any further questions, please contact the neurology consultation attending.  Signed,  Erick Blinks [facility] _ _ _   _ __   _ __ _ _  __ __   _ __   __ _

## 2023-05-24 NOTE — Progress Notes (Signed)
LTM EEG hooked up and running - no initial skin breakdown - push button tested - [facility] monitoring.  

## 2023-05-24 NOTE — Evaluation (Signed)
Occupational Therapy Evaluation Patient Details Name: Brian Dudley MRN: [id number] DOB: 1961/06/18 Today's Date: 05/24/2023   History of Present Illness Pt is a 62 y/o male transferred from [facility] in [place], [place]. Recent L carotid endarterectomy (8/2) with development of AMS and concern for focal hemispheric seizures. Transferred to [facility] for LTM EEG. PMH: CVA (8 weeks ago per chart), COPD, hx of alcohol abuse, tobacco abuse, HTN, prostate cancer.   Clinical Impression   Pt presents with deficits in R sided strength, standing balance, coordination, cognition and possible R inattention/visual deficits. Pt requires Min-Mod A for bed mobility, Mod A x 2 for side steps along bedside (limited in OOB tasks w/ continuous EEG). Pt requires Max-Total A for ADLs and w/ noted difficulty tracking to midline/R side. Based on current presentation, recommend continued inpatient follow up therapy, <3 hours/day at DC.       If plan is discharge home, recommend the following: A lot of help with walking and/or transfers;Two people to help with walking and/or transfers;A lot of help with bathing/dressing/bathroom;Two people to help with bathing/dressing/bathroom    Functional Status Assessment  Patient has had a recent decline in their functional status and demonstrates the ability to make significant improvements in function in a reasonable and predictable amount of time.  Equipment Recommendations  Other (comment) (TBD pending progress)    Recommendations for Other Services       Precautions / Restrictions Precautions Precautions: Fall;Other (comment) Precaution Comments: continuous EEG Restrictions Weight Bearing Restrictions: No      Mobility Bed Mobility Overal bed mobility: Needs Assistance Bed Mobility: Supine to Sit, Sit to Supine     Supine to sit: Mod assist, HOB elevated, Used rails Sit to supine: Min assist   General bed mobility comments: some automatic  reaching for bedrail with LUE and bringing BLE to EOB,assist for trunk. Pt able to assist with LE back to bed, more assist for RLE than LLE    Transfers Overall transfer level: Needs assistance Equipment used: Rolling walker (2 wheels) Transfers: Sit to/from Stand Sit to Stand: Mod assist, +2 physical assistance, +2 safety/equipment           General transfer comment: Mod A x 2 for standing with RW, unable to hold R hand on RW. Mod A x 2 for side stepping along bedside with R knee buckling noted.      Balance Overall balance assessment: Needs assistance Sitting-balance support: No upper extremity supported, Feet supported Sitting balance-Leahy Scale: Fair     Standing balance support: Bilateral upper extremity supported, During functional activity Standing balance-Leahy Scale: Poor                             ADL either performed or assessed with clinical judgement   ADL Overall ADL's : Needs assistance/impaired Eating/Feeding: NPO;Maximal assistance Eating/Feeding Details (indicate cue type and reason): RN in to provide meds with applesauce, pt openining mouth automatically to eat. attempted to have pt assist but attempted to drink out of fork like a straw and drink apple sauce like a drink. held out spoon though would not initiate Grooming: Maximal assistance;Sitting   Upper Body Bathing: Maximal assistance;Sitting   Lower Body Bathing: Total assistance   Upper Body Dressing : Maximal assistance   Lower Body Dressing: Total assistance       Toileting- Clothing Manipulation and Hygiene: Total assistance;Bed level  Vision Ability to See in Adequate Light: 2 Moderately impaired Patient Visual Report: Other (comment) (question R inattention/neglect) Vision Assessment?: Vision impaired- to be further tested in functional context Additional Comments: preference for L gaze, difficulty tracking to midline and does not turn to scan to R on  command     Perception         Praxis         Pertinent Vitals/Pain Pain Assessment Pain Assessment: Faces Faces Pain Scale: Hurts little more Pain Location: generalized with RUE movement, unable to report location of pain Pain Descriptors / Indicators: Grimacing, Guarding Pain Intervention(s): Monitored during session, Limited activity within patient's tolerance, Repositioned     Extremity/Trunk Assessment Upper Extremity Assessment Upper Extremity Assessment: Generalized weakness;RUE deficits/detail RUE Deficits / Details: some automatic movements in this UE but not to command. PROM WFL hand, wrist and elbow. approx 90* for shoulder flex/ext RUE Coordination: decreased gross motor;decreased fine motor   Lower Extremity Assessment Lower Extremity Assessment: Defer to PT evaluation   Cervical / Trunk Assessment Cervical / Trunk Assessment: Normal   Communication Communication Communication: Difficulty following commands/understanding;Difficulty communicating thoughts/reduced clarity of speech Following commands: Follows one step commands with increased time;Follows one step commands inconsistently Cueing Techniques: Verbal cues;Gestural cues;Tactile cues;Visual cues   Cognition Arousal: Alert Behavior During Therapy: Flat affect Overall Cognitive Status: No family/caregiver present to determine baseline cognitive functioning                                 General Comments: A&Ox1, repeats "they need to fly me out of here", inconsistent command following (some motor planning impairments?), dysarthric     General Comments       Exercises     Shoulder Instructions      Home Living Family/patient expects to be discharged to:: Unsure                                 Additional Comments: No family numbers listed in chart at time of eval. from prior admission 2019 - pt typically lives with mother in [place] ([place];  transferred from this hospital). Per chart, pt has a daughter that lives in [place]      Prior Functioning/Environment Prior Level of Function : Patient poor historian/Family not available                        OT Problem List: Decreased strength;Impaired balance (sitting and/or standing);Decreased activity tolerance;Decreased cognition;Decreased safety awareness;Decreased coordination;Impaired vision/perception;Decreased range of motion;Impaired UE functional use      OT Treatment/Interventions: Self-care/ADL training;Therapeutic exercise;DME and/or AE instruction;Energy conservation;Therapeutic activities;Patient/family education    OT Goals(Current goals can be found in the care plan section) Acute Rehab OT Goals Patient Stated Goal: fly me out of here OT Goal Formulation: With patient Time For Goal Achievement: 06/07/23 Potential to Achieve Goals: Good ADL Goals Pt Will Perform Grooming: with min assist;sitting Pt Will Perform Upper Body Bathing: with min assist;sitting Pt Will Perform Lower Body Bathing: with mod assist;sitting/lateral leans;sit to/from stand Pt Will Transfer to Toilet: with min assist;stand pivot transfer;bedside commode Pt/caregiver will Perform Home Exercise Program: Increased strength;Increased ROM;With written HEP provided;With Supervision;Right Upper extremity  OT Frequency: Min 1X/week    Co-evaluation PT/OT/SLP Co-Evaluation/Treatment: Yes Reason for Co-Treatment: Necessary to address cognition/behavior during functional activity;For patient/therapist safety;To address functional/ADL transfers  OT goals addressed during session: ADL's and self-care;Proper use of Adaptive equipment and DME;Strengthening/ROM      AM-PAC OT "6 Clicks" Daily Activity     Outcome Measure Help from another person eating meals?: Total Help from another person taking care of personal grooming?: A Lot Help from another person toileting, which includes using  toliet, bedpan, or urinal?: Total Help from another person bathing (including washing, rinsing, drying)?: A Lot Help from another person to put on and taking off regular upper body clothing?: A Lot Help from another person to put on and taking off regular lower body clothing?: Total 6 Click Score: 9   End of Session Equipment Utilized During Treatment: Rolling walker (2 wheels) Nurse Communication: Mobility status  Activity Tolerance: Patient tolerated treatment well Patient left: in bed;with call bell/phone within reach;with bed alarm set  OT Visit Diagnosis: Unsteadiness on feet (R26.81);Other abnormalities of gait and mobility (R26.89);Muscle weakness (generalized) (M62.81)                Time: 1191-4782 OT Time Calculation (min): 17 min Charges:  OT General Charges $OT Visit: 1 Visit OT Evaluation $OT Eval Moderate Complexity: 1 Mod  Bradd Canary, OTR/L Acute Rehab Services Office: [phone]   Lorre Munroe 05/24/2023, 8:58 AM

## 2023-05-24 NOTE — Procedures (Addendum)
Patient Name: Brian Dudley  MRN: [id number]  Epilepsy Attending: Charlsie Quest  Referring Physician/Provider: Erick Blinks, MD  Date: 05/24/2023 Duration: 27.20 mins  Patient history: 62 y.o. male with PMH significant for EtOh use, COPD, prior L frontoparietal stroke, HTN, who was transferred from outside hospital for concern for focal L hemispheric seizures after L carotid endarterectomy. EEG to evaluate for seizure.  Level of alertness: Awake  AEDs during EEG study: LEV  Technical aspects: This EEG study was done with scalp electrodes positioned according to the 10-20 International system of electrode placement. Electrical activity was reviewed with band pass filter of 1-70Hz , sensitivity of 7 uV/mm, display speed of 7mm/sec with a 60Hz  notched filter applied as appropriate. EEG data were recorded continuously and digitally stored.  Video monitoring was available and reviewed as appropriate.  Description: The posterior dominant rhythm consists of 8 Hz activity of moderate voltage (25-35 uV) seen predominantly in posterior head regions, asymmetric ( left<right) and reactive to eye opening and eye closing.  EEG showed continuous 2 to 3 Hz delta slowing in left hemisphere.  Frequent spikes were noted in left posterior quadrant.  Physiologic photic driving was not seen during photic stimulation.  Hyperventilation awas not performed.     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  Additionally there was cortical dysfunction arising from left hemisphere likely secondary to underlying stroke.  No seizures were seen during this time.   [name]

## 2023-05-24 NOTE — Plan of Care (Signed)

## 2023-05-24 NOTE — Progress Notes (Signed)
LTM maint complete - no skin breakdown under: Fp1 F7 Service A1 A2  [facility] monitored, Event button test confirmed by [facility].

## 2023-05-24 NOTE — H&P (Signed)
PCP:   Patient, No Pcp Per   Chief Complaint:  Possible focal nonocclusive seizures  HPI: Patient is a transfer from [facility] in [place].  This is a 62 year old male with past medical history of COPD, CVA, HTN, BPH, prostate cancer stage IV, history of stage III rectosigmoid cancer.  He had a recent CVA approximately 8 weeks ago and a left carotid endarterectomy recently.  Immediately postop patient did well, 2 days later he developed altered mentation.  MRI brain, CTA head and neck had no acute findings.  Teleneurology was consulted was concerned the patient was having focal hemispheric seizures. Routine EEG showed left sided slowing but no epileptiform discharges. He was noted to have some episodes of R arm shaking and was started on Keppra 1000mg  BID for seizures.  Patient had repeat MRI and EEG that was unrevealing.  Patient's confusion persisted.  Vascular surgeon discussed with neurology continuous EEG.  [facility] does not have an in-house neurologist or continuous EEG available available.  Dr. Bing Neighbors accepted patient for admission.   Patient seen, confused but mostly sleeping.  Unable to provide additional history.  Review of Systems:  Unable to obtain  Past Medical History: Past Medical History:  Diagnosis Date   BPH (benign prostatic hyperplasia)    COPD with emphysema (HCC)    CVA (cerebral vascular accident) (HCC)    Diverticulitis    History of alcohol abuse    History of colon cancer    History of tobacco abuse    Hypertension    Prostate cancer [facility])    Past Surgical History:  Procedure Laterality Date   CAROTID ENDARTERECTOMY Left 05/11/2023   TONSILLECTOMY      Medications: Prior to Admission medications   Medication Sig Start Date End Date Taking? Authorizing Provider  acetaminophen (TYLENOL) 325 MG tablet Take 2 tablets (650 mg total) by mouth every 6 (six) hours as needed for mild pain (or Fever >/= 101). 03/28/18    Elgergawy, Leana Roe, MD  ciprofloxacin (CIPRO) 500 MG tablet Take 1 tablet (500 mg total) by mouth 2 (two) times daily. 03/28/18   Elgergawy, Leana Roe, MD  folic acid (FOLVITE) 1 MG tablet Take 1 tablet (1 mg total) by mouth daily. 03/29/18   Elgergawy, Leana Roe, MD  lactobacillus acidophilus & bulgar (LACTINEX) chewable tablet Chew 1 tablet by mouth 3 (three) times daily with meals. 03/28/18   Elgergawy, Leana Roe, MD  metroNIDAZOLE (FLAGYL) 500 MG tablet Take 1 tablet (500 mg total) by mouth 3 (three) times daily. 03/28/18   Elgergawy, Leana Roe, MD  Multiple Vitamin (MULTIVITAMIN WITH MINERALS) TABS tablet Take 1 tablet by mouth daily. 03/29/18   Elgergawy, Leana Roe, MD  potassium chloride (K-DUR) 10 MEQ tablet Take 1 tablet (10 mEq total) by mouth daily. 03/28/18   Elgergawy, Leana Roe, MD  thiamine 100 MG tablet Take 1 tablet (100 mg total) by mouth daily. 03/29/18   Elgergawy, Leana Roe, MD    Allergies:  No Known Allergies  Social History:  reports that he has been smoking cigarettes. He has never used smokeless tobacco. He reports that he does not currently use alcohol. He reports that he does not currently use drugs.  Family History: No family history on file.  Physical Exam: Vitals:   05/23/23 2308  BP: 137/76  Pulse: 98  Resp: 18  Temp: 97.8 F (36.6 C)  TempSrc: Oral  SpO2: 100%    General:   Disoriented patient, well developed and  nourished, no acute distress Eyes: PERRL, no scleral icterus ENT: Dry oral mucosa, neck supple, no thyromegaly Lungs: CTA B/L, no wheeze, no crackles, no use of accessory muscles Cardiovascular: RRR, no regurgitation,  No carotid bruits, no JVD Abdomen: soft, positive BS, NTND, no organomegaly, not an acute abdomen GU: not examined Neuro: CN II - XII grossly intact,  Musculoskeletal: Unable to do a proper exam due to patient's disorientation.  Does not follow directions Skin: no rash, no subcutaneous crepitation, no decubitus Psych: Confused  patient   Labs on Admission:  Recent Labs    05/24/23 0251  NA 134*  K 3.7  CL 98  CO2 25  GLUCOSE 108*  BUN <5*  CREATININE 0.62  CALCIUM 9.2   Recent Labs    05/24/23 0251  AST 18  ALT 12  ALKPHOS 151*  BILITOT 0.5  PROT 6.9  ALBUMIN 2.7*    Recent Labs    05/24/23 0251  WBC 9.9  NEUTROABS 7.1  HGB 12.6*  HCT 39.4  MCV 87.9  PLT 486*    Recent Labs    05/24/23 0251  HGBA1C 6.3*    Recent Labs    05/24/23 0251  TSH 0.697   Recent Labs    05/24/23 0251  VITAMINB12 602  FOLATE 6.0    Radiological Exams on Admission: No results found.  Assessment/Plan Present on Admission:  Altered mental status -Questing seizures.  Neuro consulted.  Patient has been restarted on Keppra 1000 mg p.o. twice daily and thiamine Warnicke's dosing. -Continuous EEG ordered -Ativan as needed ordered seizures/sedation -Stroke precautions  Recent CVA -PT/OT consulted.  This may prove difficult/patient confused. -Aspirin, Plavix, atorvastatin, crestor, norvasc and metoprolol resumed -PT/OT consulted  HTN -Norvasc, metoprolol resumed  HLD -Crestor and atorvastatin resumed  BPH -Flomax resumed  COPD -Resume Trelegy  Prostate cancer -Per oncology  ,  05/24/2023, 5:55 AM

## 2023-05-24 NOTE — Progress Notes (Signed)
PROGRESS NOTE    Brian Dudley  ZOX:[id number] DOB: [birth year] DOA: 05/23/2023 PCP: Patient, No Pcp Per    Brief Narrative:   Brian Dudley is a 62 y.o. male with past medical history significant for COPD, CVA, HTN, BPH, stage IV prostate cancer, history of stage III rectosigmoid cancer, recent CVA approximately 8 weeks ago with recent left carotid endarterectomy who presented to [facility] as a transfer from [facility] hospital in [place] with concern of seizures.  Following his endarterectomy, patient was doing well postoperatively but 2 days later he developed altered mentation.  He [name] presented to [facility] with MRI brain, CTA head/neck with no acute findings.  Teleneurology was consulted given concern patient was having focal hemispheric seizures as EEG was showing left-sided slowing but no LF deform discharges.  He was noted to have some episodes of right arm shaking was started on Keppra 1000 mg twice daily for seizures.  Patient had repeat MRI and EEG that was unrevealing; although patient was confused and persisted.  [facility] vascular surgeon discussed with neurology who recommended transfer to [facility] as their facility does not have an in-house neurologist or continuous EEG available.  Patient was unable to give any further history due to his confusion.  Assessment & Plan:    Seizure Patient presented to [facility] with confusion, aphasia and intermittent right hand jerking.  Recent CVA and underwent a left carotid endarterectomy and symptoms onset 2 days following procedure.  Imaging at OSH with MRI and routine EEG were unrevealing.  Was started on Keppra 1000 mg BID and transferred to [facility] for in person neurology evaluation with continuous EEG. -- Neurology following, appreciate assistance -- EEG with spikes left posterior quadrant, continuous slow left hemisphere -- Keppra 1000 mg  BID -- cEEG -- Further per neurology  Recent CVA Hx carotid artery stenosis s/p left carotid endarterectomy -- Plavix 75 mg p.o. daily -- Aspirin 81 mg p.o. daily -- Crestor 40 mg p.o. daily  Essential hypertension -- Metoprolol to tartrate 12.5 mg p.o. twice daily -- Continue aspirin and statin  Hyperlipidemia -- Crestor 40 mg p.o. daily  BPH -- Continue Foley catheter, unclear if this is chronic or acutely placed at outside facility -- Continue tamsulosin 0.4 mg p.o. daily  Prediabetes Hemoglobin A1c 6.3.  Outpatient follow-up with PCP.  COPD Not oxygen dependent. -- Dulera 2 puffs twice daily (hospital substitution for Trelegy Ellipta)  History of prostate/rectosigmoid carcinoma -- Continue outpatient follow-up with [facility] oncology  Weakness/debility/deconditioning: -- OT recommending SNF -- PT eval: Pending  DVT prophylaxis: heparin injection 5,000 Units Start: 05/24/23 1400    Code Status: Full Code Family Communication: No family present at bedside, daughter updated via telephone this afternoon  Disposition Plan:  Level of care: Telemetry Medical Status is: Inpatient Remains inpatient appropriate because: Continuous EEG monitoring    Consultants:  Neurology  Procedures:  Continuous EEG  Antimicrobials:  None   Subjective: Patient seen examined bedside, resting calmly.  Lying in bed.  Remains confused but alert.  Continues on EEG.  No family present at bedside.  No other specific questions or concerns at this time.  Unable to obtain any further ROS due to his mentation.  No acute concerns overnight per nursing staff.  Daughter updated via telephone this afternoon  Objective: Vitals:   05/23/23 2308 05/24/23 0728 05/24/23 0850 05/24/23 1145  BP: 137/76 120/66  108/70  Pulse: 98 82  89  Resp:  18     Temp: 97.8 F (36.6 C) 98.1 F (36.7 C)  98.3 F (36.8 C)  TempSrc: Oral Oral  Oral  SpO2: 100% 93% 94% 99%    Intake/Output Summary (Last 24  hours) at 05/24/2023 1427 Last data filed at 05/24/2023 1221 Gross per 24 hour  Intake 222.23 ml  Output 825 ml  Net -602.77 ml   There were no vitals filed for this visit.  Examination:  Physical Exam: GEN: NAD, alert, poor dentition HEENT: NCAT, PERRL, EOMI, sclera clear, MMM PULM: CTAB w/o wheezes/crackles, normal respiratory effort, on room air CV: RRR w/o M/G/R GI: abd soft, NTND, + BS GU: Foley catheter noted MSK: no peripheral edema, moves all extremities independently NEURO: CN II-XII intact, no focal deficits PSYCH: normal mood/affect Integumentary: dry/intact, no rashes or wounds    Data Reviewed: I have personally reviewed following labs and imaging studies  CBC: Recent Labs  Lab 05/24/23 0251  WBC 9.9  NEUTROABS 7.1  HGB 12.6*  HCT 39.4  MCV 87.9  PLT 486*   Basic Metabolic Panel: Recent Labs  Lab 05/24/23 0251  NA 134*  K 3.7  CL 98  CO2 25  GLUCOSE 108*  BUN <5*  CREATININE 0.62  CALCIUM 9.2   GFR: CrCl cannot be calculated (Unknown ideal weight.). Liver Function Tests: Recent Labs  Lab 05/24/23 0251  AST 18  ALT 12  ALKPHOS 151*  BILITOT 0.5  PROT 6.9  ALBUMIN 2.7*   No results for input(s): "LIPASE", "AMYLASE" in the last 168 hours. Recent Labs  Lab 05/24/23 0251  AMMONIA <10   Coagulation Profile: No results for input(s): "INR", "PROTIME" in the last 168 hours. Cardiac Enzymes: No results for input(s): "CKTOTAL", "CKMB", "CKMBINDEX", "TROPONINI" in the last 168 hours. BNP (last 3 results) No results for input(s): "PROBNP" in the last 8760 hours. HbA1C: Recent Labs    05/24/23 0251  HGBA1C 6.3*   CBG: Recent Labs  Lab 05/24/23 0619 05/24/23 1208  GLUCAP 103* 166*   Lipid Profile: No results for input(s): "CHOL", "HDL", "LDLCALC", "TRIG", "CHOLHDL", "LDLDIRECT" in the last 72 hours. Thyroid Function Tests: Recent Labs    05/24/23 0251  TSH 0.697   Anemia Panel: Recent Labs    05/24/23 0251  VITAMINB12  602  FOLATE 6.0   Sepsis Labs: No results for input(s): "PROCALCITON", "LATICACIDVEN" in the last 168 hours.  No results found for this or any previous visit (from the past 240 hour(s)).       Radiology Studies: DG CHEST PORT 1 VIEW  Result Date: 05/24/2023 CLINICAL DATA:  Altered mental status EXAM: PORTABLE CHEST 1 VIEW COMPARISON:  None Available. FINDINGS: No pleural effusion. No pneumothorax. Normal cardiac and mediastinal contours. There are prominent bilateral opacities with a possible more focal airspace opacity in the right lung base. No radiographically apparent displaced rib fractures. Visualized upper abdomen is unremarkable. IMPRESSION: Likely background of chronic lung disease with a superimposed airspace opacity at the right lung base that could represent atelectasis or infection. Electronically Signed   By: Lorenza Cambridge M.D.   On: 05/24/2023 08:27   Overnight EEG with video  Result Date: 05/24/2023 Brian Quest, MD     05/24/2023  6:39 AM Patient Name: Brian Dudley MRN: [id number] Epilepsy Attending: [name] Referring Physician/Provider: Erick Blinks, MD Duration: 05/24/2023 0406 to 6962  Patient history: 62 y.o. male with PMH significant for EtOh use, COPD, prior L frontoparietal stroke, HTN, who was transferred from outside hospital for  concern for focal L hemispheric seizures after L carotid endarterectomy. EEG to evaluate for seizure.  Level of alertness: Awake, asleep  AEDs during EEG study: LEV  Technical aspects: This EEG study was done with scalp electrodes positioned according to the 10-20 International system of electrode placement. Electrical activity was reviewed with band pass filter of 1-70Hz , sensitivity of 7 uV/mm, display speed of 27mm/sec with a 60Hz  notched filter applied as appropriate. EEG data were recorded continuously and digitally stored.  Video monitoring was available and reviewed as appropriate.  Description: The posterior dominant  rhythm consists of 8 Hz activity of moderate voltage (25-35 uV) seen predominantly in posterior head regions, asymmetric ( left<right) and reactive to eye opening and eye closing.  Sleep was characterized by sleep spindles (12 to 15 Hz), maximal frontocentral region.  EEG showed continuous 3 to 5 Hz theta-delta slowing in left hemisphere.  Frequent spikes were noted in left posterior quadrant, qasi -periodic during sleep at 0.25-1Hz .  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  Additionally there was cortical dysfunction arising from left hemisphere likely secondary to underlying stroke.  No seizures were seen during this time.  Brian Dudley        Scheduled Meds:  amLODipine  5 mg Oral Daily   aspirin EC  81 mg Oral Daily   clopidogrel  75 mg Oral Daily   heparin  5,000 Units Subcutaneous Q8H   insulin aspart  0-9 Units Subcutaneous Q6H   levETIRAcetam  1,000 mg Oral BID   metoprolol tartrate  12.5 mg Oral BID   mometasone-formoterol  2 puff Inhalation BID   rosuvastatin  40 mg Oral Daily   tamsulosin  0.4 mg Oral Daily   [START ON 06/01/2023] thiamine (VITAMIN B1) injection  100 mg Intravenous Daily   Continuous Infusions:  dextrose 5% lactated ringers 100 mL/hr at 05/24/23 0315   levETIRAcetam     thiamine (VITAMIN B1) injection 500 mg (05/24/23 1235)   Followed by   Melene Muller ON 05/26/2023] thiamine (VITAMIN B1) injection       LOS: 1 day    Time spent: 51 minutes spent on chart review, discussion with nursing staff, consultants, updating family and interview/physical exam; more than 50% of that time was spent in counseling and/or coordination of care.    Brian Philips [place], DO [facility] Available via Epic secure chat 7am-7pm After these hours, please refer to coverage provider listed on  [email or web address] 05/24/2023, 2:27 PM

## 2023-05-25 DIAGNOSIS — Z8673 Personal history of transient ischemic attack (TIA), and cerebral infarction without residual deficits: Secondary | ICD-10-CM | POA: Diagnosis not present

## 2023-05-25 DIAGNOSIS — R569 Unspecified convulsions: Secondary | ICD-10-CM | POA: Diagnosis not present

## 2023-05-25 DIAGNOSIS — R4182 Altered mental status, unspecified: Secondary | ICD-10-CM

## 2023-05-25 DIAGNOSIS — R9401 Abnormal electroencephalogram [EEG]: Secondary | ICD-10-CM

## 2023-05-25 LAB — GLUCOSE, CAPILLARY
Glucose-Capillary: 112 mg/dL — ABNORMAL HIGH (ref 70–99)
Glucose-Capillary: 113 mg/dL — ABNORMAL HIGH (ref 70–99)
Glucose-Capillary: 140 mg/dL — ABNORMAL HIGH (ref 70–99)
Glucose-Capillary: 195 mg/dL — ABNORMAL HIGH (ref 70–99)

## 2023-05-25 MED ORDER — SODIUM CHLORIDE 0.9 % IV BOLUS
1000.0000 mL | Freq: Once | INTRAVENOUS | Status: AC
  Administered 2023-05-25: 1000 mL via INTRAVENOUS

## 2023-05-25 NOTE — Progress Notes (Signed)
   05/25/23 1454  Spiritual Encounters  Type of Visit Initial  Care provided to: Patient  Reason for visit Advance directives  OnCall Visit No   Chaplain responded to Spiritual Consult for Advance Care Directive.Upon arrival to the room the Pt declined ACD education, preferring instead to review document on his own.  Chaplain explained how to contact us if he had any questions.  Paperwork left with Pt.  Chaplain services remain available by Spiritual Consult or for emergent cases, paging [phone]  Chaplain Raelene Bott, [email or web address] [phone]

## 2023-05-25 NOTE — Plan of Care (Signed)
  Problem: Education: Goal: Knowledge of General Education information will improve Description: Including pain rating scale, medication(s)/side effects and non-pharmacologic comfort measures Outcome: Progressing   Problem: Nutrition: Goal: Adequate nutrition will be maintained Outcome: Progressing   Problem: Pain Managment: Goal: General experience of comfort will improve Outcome: Progressing   Problem: Safety: Goal: Ability to remain free from injury will improve Outcome: Progressing   Problem: Skin Integrity: Goal: Risk for impaired skin integrity will decrease Outcome: Progressing   Problem: Coping: Goal: Ability to adjust to condition or change in health will improve Outcome: Progressing

## 2023-05-25 NOTE — Progress Notes (Signed)
Pt has congested non productive cough, with crackles at bases and rhonchi. Pt's daughter states he is a smoker and has a cough at baseline. Nicotine patch applied overnight per daughter's request. Pt smokes about 1 PPD. CXR performed yesterday am. Encouraged pt to cough often. Pt has no difficulty with swallowing nectar thickened fluids. Aspiration precautions observed.  Pt's speech is more comprehensible, although he remains confused.

## 2023-05-25 NOTE — Procedures (Addendum)
Patient Name: Brian Dudley  MRN: [id number]  Epilepsy Attending: Charlsie Quest  Referring Physician/Provider: Erick Blinks, MD  Duration: 05/25/2023 0406 to 05/26/2023 0730   Patient history: 62 y.o. male with PMH significant for EtOh use, COPD, prior L frontoparietal stroke, HTN, who was transferred from outside hospital for concern for focal L hemispheric seizures after L carotid endarterectomy. EEG to evaluate for seizure.   Level of alertness: Awake, asleep   AEDs during EEG study: LEV   Technical aspects: This EEG study was done with scalp electrodes positioned according to the 10-20 International system of electrode placement. Electrical activity was reviewed with band pass filter of 1-70Hz , sensitivity of 7 uV/mm, display speed of 69mm/sec with a 60Hz  notched filter applied as appropriate. EEG data were recorded continuously and digitally stored.  Video monitoring was available and reviewed as appropriate.   Description: The posterior dominant rhythm consists of 8 Hz activity of moderate voltage (25-35 uV) seen predominantly in posterior head regions, asymmetric ( left<right) and reactive to eye opening and eye closing. Sleep was characterized by sleep spindles (12 to 15 Hz), maximal frontocentral region. EEG showed continuous 3 to 5 Hz theta-delta slowing in left hemisphere. Frequent spikes were noted in left posterior quadrant.  EEG was technically difficult between 05/25/2023  2123 to 05/26/2023 0045 as patient was pulling electrode.   ABNORMALITY - Spikes, left posterior quadrant - Continuous slow, left hemisphere - Background asymmetry, left<right   IMPRESSION: This study showed evidence of epileptogenicity arising from left posterior quadrant.  Additionally there was cortical dysfunction arising from left hemisphere likely secondary to underlying stroke.  No seizures were seen during this time.     [name] [name]

## 2023-05-25 NOTE — Progress Notes (Signed)
PROGRESS NOTE    Brian Dudley  WUJ:[id number] DOB: May 02, 1961 DOA: 05/23/2023 PCP: Patient, No Pcp Per    Brief Narrative:   Brian Dudley is a 62 y.o. male with past medical history significant for COPD, CVA, HTN, BPH, stage IV prostate cancer, history of stage III rectosigmoid cancer, recent CVA approximately 8 weeks ago with recent left carotid endarterectomy who presented to [facility] as a transfer from [facility] hospital in [place] with concern of seizures.  Following his endarterectomy, patient was doing well postoperatively but 2 days later he developed altered mentation.  He [name] presented to [facility] with MRI brain, CTA head/neck with no acute findings.  Teleneurology was consulted given concern patient was having focal hemispheric seizures as EEG was showing left-sided slowing but no LF deform discharges.  He was noted to have some episodes of right arm shaking was started on Keppra 1000 mg twice daily for seizures.  Patient had repeat MRI and EEG that was unrevealing; although patient was confused and persisted.  [facility] vascular surgeon discussed with neurology who recommended transfer to [facility] as their facility does not have an in-house neurologist or continuous EEG available.  Patient was unable to give any further history due to his confusion.  Assessment & Plan:    Seizure Patient presented to [facility] with confusion, aphasia and intermittent right hand jerking.  Recent CVA and underwent a left carotid endarterectomy and symptoms onset 2 days following procedure.  Imaging at OSH with MRI and routine EEG were unrevealing.  Was started on Keppra 1000 mg BID and transferred to [facility] for in person neurology evaluation with continuous EEG. -- Neurology following, appreciate assistance -- EEG with spikes left posterior quadrant, continuous slow left hemisphere -- Keppra 1000 mg  BID -- continues on LTM EEG -- Further per neurology  Recent CVA Hx carotid artery stenosis s/p left carotid endarterectomy -- Plavix 75 mg p.o. daily -- Aspirin 81 mg p.o. daily -- Crestor 40 mg p.o. daily  Essential hypertension -- Metoprolol to tartrate 12.5 mg p.o. twice daily -- Continue aspirin and statin  Hyperlipidemia -- Crestor 40 mg p.o. daily  BPH -- Continue tamsulosin 0.4 mg p.o. daily -- Foley catheter placed at OSH, discussed with patient's daughter and agree with voiding trial today, order placed for Foley removal with bladder scan as needed at next void after Foley removal or if no void in 6 hours  Prediabetes Hemoglobin A1c 6.3.  Outpatient follow-up with PCP.  COPD Not oxygen dependent. -- Dulera 2 puffs twice daily (hospital substitution for Trelegy Ellipta)  History of prostate/rectosigmoid carcinoma -- Continue outpatient follow-up with [facility] oncology  Weakness/debility/deconditioning: -- PT/OT recommending SNF -- TOC consulted for placement  DVT prophylaxis: heparin injection 5,000 Units Start: 05/24/23 1400    Code Status: Full Code Family Communication: No family present at bedside, daughter updated via telephone yesterday afternoon  Disposition Plan:  Level of care: Telemetry Medical Status is: Inpatient Remains inpatient appropriate because: Continuous EEG monitoring, will need SNF placement    Consultants:  Neurology  Procedures:  Continuous EEG  Antimicrobials:  None   Subjective: Patient seen examined bedside, resting calmly.  Lying in bed.  Remains confused but alert.  Continues on EEG.  Believes the year is 2023, when asked about who the president is his answer is "I am having seizures".  No family present at bedside.  No other specific questions or concerns at this time.  No acute concerns overnight per nursing staff.    Objective: Vitals:   05/24/23 2035 05/25/23 0052 05/25/23 0449 05/25/23 0737  BP:  (!) 143/76 (!)  148/72 118/64  Pulse: 95 92 98 (!) 112  Resp: 17 18 18 17   Temp:  99 F (37.2 C) 98.9 F (37.2 C) 98.5 F (36.9 C)  TempSrc:  Axillary Axillary Oral  SpO2:  96% 98% 97%  Height:    5\' 6"  (1.676 m)    Intake/Output Summary (Last 24 hours) at 05/25/2023 1038 Last data filed at 05/25/2023 0902 Gross per 24 hour  Intake 2875.02 ml  Output 3175 ml  Net -299.98 ml   There were no vitals filed for this visit.  Examination:  Physical Exam: GEN: NAD, alert, not oriented self but not to place/time/situation, poor dentition HEENT: NCAT, PERRL, EOMI, sclera clear, MMM PULM: CTAB w/o wheezes/crackles, normal respiratory effort, on room air CV: RRR w/o M/G/R GI: abd soft, NTND, + BS GU: Foley catheter noted draining clear yellow urine with small rare clots in collection bag MSK: no peripheral edema, moves all extremities independently NEURO: CN II-XII intact, no focal deficits PSYCH: normal mood/affect Integumentary: dry/intact, no rashes or wounds    Data Reviewed: I have personally reviewed following labs and imaging studies  CBC: Recent Labs  Lab 05/24/23 0251  WBC 9.9  NEUTROABS 7.1  HGB 12.6*  HCT 39.4  MCV 87.9  PLT 486*   Basic Metabolic Panel: Recent Labs  Lab 05/24/23 0251  NA 134*  K 3.7  CL 98  CO2 25  GLUCOSE 108*  BUN <5*  CREATININE 0.62  CALCIUM 9.2   GFR: CrCl cannot be calculated (Unknown ideal weight.). Liver Function Tests: Recent Labs  Lab 05/24/23 0251  AST 18  ALT 12  ALKPHOS 151*  BILITOT 0.5  PROT 6.9  ALBUMIN 2.7*   No results for input(s): "LIPASE", "AMYLASE" in the last 168 hours. Recent Labs  Lab 05/24/23 0251  AMMONIA <10   Coagulation Profile: No results for input(s): "INR", "PROTIME" in the last 168 hours. Cardiac Enzymes: No results for input(s): "CKTOTAL", "CKMB", "CKMBINDEX", "TROPONINI" in the last 168 hours. BNP (last 3 results) No results for input(s): "PROBNP" in the last 8760 hours. HbA1C: Recent Labs     05/24/23 0251  HGBA1C 6.3*   CBG: Recent Labs  Lab 05/24/23 0619 05/24/23 1208 05/24/23 1737 05/25/23 0019 05/25/23 0616  GLUCAP 103* 166* 174* 113* 195*   Lipid Profile: No results for input(s): "CHOL", "HDL", "LDLCALC", "TRIG", "CHOLHDL", "LDLDIRECT" in the last 72 hours. Thyroid Function Tests: Recent Labs    05/24/23 0251  TSH 0.697   Anemia Panel: Recent Labs    05/24/23 0251  VITAMINB12 602  FOLATE 6.0   Sepsis Labs: No results for input(s): "PROCALCITON", "LATICACIDVEN" in the last 168 hours.  Recent Results (from the past 240 hour(s))  Culture, blood (Routine X 2) w Reflex to ID Panel     Status: None (Preliminary result)   Collection Time: 05/24/23  2:49 AM   Specimen: BLOOD RIGHT HAND  Result Value Ref Range Status   Specimen Description BLOOD RIGHT HAND  Final   Special Requests   Final    BOTTLES DRAWN AEROBIC AND ANAEROBIC Blood Culture results may not be optimal due to an inadequate volume of blood received in culture bottles   Culture   Final    NO GROWTH 1 DAY Performed at [facility], [address] [place], [place] [address]  Report Status PENDING  Incomplete  Culture, blood (Routine X 2) w Reflex to ID Panel     Status: None (Preliminary result)   Collection Time: 05/24/23  2:51 AM   Specimen: BLOOD  Result Value Ref Range Status   Specimen Description BLOOD RIGHT ANTECUBITAL  Final   Special Requests   Final    BOTTLES DRAWN AEROBIC AND ANAEROBIC Blood Culture results may not be optimal due to an inadequate volume of blood received in culture bottles   Culture   Final    NO GROWTH 1 DAY Performed at [facility], [address] [place], [place] [address]    Report Status PENDING  Incomplete         Radiology Studies: DG CHEST PORT 1 VIEW  Result Date: 05/24/2023 CLINICAL DATA:  Altered mental status EXAM: PORTABLE CHEST 1 VIEW COMPARISON:  None Available. FINDINGS: No pleural effusion. No pneumothorax. Normal  cardiac and mediastinal contours. There are prominent bilateral opacities with a possible more focal airspace opacity in the right lung base. No radiographically apparent displaced rib fractures. Visualized upper abdomen is unremarkable. IMPRESSION: Likely background of chronic lung disease with a superimposed airspace opacity at the right lung base that could represent atelectasis or infection. Electronically Signed   By: Lorenza Cambridge M.D.   On: 05/24/2023 08:27   Overnight EEG with video  Result Date: 05/24/2023 Charlsie Quest, MD     05/25/2023  9:41 AM Patient Name: Brian Dudley MRN: [id number] Epilepsy Attending: Charlsie Quest Referring Physician/Provider: Erick Blinks, MD Duration: 05/24/2023 0406 to 05/25/2023 0406  Patient history: 62 y.o. male with PMH significant for EtOh use, COPD, prior L frontoparietal stroke, HTN, who was transferred from outside hospital for concern for focal L hemispheric seizures after L carotid endarterectomy. EEG to evaluate for seizure.  Level of alertness: Awake, asleep  AEDs during EEG study: LEV  Technical aspects: This EEG study was done with scalp electrodes positioned according to the 10-20 International system of electrode placement. Electrical activity was reviewed with band pass filter of 1-70Hz , sensitivity of 7 uV/mm, display speed of 37mm/sec with a 60Hz  notched filter applied as appropriate. EEG data were recorded continuously and digitally stored.  Video monitoring was available and reviewed as appropriate.  Description: The posterior dominant rhythm consists of 8 Hz activity of moderate voltage (25-35 uV) seen predominantly in posterior head regions, asymmetric ( left<right) and reactive to eye opening and eye closing. Sleep was characterized by sleep spindles (12 to 15 Hz), maximal frontocentral region. EEG showed continuous 3 to 5 Hz theta-delta slowing in left hemisphere. Frequent spikes were noted in left posterior quadrant, qasi -periodic  during sleep at 0.25-1Hz .  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  Additionally there was cortical dysfunction arising from left hemisphere likely secondary to underlying stroke.  No seizures were seen during this time.  Priyanka [name]        Scheduled Meds:  amLODipine  5 mg Oral Daily   aspirin EC  81 mg Oral Daily   clopidogrel  75 mg Oral Daily   heparin  5,000 Units Subcutaneous Q8H   insulin aspart  0-9 Units Subcutaneous Q6H   levETIRAcetam  1,000 mg Oral BID   metoprolol tartrate  12.5 mg Oral BID   mometasone-formoterol  2 puff Inhalation BID   nicotine  21 mg Transdermal  Daily   rosuvastatin  40 mg Oral Daily   sodium chloride flush  10-40 mL Intracatheter Q12H   tamsulosin  0.4 mg Oral Daily   [START ON 06/01/2023] thiamine (VITAMIN B1) injection  100 mg Intravenous Daily   Continuous Infusions:  dextrose 5% lactated ringers 100 mL/hr at 05/25/23 0452   levETIRAcetam     thiamine (VITAMIN B1) injection 500 mg (05/25/23 1610)   Followed by   Melene Muller ON 05/26/2023] thiamine (VITAMIN B1) injection       LOS: 2 days    Time spent: 51 minutes spent on chart review, discussion with nursing staff, consultants, updating family and interview/physical exam; more than 50% of that time was spent in counseling and/or coordination of care.    Alvira Philips [place], DO [facility] Available via Epic secure chat 7am-7pm After these hours, please refer to coverage provider listed on [email or web address] 05/25/2023, 10:38 AM

## 2023-05-25 NOTE — Progress Notes (Signed)
Neurology progress note  S: No subjective complaints. No clinical seizure activity.   O:  Vitals:   05/25/23 2016 05/25/23 2022  BP: (!) 155/72   Pulse: (!) 117 (!) 105  Resp: 16 18  Temp: 98.5 F (36.9 C)   SpO2: 98% 98%   General: Laying comfortably in bed; easily gets agitated. HENT: Normal oropharynx and mucosa. Normal external appearance of ears and nose.  Neck: Supple, no pain or tenderness  CV: No JVD. No peripheral edema.  Pulmonary: Symmetric Chest rise. Normal respiratory effort.  Abdomen: Soft to touch, non-tender.  Ext: No cyanosis, edema, or deformity  Skin: No rash. Normal palpation of skin.   Musculoskeletal: Normal digits and nails by inspection. No clubbing.    Neurologic Examination  Mental status/Cognition: drosy, oriented to self only. Poor attention. Speech/language: dysarhtic, non fluent, comprehension intact intermittently to simple commands, does not name objects. Cranial nerves:   CN II Pupils equal and reactive to light, blinks to threat BL   CN III,IV,VI makes eye contact on left and right. No nystagmus.   CN V Corneals intact BL   CN VII Symmetric facial smile and grimace.   CN VIII Makes eye contact to speech. Althou concerns that he maybe hard of hearing.   CN IX & X Seems to be protecting his airway   CN XI Head midline.   CN XII midline tongue protrusion    Motor:  Muscle bulk: poor, tone normal. Unable to do detailed strength testing secondary to encephalopathy and concern for aphasia. Moves LUE and LLE spontaneously. Will move all extremities to command. RUE with some antigravity movement but LUE is much more robust.   Sensation:  Light touch Grossly intact BL   Pin prick     Temperature     Vibration    Proprioception      Coordination/Complex Motor:  - Finger to Nose intact on the left, would not try FTN with RUE despite repeatedly asking him. - Heel to shin unable to assess due to confusion. - Rapid alternating movement are  slowed throughout - Gait: deferred for patient safety.  Data  Overnight EEG 8/14-8/15/24: - Spikes, left posterior quadrant - Continuous slow, left hemisphere - Background asymmetry, left<right   Unable to review images from OSH. Read radiology reports with no acute abnormalities noted on CTH, CTA, MRI Brains. Noted remote L frontoparietal encephalomalacia.  A/P:  Brian Dudley is a 62 y.o. male with PMH significant for EtOh use, COPD, prior L frontoparietal stroke, HTN, who was transferred from outside hospital for concern for focal L hemispheric seizures after L carotid endarterectomy. He was initially at his baseline post op, then noted to have worsening R sided weakness, aphasia and confusion with intermittent R hand jerking. Imaging at OSH with MRIs and routine EEGs were non revealing per transferring MD but the images are not available for our review. He was started on Keppra 1000mg  BID and transferred for [facility] neurology evaluation and cEEG. LTM x36 hrs since transfer has showed spikes in the left posterior quadrant but no seizures.  Plan - Continue LTM one more day. If no seizure activity plan to d/c LTM and repeat MRI brain wwo - Will continue to follow  Bing Neighbors, MD [facility] [phone]  If 7pm- 7am, please page neurology on call as listed in AMION.

## 2023-05-26 DIAGNOSIS — R9401 Abnormal electroencephalogram [EEG]: Secondary | ICD-10-CM | POA: Diagnosis not present

## 2023-05-26 DIAGNOSIS — Z8673 Personal history of transient ischemic attack (TIA), and cerebral infarction without residual deficits: Secondary | ICD-10-CM | POA: Diagnosis not present

## 2023-05-26 DIAGNOSIS — R569 Unspecified convulsions: Secondary | ICD-10-CM | POA: Diagnosis not present

## 2023-05-26 DIAGNOSIS — R4182 Altered mental status, unspecified: Secondary | ICD-10-CM | POA: Diagnosis not present

## 2023-05-26 LAB — BASIC METABOLIC PANEL
Anion gap: 7 (ref 5–15)
BUN: <5 mg/dL — ABNORMAL LOW (ref 8–23)
CO2: 26 mmol/L (ref 22–32)
Calcium: 9.1 mg/dL (ref 8.9–10.3)
Chloride: 105 mmol/L (ref 98–111)
Creatinine, Ser: 0.56 mg/dL — ABNORMAL LOW (ref 0.61–1.24)
GFR, Estimated: >60 mL/min (ref >60)
Glucose, Bld: 110 mg/dL — ABNORMAL HIGH (ref 70–99)
Potassium: 4.2 mmol/L (ref 3.5–5.1)
Sodium: 138 mmol/L (ref 135–145)

## 2023-05-26 LAB — GLUCOSE, CAPILLARY
Glucose-Capillary: 129 mg/dL — ABNORMAL HIGH (ref 70–99)
Glucose-Capillary: 133 mg/dL — ABNORMAL HIGH (ref 70–99)
Glucose-Capillary: 143 mg/dL — ABNORMAL HIGH (ref 70–99)
Glucose-Capillary: 148 mg/dL — ABNORMAL HIGH (ref 70–99)

## 2023-05-26 LAB — CBC
HCT: 36.3 % — ABNORMAL LOW (ref 39.0–52.0)
Hemoglobin: 11.6 g/dL — ABNORMAL LOW (ref 13.0–17.0)
MCH: 28.3 pg (ref 26.0–34.0)
MCHC: 32 g/dL (ref 30.0–36.0)
MCV: 88.5 fL (ref 80.0–100.0)
Platelets: 431 10*3/uL — ABNORMAL HIGH (ref 150–400)
RBC: 4.1 MIL/uL — ABNORMAL LOW (ref 4.22–5.81)
RDW: 12.9 % (ref 11.5–15.5)
WBC: 7.2 10*3/uL (ref 4.0–10.5)
nRBC: 0 % (ref 0.0–0.2)

## 2023-05-26 NOTE — Progress Notes (Signed)
PROGRESS NOTE    Brian Dudley  ZOX:[id number] DOB: 10-24-1960 DOA: 05/23/2023 PCP: Patient, No Pcp Per    Brief Narrative:   Nycholas Dudley is a 62 y.o. male with past medical history significant for COPD, CVA, HTN, BPH, stage IV prostate cancer, history of stage III rectosigmoid cancer, recent CVA approximately 8 weeks ago with recent left carotid endarterectomy who presented to [facility] as a transfer from [facility] hospital in [place] with concern of seizures.  Following his endarterectomy, patient was doing well postoperatively but 2 days later he developed altered mentation.  He [name] presented to [facility] with MRI brain, CTA head/neck with no acute findings.  Teleneurology was consulted given concern patient was having focal hemispheric seizures as EEG was showing left-sided slowing but no LF deform discharges.  He was noted to have some episodes of right arm shaking was started on Keppra 1000 mg twice daily for seizures.  Patient had repeat MRI and EEG that was unrevealing; although patient was confused and persisted.  [facility] vascular surgeon discussed with neurology who recommended transfer to [facility] as their facility does not have an in-house neurologist or continuous EEG available.  Patient was unable to give any further history due to his confusion.  Assessment & Plan:    Seizure Patient presented to [facility] with confusion, aphasia and intermittent right hand jerking.  Recent CVA and underwent a left carotid endarterectomy and symptoms onset 2 days following procedure.  Imaging at OSH with MRI and routine EEG were unrevealing.  Was started on Keppra 1000 mg BID and transferred to [facility] for in person neurology evaluation with continuous EEG. -- Neurology following, appreciate assistance -- EEG with spikes left posterior quadrant, continuous slow left hemisphere -- Keppra 1000 mg  BID -- Neurology plans to discontinue EEG today, once off MRI brain with and without contrast -- Further per neurology  Recent CVA Hx carotid artery stenosis s/p left carotid endarterectomy -- Plavix 75 mg p.o. daily -- Aspirin 81 mg p.o. daily -- Crestor 40 mg p.o. daily  Essential hypertension -- Metoprolol to tartrate 12.5 mg p.o. twice daily -- Continue aspirin and statin  Hyperlipidemia -- Crestor 40 mg p.o. daily  BPH -- Continue tamsulosin 0.4 mg p.o. daily -- Foley catheter removed 8/15, previously placed at OSH -- Closely monitor urine output  Prediabetes Hemoglobin A1c 6.3.  Outpatient follow-up with PCP.  COPD Not oxygen dependent. -- Dulera 2 puffs twice daily (hospital substitution for Trelegy Ellipta)  History of prostate/rectosigmoid carcinoma -- Continue outpatient follow-up with [facility] oncology  Weakness/debility/deconditioning: -- PT/OT recommending SNF -- TOC consulted for placement  DVT prophylaxis: heparin injection 5,000 Units Start: 05/24/23 1400    Code Status: Full Code Family Communication: No family present at bedside  Disposition Plan:  Level of care: Telemetry Medical Status is: Inpatient Remains inpatient appropriate because:  will need SNF placement    Consultants:  Neurology  Procedures:  Continuous EEG  Antimicrobials:  None   Subjective: Patient seen examined bedside, resting calmly.  Lying in bed.  Remains confused but alert.  Continues on EEG with neurology plan to discontinue later today.  Believes the year is 2021, when asked where he is states "[place] the [place]".    No family present at bedside.  No other specific questions or concerns at this time.   No acute concerns overnight per nursing staff.    Objective: Vitals:   05/25/23 2016 05/25/23 2022  05/26/23 0350 05/26/23 0809  BP: (!) 155/72  (!) 148/76 135/76  Pulse: (!) 117 (!) 105 (!) 101 (!) 104  Resp: 16 18 18 18   Temp: 98.5 F (36.9 C)  97.9 F (36.6 C) 98.6 F  (37 C)  TempSrc: Axillary  Axillary Oral  SpO2: 98% 98% 98% 99%  Height:        Intake/Output Summary (Last 24 hours) at 05/26/2023 1137 Last data filed at 05/26/2023 4098 Gross per 24 hour  Intake 410 ml  Output 3400 ml  Net -2990 ml   There were no vitals filed for this visit.  Examination:  Physical Exam: GEN: NAD, alert, oriented self but not to place ("[place] the world)/time (2021)/situation, person (president: "I threw him out"), poor dentition HEENT: NCAT, PERRL, EOMI, sclera clear, MMM PULM: CTAB w/o wheezes/crackles, normal respiratory effort, on room air CV: RRR w/o M/G/R GI: abd soft, NTND, + BS MSK: no peripheral edema, moves all extremities independently NEURO: CN II-XII intact, no focal deficits other than his persistent confusion PSYCH: normal mood/affect Integumentary: dry/intact, no rashes or wounds    Data Reviewed: I have personally reviewed following labs and imaging studies  CBC: Recent Labs  Lab 05/24/23 0251 05/26/23 0357  WBC 9.9 7.2  NEUTROABS 7.1  --   HGB 12.6* 11.6*  HCT 39.4 36.3*  MCV 87.9 88.5  PLT 486* 431*   Basic Metabolic Panel: Recent Labs  Lab 05/24/23 0251 05/26/23 0357  NA 134* 138  K 3.7 4.2  CL 98 105  CO2 25 26  GLUCOSE 108* 110*  BUN <5* <5*  CREATININE 0.62 0.56*  CALCIUM 9.2 9.1   GFR: CrCl cannot be calculated (Unknown ideal weight.). Liver Function Tests: Recent Labs  Lab 05/24/23 0251  AST 18  ALT 12  ALKPHOS 151*  BILITOT 0.5  PROT 6.9  ALBUMIN 2.7*   No results for input(s): "LIPASE", "AMYLASE" in the last 168 hours. Recent Labs  Lab 05/24/23 0251  AMMONIA <10   Coagulation Profile: No results for input(s): "INR", "PROTIME" in the last 168 hours. Cardiac Enzymes: No results for input(s): "CKTOTAL", "CKMB", "CKMBINDEX", "TROPONINI" in the last 168 hours. BNP (last 3 results) No results for input(s): "PROBNP" in the last 8760 hours. HbA1C: Recent Labs    05/24/23 0251  HGBA1C 6.3*    CBG: Recent Labs  Lab 05/25/23 0616 05/25/23 1205 05/25/23 1728 05/25/23 2358 05/26/23 0623  GLUCAP 195* 112* 140* 148* 129*   Lipid Profile: No results for input(s): "CHOL", "HDL", "LDLCALC", "TRIG", "CHOLHDL", "LDLDIRECT" in the last 72 hours. Thyroid Function Tests: Recent Labs    05/24/23 0251  TSH 0.697   Anemia Panel: Recent Labs    05/24/23 0251  VITAMINB12 602  FOLATE 6.0   Sepsis Labs: No results for input(s): "PROCALCITON", "LATICACIDVEN" in the last 168 hours.  Recent Results (from the past 240 hour(s))  Culture, blood (Routine X 2) w Reflex to ID Panel     Status: None (Preliminary result)   Collection Time: 05/24/23  2:49 AM   Specimen: BLOOD RIGHT HAND  Result Value Ref Range Status   Specimen Description BLOOD RIGHT HAND  Final   Special Requests   Final    BOTTLES DRAWN AEROBIC AND ANAEROBIC Blood Culture results may not be optimal due to an inadequate volume of blood received in culture bottles   Culture   Final    NO GROWTH 2 DAYS Performed at [facility], [address], [place], [place] [address]  Report Status PENDING  Incomplete  Culture, blood (Routine X 2) w Reflex to ID Panel     Status: None (Preliminary result)   Collection Time: 05/24/23  2:51 AM   Specimen: BLOOD  Result Value Ref Range Status   Specimen Description BLOOD RIGHT ANTECUBITAL  Final   Special Requests   Final    BOTTLES DRAWN AEROBIC AND ANAEROBIC Blood Culture results may not be optimal due to an inadequate volume of blood received in culture bottles   Culture   Final    NO GROWTH 2 DAYS Performed at [facility], [address], [place], [place] [address]    Report Status PENDING  Incomplete         Radiology Studies: No results found.      Scheduled Meds:  amLODipine  5 mg Oral Daily   aspirin EC  81 mg Oral Daily   clopidogrel  75 mg Oral Daily   heparin  5,000 Units Subcutaneous Q8H   insulin aspart  0-9 Units Subcutaneous Q6H    levETIRAcetam  1,000 mg Oral BID   metoprolol tartrate  12.5 mg Oral BID   mometasone-formoterol  2 puff Inhalation BID   nicotine  21 mg Transdermal Daily   rosuvastatin  40 mg Oral Daily   sodium chloride flush  10-40 mL Intracatheter Q12H   tamsulosin  0.4 mg Oral Daily   [START ON 06/01/2023] thiamine (VITAMIN B1) injection  100 mg Intravenous Daily   Continuous Infusions:  dextrose 5% lactated ringers 100 mL/hr at 05/25/23 0452   levETIRAcetam     thiamine (VITAMIN B1) injection 250 mg (05/26/23 0833)     LOS: 3 days    Time spent: 51 minutes spent on chart review, discussion with nursing staff, consultants, updating family and interview/physical exam; more than 50% of that time was spent in counseling and/or coordination of care.    Alvira Philips [place], DO [facility] Available via Epic secure chat 7am-7pm After these hours, please refer to coverage provider listed on [email or web address] 05/26/2023, 11:37 AM

## 2023-05-26 NOTE — Care Management Important Message (Signed)
Important Message  Patient Details  Name: Brian Dudley MRN: [id number] Date of Birth: Sep 25, 1961   Medicare Important Message Given:  Yes     Krishana Lutze 05/26/2023, 3:02 PM

## 2023-05-26 NOTE — TOC Progression Note (Signed)
Transition of Care [facility]) - Progression Note    Patient Details  Name: Brian Dudley MRN: [id number] Date of Birth: 02/21/1961  Transition of Care [facility]) CM/SW Contact  [name], [place] Phone Number: 05/26/2023, 3:01 PM  Clinical Narrative:   CSW faxed out referral for SNF. CSW contacted daughter, [name], to discuss SNF options. [name] to review over the weekend and will have a decision made come Monday on where she would like the patient to go for SNF. CSW to follow.    Expected Discharge Plan: Skilled Nursing Facility Barriers to Discharge: Continued Medical Work up, English as a second language teacher  Expected Discharge Plan and Services     Post Acute Care Choice: Skilled Nursing Facility Living arrangements for the past 2 months: Single Family Home                                       Social Determinants of Health (SDOH) Interventions SDOH Screenings   Food Insecurity: Patient Unable To Answer (05/24/2023)  Housing: Patient Unable To Answer (05/24/2023)  Transportation Needs: No Transportation Needs (09/09/2019)   Received from [facility]  Utilities: Patient Unable To Answer (05/24/2023)  Financial Resource Strain: Low Risk  (09/09/2019)   Received from [facility]  Physical Activity: Inactive (11/07/2018)   Received from [facility]  Social Connections: Unknown (12/06/2022)   Received from [facility]  Stress: Stress Concern Present (11/07/2018)   Received from [facility]  Tobacco Use: High Risk (05/24/2023)    Readmission Risk Interventions     No data to display

## 2023-05-26 NOTE — Progress Notes (Signed)
Physical Therapy Treatment Patient Details Name: Brian Dudley MRN: [id number] DOB: [birth year] Today's Date: 05/26/2023   History of Present Illness Pt is a 62 y/o male transferred 05/24/23 from [facility] in [place], [place]. Recent L carotid endarterectomy (8/2) with development of AMS and concern for focal hemispheric seizures. MRI Brain w/o contrast and CTA which were non revealing. EEG with evidence of epileptogenicity arising from left posterior quadrant. PMH: CVA (8 weeks ago per chart), COPD, hx of alcohol abuse, tobacco abuse, HTN, prostate cancer.    PT Comments  Pt greeted resting in bed, eager for OOB mobility with continued progress towards acute goals. Pt continues to require cues for sequencing for all mobility with pt demonstrating good initiation, however poor problem solving. Pt requiring mod A to complete bed mobility grossly min A to come to stand with at least single UE support. Pt able to step pivot to recliner this session with max A as pt attempting to sit prematurely needing max cues for safety. Pt continues to be limited by R hemi-weakness, poor coordination, impaired cognition, decreased activity tolerance and poor balance/postural reactions. Pt continues to benefit from skilled PT services to progress toward functional mobility goals.      If plan is discharge home, recommend the following: Two people to help with walking and/or transfers;A lot of help with bathing/dressing/bathroom;Assistance with cooking/housework;Direct supervision/assist for medications management;Direct supervision/assist for financial management;Help with stairs or ramp for entrance;Assist for transportation;Supervision due to cognitive status   Can travel by private vehicle     No  Equipment Recommendations  Other (comment) (defer to next venue of care)    Recommendations for Other Services       Precautions / Restrictions Precautions Precautions: Fall;Other  (comment) Precaution Comments: continuous EEG Restrictions Weight Bearing Restrictions: No     Mobility  Bed Mobility Overal bed mobility: Needs Assistance Bed Mobility: Supine to Sit, Sit to Supine     Supine to sit: Mod assist, HOB elevated, Used rails     General bed mobility comments: some automatic reaching for bedrail with LUE and bringing BLE to EOB, mod assist for trunk to sit up L EOB.    Transfers Overall transfer level: Needs assistance Equipment used: Rolling walker (2 wheels), None Transfers: Sit to/from Stand, Bed to chair/wheelchair/BSC Sit to Stand: Mod assist, Min assist   Step pivot transfers: Max assist       General transfer comment: mod A to stand with RW initially, progressing to min A with practivce x20 throughout session, max A to step and pivot 180 degrees to chair as pt attempting to sit prematurely    Ambulation/Gait Ambulation/Gait assistance: Mod assist Gait Distance (Feet): 2 Feet Assistive device: Rolling walker (2 wheels) Gait Pattern/deviations: Step-to pattern, Decreased step length - right, Decreased step length - left, Decreased stride length, Decreased dorsiflexion - right, Decreased weight shift to right, Knees buckling Gait velocity: reduced     General Gait Details: Mod A  for weight shifting, balance, and RW management for side stepping to L along bedside   Stairs             Wheelchair Mobility     Tilt Bed    Modified Rankin (Stroke Patients Only) Modified Rankin (Stroke Patients Only) Pre-Morbid Rankin Score: Slight disability Modified Rankin: Moderately severe disability     Balance Overall balance assessment: Needs assistance Sitting-balance support: No upper extremity supported, Feet supported Sitting balance-Leahy Scale: Fair     Standing balance support: Bilateral upper extremity  supported, During functional activity Standing balance-Leahy Scale: Poor Standing balance comment: reliant on at least  single UE support                            Cognition Arousal: Alert Behavior During Therapy: Flat affect, Impulsive Overall Cognitive Status: No family/caregiver present to determine baseline cognitive functioning                                 General Comments: some what impulsive/eager to be up OOB, able to state name        Exercises Other Exercises Other Exercises: serial sit<>stand with LUE on upper bedrial for support x10, sit<>stand with LLE on trifolded fall mat for empasis on increased weight bearing and strength of RLE x10    General Comments        Pertinent Vitals/Pain Pain Assessment Pain Assessment: Faces Faces Pain Scale: Hurts a little bit Pain Location: generalized with RUE movement, unable to report location of pain Pain Descriptors / Indicators: Grimacing, Guarding Pain Intervention(s): Monitored during session, Limited activity within patient's tolerance    Home Living                          Prior Function            PT Goals (current goals can now be found in the care plan section) Acute Rehab PT Goals PT Goal Formulation: With patient Time For Goal Achievement: 06/07/23 Progress towards PT goals: Progressing toward goals    Frequency    Min 1X/week      PT Plan      Co-evaluation              AM-PAC PT "6 Clicks" Mobility   Outcome Measure  Help needed turning from your back to your side while in a flat bed without using bedrails?: A Lot Help needed moving from lying on your back to sitting on the side of a flat bed without using bedrails?: A Lot Help needed moving to and from a bed to a chair (including a wheelchair)?: A Lot Help needed standing up from a chair using your arms (e.g., wheelchair or bedside chair)?: A Lot Help needed to walk in hospital room?: Total Help needed climbing 3-5 steps with a railing? : Total 6 Click Score: 10    End of Session Equipment Utilized During  Treatment: Gait belt Activity Tolerance: Patient tolerated treatment well Patient left: in chair;with call bell/phone within reach;with chair alarm set Nurse Communication: Mobility status PT Visit Diagnosis: Unsteadiness on feet (R26.81);Other abnormalities of gait and mobility (R26.89);Muscle weakness (generalized) (M62.81);Difficulty in walking, not elsewhere classified (R26.2);Other symptoms and signs involving the nervous system (R29.898)     Time: 1660-6301 PT Time Calculation (min) (ACUTE ONLY): 26 min  Charges:    $Therapeutic Exercise: 8-22 mins $Therapeutic Activity: 8-22 mins PT General Charges $$ ACUTE PT VISIT: 1 Visit                     Marcel Gary R. PTA Acute Rehabilitation Services Office: [phone]   Catalina Antigua 05/26/2023, 11:09 AM

## 2023-05-26 NOTE — NC FL2 (Signed)
[place] MEDICAID FL2 LEVEL OF CARE FORM     IDENTIFICATION  Patient Name: Brian Dudley Birthdate: 1961/06/29 Sex: male Admission Date (Current Location): 05/23/2023  [place] and [place] Number:   Veatrice [place], [place])   Facility and Address:  [facility], [address], [place], [place] [address]      Provider Number: [id number]  Attending Physician Name and Address:  [place], Alvira Philips, DO  Relative Name and Phone Number:       Current Level of Care: Hospital Recommended Level of Care: Skilled Nursing Facility Prior Approval Number:    Date Approved/Denied:   PASRR Number: [id number]  Discharge Plan: SNF    Current Diagnoses: Patient Active Problem List   Diagnosis Date Noted   COPD (chronic obstructive pulmonary disease) (HCC) 05/24/2023   Tobacco abuse 05/24/2023   HLD (hyperlipidemia) 05/24/2023   BPH (benign prostatic hyperplasia) 05/24/2023   Prostate cancer (HCC) 05/24/2023   History of malignant neoplasm of rectosigmoid junction 05/24/2023   Altered mental status 05/24/2023   Colonic diverticular abscess    Perforation of sigmoid colon due to diverticulitis 03/24/2018    Orientation RESPIRATION BLADDER Height & Weight      (disoriented)  Normal Incontinent Weight:   Height:  5\' 6"  (167.6 cm)  BEHAVIORAL SYMPTOMS/MOOD NEUROLOGICAL BOWEL NUTRITION STATUS    Convulsions/Seizures Incontinent Diet (see DC summary)  AMBULATORY STATUS COMMUNICATION OF NEEDS Skin   Extensive Assist Verbally Normal                       Personal Care Assistance Level of Assistance  Bathing, Feeding, Dressing Bathing Assistance: Limited assistance Feeding assistance: Limited assistance Dressing Assistance: Limited assistance     Functional Limitations Info  Speech     Speech Info: Impaired (dysarthria)    SPECIAL CARE FACTORS FREQUENCY  PT (By licensed PT), OT (By licensed OT)     PT Frequency: 5x/wk OT Frequency: 5x/wk             Contractures Contractures Info: Not present    Additional Factors Info  Code Status, Allergies, Insulin Sliding Scale Code Status Info: Full Allergies Info: NKA   Insulin Sliding Scale Info: see DC summary       Current Medications (05/26/2023):  This is the current hospital active medication list Current Facility-Administered Medications  Medication Dose Route Frequency Provider Last Rate Last Admin   acetaminophen (TYLENOL) tablet 650 mg  650 mg Oral Q6H PRN Crosley, Debby, MD       Or   acetaminophen (TYLENOL) suppository 650 mg  650 mg Rectal Q6H PRN Crosley, Debby, MD       amLODipine (NORVASC) tablet 5 mg  5 mg Oral Daily Crosley, Debby, MD   5 mg at 05/26/23 5621   aspirin EC tablet 81 mg  81 mg Oral Daily Crosley, Debby, MD   81 mg at 05/26/23 3086   bisacodyl (DULCOLAX) suppository 10 mg  10 mg Rectal Daily PRN Gery Pray, MD       clopidogrel (PLAVIX) tablet 75 mg  75 mg Oral Daily Crosley, Debby, MD   75 mg at 05/26/23 0822   dextrose 5 % in lactated ringers infusion   Intravenous Continuous Crosley, Debby, MD 100 mL/hr at 05/25/23 0452 New Bag at 05/25/23 0452   heparin injection 5,000 Units  5,000 Units Subcutaneous Q8H Crosley, Debby, MD   5,000 Units at 05/26/23 0642   insulin aspart (novoLOG) injection 0-9 Units  0-9 Units  Subcutaneous Q6H Gery Pray, MD   1 Units at 05/26/23 0643   levETIRAcetam (KEPPRA) tablet 1,000 mg  1,000 mg Oral BID Erick Blinks, MD   1,000 mg at 05/26/23 2952   Or   levETIRAcetam (KEPPRA) IVPB 1000 mg/100 mL premix  1,000 mg Intravenous BID Erick Blinks, MD       LORazepam (ATIVAN) tablet 1-4 mg  1-4 mg Oral Q1H PRN Gery Pray, MD       Or   LORazepam (ATIVAN) injection 1-4 mg  1-4 mg Intravenous Q1H PRN Crosley, Debby, MD       LORazepam (ATIVAN) injection 2 mg  2 mg Intravenous Once PRN Crosley, Debby, MD       metoprolol tartrate (LOPRESSOR) injection 5 mg  5 mg Intravenous Q4H PRN Crosley, Debby, MD        metoprolol tartrate (LOPRESSOR) tablet 12.5 mg  12.5 mg Oral BID Crosley, Debby, MD   12.5 mg at 05/26/23 0823   mometasone-formoterol (DULERA) 200-5 MCG/ACT inhaler 2 puff  2 puff Inhalation BID Crosley, Debby, MD   2 puff at 05/26/23 0823   nicotine (NICODERM CQ - dosed in mg/24 hours) patch 21 mg  21 mg Transdermal Daily Sundil, Subrina, MD   21 mg at 05/26/23 0823   rosuvastatin (CRESTOR) tablet 40 mg  40 mg Oral Daily Crosley, Debby, MD   40 mg at 05/26/23 8413   sodium chloride flush (NS) 0.9 % injection 10-40 mL  10-40 mL Intracatheter Q12H [place], Alvira Philips, DO   10 mL at 05/25/23 2245   sodium chloride flush (NS) 0.9 % injection 10-40 mL  10-40 mL Intracatheter PRN [place], Alvira Philips, DO       tamsulosin (FLOMAX) capsule 0.4 mg  0.4 mg Oral Daily Crosley, Debby, MD   0.4 mg at 05/26/23 2440   thiamine (VITAMIN B1) 250 mg in sodium chloride 0.9 % 50 mL IVPB  250 mg Intravenous Daily Erick Blinks, MD 100 mL/hr at 05/26/23 0833 250 mg at 05/26/23 1027   Followed by   Melene Muller ON 06/01/2023] thiamine (VITAMIN B1) injection 100 mg  100 mg Intravenous Daily Erick Blinks, MD         Discharge Medications: Please see discharge summary for a list of discharge medications.  Relevant Imaging Results:  Relevant Lab Results:   Additional Information SS#: [id number]  [name], LCSW

## 2023-05-26 NOTE — Progress Notes (Signed)
LTM EEG discontinued - no skin breakdown at unhook. [facility] 

## 2023-05-26 NOTE — Progress Notes (Deleted)
Neurology progress note  S: Patient is more oriented and less agitated this morning.  He is able to state his name and place and can follow simple commands.  Right-sided weakness persists.  EEG continues to demonstrate spikes in the left posterior quadrant but no overt seizures.  O:  Vitals:   05/25/23 2022 05/26/23 0350  BP:  (!) 148/76  Pulse: (!) 105 (!) 101  Resp: 18 18  Temp:  97.9 F (36.6 C)  SpO2: 98% 98%   General: Laying comfortably in bed; easily gets agitated. HENT: Normal oropharynx and mucosa. Normal external appearance of ears and nose.  Neck: Supple, no pain or tenderness  CV: No JVD. No peripheral edema.  Pulmonary: Symmetric Chest rise. Normal respiratory effort.  Abdomen: Soft to touch, non-tender.  Ext: No cyanosis, edema, or deformity  Skin: No rash. Normal palpation of skin.   Musculoskeletal: Normal digits and nails by inspection. No clubbing.    Neurologic Examination  Mental status/Cognition: drowsy, oriented to self and place but not time or situation. Poor attention. Speech/language: dysarhtic, speech is in simple words or very short phrases.  Able to follow single step and two-step commands Cranial nerves:   CN II Pupils equal and reactive to light,    CN III,IV,VI extraocular movements intact   CN V Facial sensation symmetrical   CN VII Face is symmetric smiling and resting   CN VIII Hearing is intact to voice   CN IX & X Voice is slightly dysarthric   CN XI Head midline with shoulder shrug stronger on the left than right   CN XII midline tongue protrusion    Motor:  Muscle bulk: poor, tone normal. 5/5 strength to left upper and lower extremities, 3/5 strength to right upper and lower extremities   Sensation:  Light touch Grossly intact BL   Pin prick     Temperature     Vibration    Proprioception      Coordination/Complex Motor:  - Finger to Nose intact on the left, would not try FTN with RUE  - Gait: deferred for patient  safety.  Data  Overnight EEG 8/14-8/15/24: - Spikes, left posterior quadrant - Continuous slow, left hemisphere - Background asymmetry, left<right  Overnight EEG 8/15 to 8/16: Spikes in the left posterior quadrant, continuous slowing left hemisphere, evidence of epileptogenicity arising from left posterior quadrant with cortical dysfunction arising from left hemisphere and no seizures   Unable to review images from OSH. Read radiology reports with no acute abnormalities noted on CTH, CTA, MRI Brains. Noted remote L frontoparietal encephalomalacia.  A/P:  Brian Dudley is a 62 y.o. male with PMH significant for EtOh use, COPD, prior L frontoparietal stroke, HTN, who was transferred from outside hospital for concern for focal L hemispheric seizures after L carotid endarterectomy. He was initially at his baseline post op, then noted to have worsening R sided weakness, aphasia and confusion with intermittent R hand jerking. Imaging at OSH with MRIs and routine EEGs were non revealing per transferring MD but the images are not available for our review. He was started on Keppra 1000mg  BID and transferred for [facility] neurology evaluation and cEEG. LTM x36 hrs since transfer has showed spikes in the left posterior quadrant but no seizures.  Plan -Continue LTM EEG for 1 more day given continued evidence of epileptogenicity -MRI brain with and without contrast once EEG discontinued  Patient seen by NP and then by MD, MD to edit note is needed Cortney E Tommi Rumps  Saintclair Halsted , MSN, AGACNP-BC [facility] See Amion for schedule and pager information 05/26/2023 8:06 AM

## 2023-05-26 NOTE — Progress Notes (Signed)
Neurology progress note  S: No subjective complaints. No clinical seizure activity.   O:  Vitals:   05/26/23 0809 05/26/23 1149  BP: 135/76 136/76  Pulse: (!) 104 96  Resp: 18   Temp: 98.6 F (37 C) 98 F (36.7 C)  SpO2: 99% 100%   General: Laying comfortably in bed; easily gets agitated. HENT: Normal oropharynx and mucosa. Normal external appearance of ears and nose.  Neck: Supple, no pain or tenderness  CV: No JVD. No peripheral edema.  Pulmonary: Symmetric Chest rise. Normal respiratory effort.  Abdomen: Soft to touch, non-tender.  Ext: No cyanosis, edema, or deformity  Skin: No rash. Normal palpation of skin.   Musculoskeletal: Normal digits and nails by inspection. No clubbing.    Neurologic Examination  Mental status/Cognition: drosy, oriented to self only. Poor attention. Speech/language: dysarhtic, non fluent, comprehension intact intermittently to simple commands, does not name objects. Cranial nerves:   CN II Pupils equal and reactive to light, blinks to threat BL   CN III,IV,VI makes eye contact on left and right. No nystagmus.   CN V Corneals intact BL   CN VII Symmetric facial smile and grimace.   CN VIII Makes eye contact to speech. Althou concerns that he maybe hard of hearing.   CN IX & X Seems to be protecting his airway   CN XI Head midline.   CN XII midline tongue protrusion    Motor:  Muscle bulk: poor, tone normal. Unable to do detailed strength testing secondary to encephalopathy and concern for aphasia. Moves LUE and LLE spontaneously. Will move all extremities to command. RUE with some antigravity movement but LUE is much more robust.   Sensation:  Light touch Grossly intact BL   Pin prick     Temperature     Vibration    Proprioception      Coordination/Complex Motor:  - Finger to Nose intact on the left, would not try FTN with RUE despite repeatedly asking him. - Heel to shin unable to assess due to confusion. - Rapid alternating movement  are slowed throughout - Gait: deferred for patient safety.  Data  Overnight EEG 8/14-8/16/24: - Spikes, left posterior quadrant - Continuous slow, left hemisphere - Background asymmetry, left<right   Unable to review images from OSH. Read radiology reports with no acute abnormalities noted on CTH, CTA, MRI Brains. Noted remote L frontoparietal encephalomalacia.  A/P:  Brian Dudley is a 62 y.o. male with PMH significant for EtOh use, COPD, prior L frontoparietal stroke, HTN, who was transferred from outside hospital for concern for focal L hemispheric seizures after L carotid endarterectomy. He was initially at his baseline post op, then noted to have worsening R sided weakness, aphasia and confusion with intermittent R hand jerking. Imaging at OSH with MRIs and routine EEGs were non revealing per transferring MD but the images are not available for our review. He was started on Keppra 1000mg  BID and transferred for [facility] neurology evaluation and cEEG. LTM x48 hrs since transfer has showed spikes in the left posterior quadrant but no seizures.  Plan - D/c LTM - MRI brain wwo - Will continue to follow  Brian Neighbors, MD [facility] [phone]  If 7pm- 7am, please page neurology on call as listed in AMION.

## 2023-05-26 NOTE — TOC Initial Note (Signed)
Transition of Care [facility]) - Initial/Assessment Note    Patient Details  Name: Brian Dudley MRN: [id number] Date of Birth: September 20, 1961  [facility] of [facility]) CM/SW Contact:    [name], LCSW Phone Number: 05/26/2023, 11:26 AM  Clinical Narrative:        CSW spoke with patient's daughter, Brian Dudley, to discuss recommendation for SNF. Patient originally from [place], [place], but daughter lives in [place]. Daughter works as a Engineer, civil (consulting), very familiar with SNF in the area but unsure where she would want to try to get the patient into. CSW to fax out referral, will follow up with bed offers.           Expected Discharge Plan: Skilled Nursing Facility Barriers to Discharge: Continued Medical Work up, English as a second language teacher   Patient Goals and CMS Choice Patient states their goals for this hospitalization and ongoing recovery are:: patient unable to participate in goal setting, not oriented CMS Medicare.gov Compare Post Acute Care list provided to:: Patient Represenative (must comment) Choice offered to / list presented to : Adult Children [place] ownership interest in [facility].provided to:: Adult Children    Expected Discharge Plan and Services     Post Acute Care Choice: Skilled Nursing Facility Living arrangements for the past 2 months: Single Family Home                                      Prior Living Arrangements/Services Living arrangements for the past 2 months: Single Family Home Lives with:: Self Patient language and need for interpreter reviewed:: No Do you feel safe going back to the place where you live?: Yes      Need for Family Participation in Patient Care: Yes (Comment) Care giver support system in place?: No (comment)   Criminal Activity/Legal Involvement Pertinent to Current Situation/Hospitalization: No - Comment as needed  Activities of Daily Living Home Assistive Devices/Equipment: None ADL Screening (condition at time  of admission) Patient's cognitive ability adequate to safely complete daily activities?: No Is the patient deaf or have difficulty hearing?: No Does the patient have difficulty seeing, even when wearing glasses/contacts?: Yes Does the patient have difficulty concentrating, remembering, or making decisions?: Yes Patient able to express need for assistance with ADLs?: Yes Does the patient have difficulty dressing or bathing?: Yes Independently performs ADLs?: Yes (appropriate for developmental age) Does the patient have difficulty walking or climbing stairs?: Yes Weakness of Legs: None Weakness of Arms/Hands: Right  Permission Sought/Granted Permission sought to share information with : Facility Medical sales representative, Family Supports Permission granted to share information with : Yes, Verbal Permission Granted  Share Information with NAME: [name]  Permission granted to share info w AGENCY: SNF  Permission granted to share info w Relationship: Daughter     Emotional Assessment   Attitude/Demeanor/Rapport: Unable to Assess Affect (typically observed): Unable to Assess   Alcohol / Substance Use: Not Applicable Psych Involvement: No (comment)  Admission diagnosis:  Altered mental status [R41.82] Patient Active Problem List   Diagnosis Date Noted   COPD (chronic obstructive pulmonary disease) (HCC) 05/24/2023   Tobacco abuse 05/24/2023   HLD (hyperlipidemia) 05/24/2023   BPH (benign prostatic hyperplasia) 05/24/2023   Prostate cancer (HCC) 05/24/2023   History of malignant neoplasm of rectosigmoid junction 05/24/2023   Altered mental status 05/24/2023   Colonic diverticular abscess    Perforation of sigmoid colon due to diverticulitis 03/24/2018   PCP:  Patient, No Pcp Per Pharmacy:   [id number] [place] - [address] AT [facility] [place] [address] [facility] [place] [address] Phone: [phone] Fax: [phone]  [facility]  Pharm[facility] - [address] ([place]), [place] - [address] [place] [address] Phone: [phone] Fax: [phone]     Social Determinants of Health (SDOH) Social History: SDOH Screenings   Food Insecurity: Patient Unable To Answer (05/24/2023)  Housing: Patient Unable To Answer (05/24/2023)  Transportation Needs: No Transportation Needs (09/09/2019)   Received from [facility]  Utilities: Patient Unable To Answer (05/24/2023)  Financial Resource Strain: Low Risk  (09/09/2019)   Received from [facility]  Physical Activity: Inactive (11/07/2018)   Received from [facility]  Social Connections: Unknown (12/06/2022)   Received from [facility]  Stress: Stress Concern Present (11/07/2018)   Received from [facility]  Tobacco Use: High Risk (05/24/2023)   SDOH Interventions:     Readmission Risk Interventions     No data to display

## 2023-05-26 NOTE — Procedures (Addendum)
Patient Name: Brian Dudley  MRN: [id number]  Epilepsy Attending: Charlsie Quest  Referring Physician/Provider: Erick Blinks, MD  Duration: 05/26/2023 0730 to 05/26/2023 1117   Patient history: 62 y.o. male with PMH significant for EtOh use, COPD, prior L frontoparietal stroke, HTN, who was transferred from outside hospital for concern for focal L hemispheric seizures after L carotid endarterectomy. EEG to evaluate for seizure.   Level of alertness: Awake, asleep   AEDs during EEG study: LEV   Technical aspects: This EEG study was done with scalp electrodes positioned according to the 10-20 International system of electrode placement. Electrical activity was reviewed with band pass filter of 1-70Hz , sensitivity of 7 uV/mm, display speed of 39mm/sec with a 60Hz  notched filter applied as appropriate. EEG data were recorded continuously and digitally stored.  Video monitoring was available and reviewed as appropriate.   Description: The posterior dominant rhythm consists of 8 Hz activity of moderate voltage (25-35 uV) seen predominantly in posterior head regions, asymmetric ( left<right) and reactive to eye opening and eye closing. Sleep was characterized by sleep spindles (12 to 15 Hz), maximal frontocentral region. EEG showed continuous 3 to 5 Hz theta-delta slowing in left hemisphere. Frequent spikes were noted in left posterior quadrant.   ABNORMALITY - Spikes, left posterior quadrant - Continuous slow, left hemisphere - Background asymmetry, left<right   IMPRESSION: This study showed evidence of epileptogenicity arising from left posterior quadrant.  Additionally there was cortical dysfunction arising from left hemisphere likely secondary to underlying stroke.  No seizures were seen during this time.   [name] [name]

## 2023-05-27 ENCOUNTER — Inpatient Hospital Stay (HOSPITAL_COMMUNITY): Payer: 59

## 2023-05-27 DIAGNOSIS — Z8673 Personal history of transient ischemic attack (TIA), and cerebral infarction without residual deficits: Secondary | ICD-10-CM | POA: Diagnosis not present

## 2023-05-27 DIAGNOSIS — R4182 Altered mental status, unspecified: Secondary | ICD-10-CM | POA: Diagnosis not present

## 2023-05-27 DIAGNOSIS — R569 Unspecified convulsions: Secondary | ICD-10-CM | POA: Diagnosis not present

## 2023-05-27 DIAGNOSIS — R9401 Abnormal electroencephalogram [EEG]: Secondary | ICD-10-CM | POA: Diagnosis not present

## 2023-05-27 LAB — GLUCOSE, CAPILLARY
Glucose-Capillary: 142 mg/dL — ABNORMAL HIGH (ref 70–99)
Glucose-Capillary: 148 mg/dL — ABNORMAL HIGH (ref 70–99)
Glucose-Capillary: 174 mg/dL — ABNORMAL HIGH (ref 70–99)
Glucose-Capillary: 201 mg/dL — ABNORMAL HIGH (ref 70–99)

## 2023-05-27 MED ORDER — LORAZEPAM 2 MG/ML IJ SOLN
1.0000 mg | Freq: Once | INTRAMUSCULAR | Status: AC | PRN
  Administered 2023-05-27: 1 mg via INTRAVENOUS
  Filled 2023-05-27: qty 1

## 2023-05-27 MED ORDER — GADOBUTROL 1 MMOL/ML IV SOLN
6.0000 mL | Freq: Once | INTRAVENOUS | Status: AC | PRN
  Administered 2023-05-27: 6 mL via INTRAVENOUS

## 2023-05-27 NOTE — Plan of Care (Signed)

## 2023-05-27 NOTE — Evaluation (Signed)
Clinical/Bedside Swallow Evaluation Patient Details  Name: Brian Dudley MRN: [id number] Date of Birth: 1961-07-30  Today's Date: 05/27/2023 Time: SLP Start Time (ACUTE ONLY): 1035 SLP Stop Time (ACUTE ONLY): 1054 SLP Time Calculation (min) (ACUTE ONLY): 19 min  Past Medical History:  Past Medical History:  Diagnosis Date   BPH (benign prostatic hyperplasia)    COPD with emphysema (HCC)    CVA (cerebral vascular accident) (HCC)    Diverticulitis    History of alcohol abuse    History of colon cancer    History of tobacco abuse    Hypertension    Prostate cancer [facility])    Past Surgical History:  Past Surgical History:  Procedure Laterality Date   CAROTID ENDARTERECTOMY Left 05/11/2023   TONSILLECTOMY     HPI:  Brian Dudley is a 62 y.o. male with PMH significant for EtOh use, COPD, prior L frontoparietal stroke, HTN, who was transferred from outside hospital for concern for focal L hemispheric seizures after L carotid endarterectomy. He was initially at his baseline post op, then noted to have worsening R sided weakness, aphasia and confusion with intermittent R hand jerking. Imaging at OSH with MRIs and routine EEGs were non revealing per transferring MD but the images are not available for our review. He was started on Keppra 1000mg  BID and transferred for [facility] neurology evaluation and cEEG. LTM x48 hrs since transfer has showed spikes in the left posterior quadrant but no seizures.    Assessment / Plan / Recommendation  Clinical Impression  Patient presents with suspected oropharyngeal phase dysphagia characterized by cough post swallow with thin liquids suggestive of decreased airway protection. Decrease in s/s of aspiration observed with nectar thick liquids and solids. Note that patient does have a baseline congested cough and intermittent baseline wet vocal quality which he appears unaware of but is able to clear with cued throat clear/cough. Discussed with daughter via  phone. She states that he does not have a  known h/o dysphagia nor has he been on a dysphagia diet prior to this admission. Congestion was noted to be baseline due to COPD however per current CXR which notes possible right base opacities, cannot r/o aspiration, particularly given h/o CVA and acute confusion. Plan to keep on a po diet and complete MBS as soon at 8/18, possibly 8/19 pending radiology availability. SLP Visit Diagnosis: Dysphagia, oropharyngeal phase (R13.12)       Diet Recommendation Dysphagia 3 (Mech soft);Nectar-thick liquid    Liquid Administration via: Cup Medication Administration: Whole meds with puree Compensations: Slow rate;Small sips/bites;Clear throat intermittently Postural Changes: Seated upright at 90 degrees    Other  Recommendations Oral Care Recommendations: Oral care BID Caregiver Recommendations: Avoid jello, ice cream, thin soups, popsicles;Remove water pitcher    Recommendations for follow up therapy are one component of a multi-disciplinary discharge planning process, led by the attending physician.  Recommendations may be updated based on patient status, additional functional criteria and insurance authorization.    Swallow Study   General HPI: Brian Dudley is a 62 y.o. male with PMH significant for EtOh use, COPD, prior L frontoparietal stroke, HTN, who was transferred from outside hospital for concern for focal L hemispheric seizures after L carotid endarterectomy. He was initially at his baseline post op, then noted to have worsening R sided weakness, aphasia and confusion with intermittent R hand jerking. Imaging at OSH with MRIs and routine EEGs were non revealing per transferring MD but the images are not available for our review. He  was started on Keppra 1000mg  BID and transferred for [facility] neurology evaluation and cEEG. LTM x48 hrs since transfer has showed spikes in the left posterior quadrant but no seizures. Type of Study: Bedside Swallow  Evaluation Previous Swallow Assessment: none noted in chart Diet Prior to this Study: Dysphagia 1 (pureed);Mildly thick liquids (Level 2, nectar thick) Temperature Spikes Noted: No Respiratory Status: Room air History of Recent Intubation: No Behavior/Cognition: Alert;Cooperative;Pleasant mood;Confused Oral Cavity Assessment: Within Functional Limits Oral Care Completed by SLP: Recent completion by staff Oral Cavity - Dentition: Adequate natural dentition Vision: Functional for self-feeding Self-Feeding Abilities: Able to feed self Patient Positioning: Upright in bed Baseline Vocal Quality: Wet;Hoarse Volitional Cough: Strong;Congested Volitional Swallow: Able to elicit    Oral/Motor/Sensory Function Overall Oral Motor/Sensory Function: Mild impairment Facial ROM: Within Functional Limits Facial Symmetry: Within Functional Limits Facial Strength: Within Functional Limits Facial Sensation: Within Functional Limits Lingual ROM: Within Functional Limits (decreased coordination) Lingual Symmetry: Within Functional Limits Lingual Strength: Within Functional Limits Velum: Within Functional Limits Mandible: Within Functional Limits   Ice Chips Ice chips: Within functional limits Presentation: Spoon   Thin Liquid Thin Liquid: Impaired Presentation: Cup;Self Fed;Straw Pharyngeal  Phase Impairments: Wet Vocal Quality;Cough - Immediate    Nectar Thick Nectar Thick Liquid: Within functional limits Presentation: Cup;Self Fed   Honey Thick Honey Thick Liquid: Not tested   Puree Puree: Within functional limits Presentation: Self Fed;Spoon   Solid     Solid: Within functional limits Presentation: Self Fed     Brian Anthis MA, CCC-SLP  Brian Dudley Brian Dudley 05/27/2023,11:00 AM

## 2023-05-27 NOTE — Progress Notes (Addendum)
PROGRESS NOTE    Brian Dudley  MVH:[id number] DOB: June 20, 1961 DOA: 05/23/2023 PCP: Patient, No Pcp Per    Brief Narrative:   Brian Dudley is a 62 y.o. male with past medical history significant for COPD, CVA, HTN, BPH, stage IV prostate cancer, history of stage III rectosigmoid cancer, recent CVA approximately 8 weeks ago with recent left carotid endarterectomy who presented to [facility] as a transfer from [facility] hospital in [place] with concern of seizures.  Following his endarterectomy, patient was doing well postoperatively but 2 days later he developed altered mentation.  He [name] presented to [facility] with MRI brain, CTA head/neck with no acute findings.  Teleneurology was consulted given concern patient was having focal hemispheric seizures as EEG was showing left-sided slowing but no LF deform discharges.  He was noted to have some episodes of right arm shaking was started on Keppra 1000 mg twice daily for seizures.  Patient had repeat MRI and EEG that was unrevealing; although patient was confused and persisted.  [facility] vascular surgeon discussed with neurology who recommended transfer to [facility] as their facility does not have an in-house neurologist or continuous EEG available.  Patient was unable to give any further history due to his confusion.  Assessment & Plan:    Seizure Patient presented to [facility] with confusion, aphasia and intermittent right hand jerking.  Recent CVA and underwent a left carotid endarterectomy and symptoms onset 2 days following procedure.  Imaging at OSH with MRI and routine EEG were unrevealing.  Was started on Keppra 1000 mg BID and transferred to [facility] for in person neurology evaluation with continuous EEG. EEG with spikes left posterior quadrant, continuous slow left hemisphere; no seizure or left form discharges noted. -- Neurology following,  appreciate assistance -- Keppra 1000 mg BID -- MRI brain with and without contrast: Pending -- Dysphagia 3 diet, SLP following -- Further per neurology  Recent CVA Hx carotid artery stenosis s/p left carotid endarterectomy -- Plavix 75 mg p.o. daily -- Aspirin 81 mg p.o. daily -- Crestor 40 mg p.o. daily  Essential hypertension -- Metoprolol to tartrate 12.5 mg p.o. twice daily -- Continue aspirin and statin  Hyperlipidemia -- Crestor 40 mg p.o. daily  BPH -- Continue tamsulosin 0.4 mg p.o. daily -- Foley catheter removed 8/15, previously placed at OSH -- Closely monitor urine output  Prediabetes Hemoglobin A1c 6.3.  Outpatient follow-up with PCP.  COPD Not oxygen dependent. -- Dulera 2 puffs twice daily (hospital substitution for Trelegy Ellipta)  History of prostate/rectosigmoid carcinoma -- Continue outpatient follow-up with [facility] oncology  Weakness/debility/deconditioning: -- PT/OT recommending SNF -- TOC consulted for placement  DVT prophylaxis: heparin injection 5,000 Units Start: 05/24/23 1400    Code Status: Full Code Family Communication: No family present at bedside  Disposition Plan:  Level of care: Telemetry Medical Status is: Inpatient Remains inpatient appropriate because:  will need SNF placement    Consultants:  Neurology  Procedures:  Continuous EEG  Antimicrobials:  None   Subjective: Patient seen examined bedside, resting calmly.  Lying in bed.  Remains confused but alert.  EEG discontinued yesterday.  Pending MRI brain with and without contrast today.  Alertness and orientation continue to slowly improve daily.  Patient knows that he is in the hospital in [place] but believes President Joni Fears is the president and the year is 19XX.  No family present at bedside.  No other specific questions or concerns at  this time.   No acute concerns overnight per nursing staff.    Objective: Vitals:   05/27/23 0604 05/27/23 0805 05/27/23 0820  05/27/23 0846  BP: 121/80 (!) 159/98    Pulse: 99 (!) 113 (!) 104   Resp: 16 14    Temp: 98 F (36.7 C) 99.2 F (37.3 C)    TempSrc: Oral Oral    SpO2: 100% 99%  97%  Height:        Intake/Output Summary (Last 24 hours) at 05/27/2023 1033 Last data filed at 05/27/2023 0900 Gross per 24 hour  Intake 3671.9 ml  Output 2500 ml  Net 1171.9 ml   There were no vitals filed for this visit.  Examination:  Physical Exam: GEN: NAD, alert, oriented self and place ([facility]ospital/[place]) but note time (19XX)/situation, person (president: [place]), poor dentition, chronically ill in appearance HEENT: NCAT, PERRL, EOMI, sclera clear, MMM PULM: CTAB w/o wheezes/crackles, normal respiratory effort, on room air CV: RRR w/o M/G/R GI: abd soft, NTND, + BS MSK: no peripheral edema, moves all extremities independently NEURO: CN II-XII intact, no focal deficits other than his persistent confusion PSYCH: normal mood/affect Integumentary: dry/intact, no rashes or wounds    Data Reviewed: I have personally reviewed following labs and imaging studies  CBC: Recent Labs  Lab 05/24/23 0251 05/26/23 0357  WBC 9.9 7.2  NEUTROABS 7.1  --   HGB 12.6* 11.6*  HCT 39.4 36.3*  MCV 87.9 88.5  PLT 486* 431*   Basic Metabolic Panel: Recent Labs  Lab 05/24/23 0251 05/26/23 0357  NA 134* 138  K 3.7 4.2  CL 98 105  CO2 25 26  GLUCOSE 108* 110*  BUN <5* <5*  CREATININE 0.62 0.56*  CALCIUM 9.2 9.1   GFR: CrCl cannot be calculated (Unknown ideal weight.). Liver Function Tests: Recent Labs  Lab 05/24/23 0251  AST 18  ALT 12  ALKPHOS 151*  BILITOT 0.5  PROT 6.9  ALBUMIN 2.7*   No results for input(s): "LIPASE", "AMYLASE" in the last 168 hours. Recent Labs  Lab 05/24/23 0251  AMMONIA <10   Coagulation Profile: No results for input(s): "INR", "PROTIME" in the last 168 hours. Cardiac Enzymes: No results for input(s): "CKTOTAL", "CKMB", "CKMBINDEX", "TROPONINI" in the last 168 hours. BNP  (last 3 results) No results for input(s): "PROBNP" in the last 8760 hours. HbA1C: No results for input(s): "HGBA1C" in the last 72 hours.  CBG: Recent Labs  Lab 05/26/23 0623 05/26/23 1144 05/26/23 1824 05/27/23 0002 05/27/23 0557  GLUCAP 129* 133* 143* 201* 142*   Lipid Profile: No results for input(s): "CHOL", "HDL", "LDLCALC", "TRIG", "CHOLHDL", "LDLDIRECT" in the last 72 hours. Thyroid Function Tests: No results for input(s): "TSH", "T4TOTAL", "FREET4", "T3FREE", "THYROIDAB" in the last 72 hours.  Anemia Panel: No results for input(s): "VITAMINB12", "FOLATE", "FERRITIN", "TIBC", "IRON", "RETICCTPCT" in the last 72 hours.  Sepsis Labs: No results for input(s): "PROCALCITON", "LATICACIDVEN" in the last 168 hours.  Recent Results (from the past 240 hour(s))  Culture, blood (Routine X 2) w Reflex to ID Panel     Status: None (Preliminary result)   Collection Time: 05/24/23  2:49 AM   Specimen: BLOOD RIGHT HAND  Result Value Ref Range Status   Specimen Description BLOOD RIGHT HAND  Final   Special Requests   Final    BOTTLES DRAWN AEROBIC AND ANAEROBIC Blood Culture results may not be optimal due to an inadequate volume of blood received in culture bottles   Culture   Final  NO GROWTH 3 DAYS Performed at [facility], [address] [place], [place] [address]    Report Status PENDING  Incomplete  Culture, blood (Routine X 2) w Reflex to ID Panel     Status: None (Preliminary result)   Collection Time: 05/24/23  2:51 AM   Specimen: BLOOD  Result Value Ref Range Status   Specimen Description BLOOD RIGHT ANTECUBITAL  Final   Special Requests   Final    BOTTLES DRAWN AEROBIC AND ANAEROBIC Blood Culture results may not be optimal due to an inadequate volume of blood received in culture bottles   Culture   Final    NO GROWTH 3 DAYS Performed at [facility], [address] [place], [place] [address]    Report Status PENDING  Incomplete          Radiology Studies: No results found.      Scheduled Meds:  amLODipine  5 mg Oral Daily   aspirin EC  81 mg Oral Daily   clopidogrel  75 mg Oral Daily   heparin  5,000 Units Subcutaneous Q8H   insulin aspart  0-9 Units Subcutaneous Q6H   levETIRAcetam  1,000 mg Oral BID   metoprolol tartrate  12.5 mg Oral BID   mometasone-formoterol  2 puff Inhalation BID   nicotine  21 mg Transdermal Daily   rosuvastatin  40 mg Oral Daily   sodium chloride flush  10-40 mL Intracatheter Q12H   tamsulosin  0.4 mg Oral Daily   [START ON 06/01/2023] thiamine (VITAMIN B1) injection  100 mg Intravenous Daily   Continuous Infusions:  dextrose 5% lactated ringers 100 mL/hr at 05/27/23 0947   thiamine (VITAMIN B1) injection 250 mg (05/27/23 1005)     LOS: 4 days    Time spent: 48 minutes spent on chart review, discussion with nursing staff, consultants, updating family and interview/physical exam; more than 50% of that time was spent in counseling and/or coordination of care.    Alvira Philips [place], DO [facility] Available via Epic secure chat 7am-7pm After these hours, please refer to coverage provider listed on [email or web address] 05/27/2023, 10:33 AM

## 2023-05-27 NOTE — Progress Notes (Signed)
Neurology progress note  S: Patient has no acute complaints today.  He is more alert and able to attend to conversation although still disoriented to place time and situation  O:  Vitals:   05/27/23 0820 05/27/23 0846  BP:    Pulse: (!) 104   Resp:    Temp:    SpO2:  97%   General: Laying comfortably in bed; calm and cooperative HENT: Normal oropharynx and mucosa. Normal external appearance of ears and nose.  Neck: Supple, no pain or tenderness  CV: No JVD. No peripheral edema.  Pulmonary: Symmetric Chest rise. Normal respiratory effort.  Abdomen: Soft to touch, non-tender.  Ext: No cyanosis, edema, or deformity  Skin: No rash. Normal palpation of skin.   Musculoskeletal: Normal digits and nails by inspection. No clubbing.    Neurologic Examination  Mental status/Cognition: Alert, oriented to person but disoriented to place time and situation, able to attend to conversation Speech/language: Speech is clear and in full sentences, able to follow simple and complex commands Cranial nerves:   CN II Pupils equal and reactive to light,    CN III,IV,VI Extraocular movements intact no nystagmus.   CN V Facial sensation symmetrical   CN VII Face symmetric resting and smiling   CN VIII Hearing intact to voice   CN IX & X Phonation normal   CN XI Head midline.   CN XII midline tongue protrusion    Motor:  Muscle bulk: poor, tone normal. 5/5 strength in left upper and lower extremities, 3/5 strength in right upper extremity and 4+/5 strength in right lower extremity   Sensation:  Light touch Grossly intact BL   Pin prick     Temperature     Vibration    Proprioception      Coordination/Complex Motor:  - Finger to Nose intact on the left, unable to perform on the right due to weakness - Gait: deferred for patient safety.  Data  Overnight EEG 8/14-8/16/24: - Spikes, left posterior quadrant - Continuous slow, left hemisphere - Background asymmetry, left<right   Unable to  review images from OSH. Read radiology reports with no acute abnormalities noted on CTH, CTA, MRI Brains. Noted remote L frontoparietal encephalomalacia.  TSH 0.697 Folate 6.0 Ammonia less than 10 A/P:  Brian Dudley is a 62 y.o. male with PMH significant for EtOh use, COPD, prior L frontoparietal stroke, HTN, who was transferred from outside hospital for concern for focal L hemispheric seizures after L carotid endarterectomy. He was initially at his baseline post op, then noted to have worsening R sided weakness, aphasia and confusion with intermittent R hand jerking. Imaging at OSH with MRIs and routine EEGs were non revealing per transferring MD but the images are not available for our review. He was started on Keppra 1000mg  BID and transferred for [facility] neurology evaluation and cEEG. LTM x48 hrs since transfer has showed spikes in the left posterior quadrant but no seizures.  Patient appears improved on exam today, more alert and able to attend to conversation and speak in full sentences.  He is still disoriented, but hopeful that he will continue to improve.  Will await MRI results today.  Plan - MRI brain wwo -Will order AMS labs, RPR, B12, B1 - Will continue to follow  Patient seen by NP and then by MD, MD to edit note as needed Cortney E Ernestina Columbia , MSN, AGACNP-BC [facility] See Amion for schedule and pager information 05/27/2023 9:42 AM   Attending Neurohospitalist  Addendum Patient seen and examined with APP/Resident. Agree with the history and physical as documented above. Agree with the plan as documented, which I helped formulate. I have edited the note above to reflect my full findings and recommendations. I have independently reviewed the chart, obtained history, review of systems and examined the patient.I have personally reviewed pertinent head/neck/spine imaging (CT/MRI). Please feel free to call with any questions.  -- Bing Neighbors, MD [facility] [phone]  If 7pm- 7am, please page neurology on call as listed in AMION.

## 2023-05-28 ENCOUNTER — Inpatient Hospital Stay (HOSPITAL_COMMUNITY): Payer: 59

## 2023-05-28 DIAGNOSIS — R569 Unspecified convulsions: Secondary | ICD-10-CM | POA: Diagnosis not present

## 2023-05-28 LAB — GLUCOSE, CAPILLARY
Glucose-Capillary: 127 mg/dL — ABNORMAL HIGH (ref 70–99)
Glucose-Capillary: 128 mg/dL — ABNORMAL HIGH (ref 70–99)
Glucose-Capillary: 132 mg/dL — ABNORMAL HIGH (ref 70–99)
Glucose-Capillary: 138 mg/dL — ABNORMAL HIGH (ref 70–99)

## 2023-05-28 LAB — VITAMIN B12: Vitamin B-12: 658 pg/mL (ref 180–914)

## 2023-05-28 LAB — TROPONIN I (HIGH SENSITIVITY): Troponin I (High Sensitivity): 4 ng/L (ref <18)

## 2023-05-28 MED ORDER — METOPROLOL TARTRATE 5 MG/5ML IV SOLN
2.5000 mg | INTRAVENOUS | Status: DC | PRN
  Administered 2023-05-28: 2.5 mg via INTRAVENOUS
  Filled 2023-05-28: qty 5

## 2023-05-28 MED ORDER — METOPROLOL TARTRATE 5 MG/5ML IV SOLN: 2.5000 mg | INTRAVENOUS | Status: DC | PRN

## 2023-05-28 MED ORDER — METOPROLOL TARTRATE 5 MG/5ML IV SOLN: 5.0000 mg | INTRAVENOUS | Status: DC

## 2023-05-28 NOTE — Progress Notes (Addendum)
Bedside nurse reported that patient heart rate is 133 otherwise hemodynamically stable blood pressure 131/83 and O2 sat 98% room air.Marland Kitchen  He is due for his nighttime Lopressor 12.5 mg. Chart review patient has history of hypertension currently on Lopressor 12.5 mg twice daily. -Recommended to give the night dose of Lopressor 12.5 mg and get an EKG. -Ordered as needed Lopressor 2.5 mg to give every 5 minutes as needed x 3 doses if patient persistently continues to have heart rate above 140. -Checking troponin x 2. Addendum: -EKG shows sinus tachycardia heart rate 120.  Normal troponin level.  Patient is chest pain-free.  ACS ruled out.  Tereasa Coop, MD [facility] 05/28/2023, 7:48 PM

## 2023-05-28 NOTE — Progress Notes (Signed)
Modified Barium Swallow Study  Patient Details  Name: Brian Dudley MRN: [id number] Date of Birth: [birth year]  Today's Date: 05/28/2023  Modified Barium Swallow completed.  Full report located under Chart Review in the Imaging Section.  History of Present Illness Brian Dudley is a 62 y.o. male with PMH significant for EtOh use, COPD, prior L frontoparietal stroke, HTN, who was transferred from outside hospital for concern for focal L hemispheric seizures after L carotid endarterectomy. He was initially at his baseline post op, then noted to have worsening R sided weakness, aphasia and confusion with intermittent R hand jerking. Imaging at OSH with MRIs and routine EEGs were non revealing per transferring MD but the images are not available for our review. He was started on Keppra 1000mg  BID and transferred for [facility] neurology evaluation and cEEG. LTM x48 hrs since transfer has showed spikes in the left posterior quadrant but no seizures.   Clinical Impression Pt demonstrates a mild oral dysphagia; suspect a sensory oral deficit leading to decreased automaticy of oral trnasit, decreased sensation of pooled secretions on the right and oral residue, though quite mild. Dentition very poor though pt is able to masticate solids. Pharyngeal function relatively good with trace flash penetration of thin liquids at times and mild residue, though suspect this is due primarily to thick standing secretions. Pt has intermittent congested coughing that was unreleated to any aspiration. Recommend dys 2/thin liquids with f/u for tolerance. Unsure how significant any oral residue after a meal may become. Factors that may increase risk of adverse event in presence of aspiration Brian Dudley & Brian Dudley 2021): Reduced cognitive function  Swallow Evaluation Recommendations Liquid Administration via: Cup;Straw Medication Administration: Whole meds with liquid Supervision: Staff to assist with self-feeding Swallowing  strategies  : Slow rate;Small bites/sips Postural changes: Position pt fully upright for meals Oral care recommendations: Oral care before PO (oral care after meals) Caregiver Recommendations: Have oral suction available     Brian Ditty, MA CCC-SLP  Acute Rehabilitation Services Secure Chat Preferred Office [phone]  Brian Dudley 05/28/2023,12:16 PM

## 2023-05-28 NOTE — Progress Notes (Signed)
Neurology progress note  S: Patient has no acute complaints today.  He is able to attend to conversation and can say he is in the hospital when given choices.  As he was reportedly alert and oriented before admission to the hospital, he is definitely not back to baseline yet  O:  Vitals:   05/28/23 0749 05/28/23 0838  BP: 127/80   Pulse: (!) 104   Resp: 18   Temp: 98 F (36.7 C)   SpO2: 95% 97%   General: Laying comfortably in bed; calm and cooperative HENT: Normal oropharynx and mucosa. Normal external appearance of ears and nose.  Neck: Supple, no pain or tenderness  CV: No JVD. No peripheral edema.  Pulmonary: Symmetric Chest rise. Normal respiratory effort.  Abdomen: Soft to touch, non-tender.  Ext: No cyanosis, edema, or deformity  Skin: No rash. Normal palpation of skin.   Musculoskeletal: Normal digits and nails by inspection. No clubbing.    Neurologic Examination  Mental status/Cognition: Alert, oriented to person and oriented to place with choices.  Disoriented to time and situation Speech/language: Speech is clear and in full sentences, able to follow simple and complex commands Cranial nerves:   CN II Pupils equal and reactive to light,    CN III,IV,VI Extraocular movements intact no nystagmus.   CN V Facial sensation symmetrical   CN VII Face symmetric resting and smiling   CN VIII Hearing intact to voice   CN IX & X Phonation normal   CN XI Head midline.   CN XII midline tongue protrusion    Motor:  Muscle bulk: poor, tone normal. 5/5 strength in left upper and lower extremities, 3/5 strength in right upper extremity and 4+/5 strength in right lower extremity   Sensation:  Light touch Grossly intact BL   Pin prick     Temperature     Vibration    Proprioception      Coordination/Complex Motor:  - Finger to Nose intact on the left, unable to perform on the right due to weakness - Gait: deferred for patient safety.  Data  Overnight EEG  8/14-8/16/24: - Spikes, left posterior quadrant - Continuous slow, left hemisphere - Background asymmetry, left<right  MRI brain: No acute abnormality, encephalomalacia and gliosis in left cerebral hemisphere, 7 mm focus of enhancement within adjacent prominent vessel at the posterior left frontoparietal region, abnormal T1 hypointense enhancing lesion within right lateral mass of T1, atrophy and chronic small vessel ischemic disease   Unable to review images from OSH. Read radiology reports with no acute abnormalities noted on CTH, CTA, MRI Brains. Noted remote L frontoparietal encephalomalacia.  TSH 0.697 Folate 6.0 Ammonia less than 10 RPR pending Thiamine pending B12 pending  A/P:  Brian Dudley is a 62 y.o. male with PMH significant for EtOh use, COPD, prior L frontoparietal stroke, HTN, who was transferred from outside hospital for concern for focal L hemispheric seizures after L carotid endarterectomy. He was initially at his baseline post op, then noted to have worsening R sided weakness, aphasia and confusion with intermittent R hand jerking. Imaging at OSH with MRIs and routine EEGs were non revealing per transferring MD but the images are not available for our review. He was started on Keppra 1000mg  BID and transferred for [facility] neurology evaluation and cEEG. LTM x48 hrs since transfer has showed spikes in the left posterior quadrant but no seizures.  Patient appears improved on exam today, more alert and able to attend to conversation and speak in full  sentences.  He is still disoriented, but hopeful that he will continue to improve.  Brain MRI shows no acute abnormality to explain patient's altered mental status.  His mental status appears somewhat improved again today, and hopefully he will continue to improve back to baseline.  Plan -Follow-up on AMS labs - Will continue to follow  Patient seen by NP and then by MD, MD to edit note as needed Cortney E Ernestina Columbia , MSN,  AGACNP-BC [facility] See Amion for schedule and pager information 05/28/2023 9:02 AM  If 7pm- 7am, please page neurology on call as listed in AMION.   Attending Neurohospitalist Addendum Patient seen and examined with APP/Resident. Agree with the history and physical as documented above. Agree with the plan as documented, which I helped formulate. I have edited the note above to reflect my full findings and recommendations. I have independently reviewed the chart, obtained history, review of systems and examined the patient.I have personally reviewed pertinent head/neck/spine imaging (CT/MRI). Please feel free to call with any questions.  -- Bing Neighbors, MD [facility] [phone]  If 7pm- 7am, please page neurology on call as listed in AMION.

## 2023-05-28 NOTE — Progress Notes (Addendum)
PROGRESS NOTE    Brian Dudley  FAO:[id number] DOB: 11-Mar-1961 DOA: 05/23/2023 PCP: Patient, No Pcp Per    Brief Narrative:   Brian Dudley is a 62 y.o. male with past medical history significant for COPD, CVA, HTN, BPH, stage IV prostate cancer, history of stage III rectosigmoid cancer, recent CVA approximately 8 weeks ago with recent left carotid endarterectomy who presented to [facility] as a transfer from [facility] in [place] with concern of seizures.  Following his endarterectomy, patient was doing well postoperatively but 2 days later he developed altered mentation.  He [name] presented to [facility] with MRI brain, CTA head/neck with no acute findings.  Teleneurology was consulted given concern patient was having focal hemispheric seizures as EEG was showing left-sided slowing but no LF deform discharges.  He was noted to have some episodes of right arm shaking was started on Keppra 1000 mg twice daily for seizures.  Patient had repeat MRI and EEG that was unrevealing; although patient was confused and persisted.  [facility] vascular surgeon discussed with neurology who recommended transfer to [facility] as their facility does not have an in-house neurologist or continuous EEG available.  Patient was unable to give any further history due to his confusion.  Assessment & Plan:    Seizure Patient presented to [facility] with confusion, aphasia and intermittent right hand jerking.  Recent CVA and underwent a left carotid endarterectomy and symptoms onset 2 days following procedure.  Imaging at OSH with MRI and routine EEG were unrevealing.  Was started on Keppra 1000 mg BID and transferred to [facility] for in person neurology evaluation with continuous EEG. EEG with spikes left posterior quadrant, continuous slow left hemisphere; no seizure or left form discharges noted.  MR brain without contrast  with no acute intracranial abnormality, noted encephalopathy left cerebral hemisphere consistent with prior infarct. -- Neurology following, appreciate assistance -- Keppra 1000 mg BID -- MRI brain with and without contrast: Pending -- Dysphagia 2 diet, SLP following -- Further per neurology  Recent CVA Hx carotid artery stenosis s/p left carotid endarterectomy -- Plavix 75 mg p.o. daily -- Aspirin 81 mg p.o. daily -- Crestor 40 mg p.o. daily  Essential hypertension -- Metoprolol to tartrate 12.5 mg p.o. twice daily -- Continue aspirin and statin  Hyperlipidemia -- Crestor 40 mg p.o. daily  BPH -- Continue tamsulosin 0.4 mg p.o. daily -- Foley catheter removed 8/15, previously placed at OSH -- Closely monitor urine output  Prediabetes Hemoglobin A1c 6.3.  Outpatient follow-up with PCP.  COPD Not oxygen dependent. -- Dulera 2 puffs twice daily (hospital substitution for Trelegy Ellipta)  History of prostate/rectosigmoid carcinoma -- Continue outpatient follow-up with [facility] oncology  Weakness/debility/deconditioning: -- PT/OT recommending SNF -- TOC consulted for placement  DVT prophylaxis: heparin injection 5,000 Units Start: 05/24/23 1400    Code Status: Full Code Family Communication: No family present at bedside  Disposition Plan:  Level of care: Telemetry Medical Status is: Inpatient Remains inpatient appropriate because: Pending SNF placement, medically stable for discharge once bed available    Consultants:  Neurology  Procedures:  Continuous EEG  Antimicrobials:  None   Subjective: Patient seen examined bedside, resting calmly.  Lying in bed.  Remains confused; but seems to be slowly improving.  MRI negative for acute infarct yesterday.  No family present at bedside.  No other specific questions or concerns at this time.   No acute concerns overnight per nursing  staff.    Objective: Vitals:   05/28/23 0005 05/28/23 0401 05/28/23 0749 05/28/23 0838   BP: 121/80 (!) 157/82 127/80   Pulse: 100 (!) 104 (!) 104   Resp: 16 15 18    Temp: 97.7 F (36.5 C) 98.1 F (36.7 C) 98 F (36.7 C)   TempSrc: Axillary Axillary Oral   SpO2: 100% 98% 95% 97%  Height:        Intake/Output Summary (Last 24 hours) at 05/28/2023 1018 Last data filed at 05/28/2023 8295 Gross per 24 hour  Intake 3589.78 ml  Output 2300 ml  Net 1289.78 ml   There were no vitals filed for this visit.  Examination:  Physical Exam: GEN: NAD, alert, oriented self and place (Hospital/[place]) but not time (19XX)/situation, person (president: No answer), poor dentition, chronically ill in appearance HEENT: NCAT, PERRL, EOMI, sclera clear, MMM PULM: CTAB w/o wheezes/crackles, normal respiratory effort, on room air CV: RRR w/o M/G/R GI: abd soft, NTND, + BS MSK: no peripheral edema, moves all extremities independently NEURO: CN II-XII intact, no focal deficits other than his persistent confusion PSYCH: normal mood/affect Integumentary: dry/intact, no rashes or wounds    Data Reviewed: I have personally reviewed following labs and imaging studies  CBC: Recent Labs  Lab 05/24/23 0251 05/26/23 0357  WBC 9.9 7.2  NEUTROABS 7.1  --   HGB 12.6* 11.6*  HCT 39.4 36.3*  MCV 87.9 88.5  PLT 486* 431*   Basic Metabolic Panel: Recent Labs  Lab 05/24/23 0251 05/26/23 0357  NA 134* 138  K 3.7 4.2  CL 98 105  CO2 25 26  GLUCOSE 108* 110*  BUN <5* <5*  CREATININE 0.62 0.56*  CALCIUM 9.2 9.1   GFR: CrCl cannot be calculated (Unknown ideal weight.). Liver Function Tests: Recent Labs  Lab 05/24/23 0251  AST 18  ALT 12  ALKPHOS 151*  BILITOT 0.5  PROT 6.9  ALBUMIN 2.7*   No results for input(s): "LIPASE", "AMYLASE" in the last 168 hours. Recent Labs  Lab 05/24/23 0251  AMMONIA <10   Coagulation Profile: No results for input(s): "INR", "PROTIME" in the last 168 hours. Cardiac Enzymes: No results for input(s): "CKTOTAL", "CKMB", "CKMBINDEX", "TROPONINI" in  the last 168 hours. BNP (last 3 results) No results for input(s): "PROBNP" in the last 8760 hours. HbA1C: No results for input(s): "HGBA1C" in the last 72 hours.  CBG: Recent Labs  Lab 05/27/23 0557 05/27/23 1202 05/27/23 1830 05/28/23 0011 05/28/23 0546  GLUCAP 142* 174* 148* 138* 128*   Lipid Profile: No results for input(s): "CHOL", "HDL", "LDLCALC", "TRIG", "CHOLHDL", "LDLDIRECT" in the last 72 hours. Thyroid Function Tests: No results for input(s): "TSH", "T4TOTAL", "FREET4", "T3FREE", "THYROIDAB" in the last 72 hours.  Anemia Panel: No results for input(s): "VITAMINB12", "FOLATE", "FERRITIN", "TIBC", "IRON", "RETICCTPCT" in the last 72 hours.  Sepsis Labs: No results for input(s): "PROCALCITON", "LATICACIDVEN" in the last 168 hours.  Recent Results (from the past 240 hour(s))  Culture, blood (Routine X 2) w Reflex to ID Panel     Status: None (Preliminary result)   Collection Time: 05/24/23  2:49 AM   Specimen: BLOOD RIGHT HAND  Result Value Ref Range Status   Specimen Description BLOOD RIGHT HAND  Final   Special Requests   Final    BOTTLES DRAWN AEROBIC AND ANAEROBIC Blood Culture results may not be optimal due to an inadequate volume of blood received in culture bottles   Culture   Final    NO GROWTH 4 DAYS  Performed at [facility], [address] [place], [place] [address]    Report Status PENDING  Incomplete  Culture, blood (Routine X 2) w Reflex to ID Panel     Status: None (Preliminary result)   Collection Time: 05/24/23  2:51 AM   Specimen: BLOOD  Result Value Ref Range Status   Specimen Description BLOOD RIGHT ANTECUBITAL  Final   Special Requests   Final    BOTTLES DRAWN AEROBIC AND ANAEROBIC Blood Culture results may not be optimal due to an inadequate volume of blood received in culture bottles   Culture   Final    NO GROWTH 4 DAYS Performed at [facility], [address], [place], [place] [address]    Report Status PENDING   Incomplete         Radiology Studies: MR BRAIN W WO CONTRAST  Result Date: 05/28/2023 CLINICAL DATA:  Initial evaluation for TIA, seizure. EXAM: MRI HEAD WITHOUT AND WITH CONTRAST TECHNIQUE: Multiplanar, multiecho pulse sequences of the brain and surrounding structures were obtained without and with intravenous contrast. CONTRAST:  6mL GADAVIST GADOBUTROL 1 MMOL/ML IV SOLN COMPARISON:  None Available. FINDINGS: Brain: Examination degraded by motion artifact. Mild age-related cerebral atrophy. Patchy T2/FLAIR hyperintensity involving the periventricular deep white matter both cerebral hemispheres as well as the pons, consistent with chronic small vessel ischemic disease. Encephalomalacia and gliosis involving the left cerebral hemisphere, consistent with prior ischemic infarcts, watershed in distribution. No convincing abnormal foci of restricted diffusion to suggest acute or subacute ischemia. Gray-white matter differentiation maintained. No acute or chronic intracranial blood products. No mass lesion within the brain itself. No mass effect or midline shift. No hydrocephalus or extra-axial fluid collection. Pituitary gland and suprasellar region within normal limits. 7 mm focus of enhancement with an adjacent prominent vessel noted at the posterior left frontoparietal region (series 11, image 42). No significant surrounding mass effect or edema. Finding is favored to be vascular in nature, possibly due to the underlying chronic ischemic changes. No other abnormal parenchymal enhancement. Vascular: Major intracranial vascular flow voids are maintained. Skull and upper cervical spine: Craniocervical junction within normal limits. There is an abnormal T1 hypointense, enhancing lesion within the right lateral mass of T1 (series 7, image 16), indeterminate, but could reflect a small osseous metastasis. Bone marrow signal intensity otherwise within normal limits. No scalp soft tissue abnormality. Sinuses/Orbits:  Globes and orbital soft tissues within normal limits. Mild scattered mucosal thickening present about the ethmoidal air cells and maxillary sinuses. Paranasal sinuses are otherwise largely clear. No mastoid effusion. Other: 2.3 cm well-circumscribed cystic lesion within the subcutaneous fat of the left suboccipital region, likely a sebaceous cyst. IMPRESSION: 1. No acute intracranial abnormality. 2. Encephalomalacia and gliosis involving the left cerebral hemisphere, consistent with prior ischemic infarcts, watershed in distribution. 3. 7 mm focus of enhancement with an adjacent prominent vessel at the posterior left frontoparietal region, favored to be vascular in nature, possibly due to the underlying chronic ischemic changes. Attention at follow-up recommended. 4. Abnormal T1 hypointense, enhancing lesion within the right lateral mass of T1, indeterminate, but could reflect an osseous metastasis. 5. Underlying age-related cerebral atrophy with chronic small vessel ischemic disease. Electronically Signed   By: Rise Mu M.D.   On: 05/28/2023 00:32        Scheduled Meds:  amLODipine  5 mg Oral Daily   aspirin EC  81 mg Oral Daily   clopidogrel  75 mg Oral Daily   heparin  5,000 Units Subcutaneous Q8H   insulin aspart  0-9 Units Subcutaneous Q6H   levETIRAcetam  1,000 mg Oral BID   metoprolol tartrate  12.5 mg Oral BID   mometasone-formoterol  2 puff Inhalation BID   nicotine  21 mg Transdermal Daily   rosuvastatin  40 mg Oral Daily   sodium chloride flush  10-40 mL Intracatheter Q12H   tamsulosin  0.4 mg Oral Daily   [START ON 06/01/2023] thiamine (VITAMIN B1) injection  100 mg Intravenous Daily   Continuous Infusions:  dextrose 5% lactated ringers 100 mL/hr at 05/28/23 0855   thiamine (VITAMIN B1) injection 250 mg (05/28/23 0904)     LOS: 5 days    Time spent: 48 minutes spent on chart review, discussion with nursing staff, consultants, updating family and interview/physical  exam; more than 50% of that time was spent in counseling and/or coordination of care.    Alvira Philips [place], DO [facility] Available via Epic secure chat 7am-7pm After these hours, please refer to coverage provider listed on [email or web address] 05/28/2023, 10:18 AM

## 2023-05-28 NOTE — Plan of Care (Signed)

## 2023-05-29 DIAGNOSIS — R4182 Altered mental status, unspecified: Secondary | ICD-10-CM | POA: Diagnosis not present

## 2023-05-29 DIAGNOSIS — R569 Unspecified convulsions: Secondary | ICD-10-CM | POA: Diagnosis not present

## 2023-05-29 LAB — CULTURE, BLOOD (ROUTINE X 2)
Culture: NO GROWTH
Culture: NO GROWTH

## 2023-05-29 LAB — GLUCOSE, CAPILLARY
Glucose-Capillary: 139 mg/dL — ABNORMAL HIGH (ref 70–99)
Glucose-Capillary: 155 mg/dL — ABNORMAL HIGH (ref 70–99)
Glucose-Capillary: 137 mg/dL — ABNORMAL HIGH (ref 70–99)
Glucose-Capillary: 124 mg/dL — ABNORMAL HIGH (ref 70–99)

## 2023-05-29 LAB — TROPONIN I (HIGH SENSITIVITY): Troponin I (High Sensitivity): 4 ng/L (ref <18)

## 2023-05-29 LAB — RPR: RPR Ser Ql: NONREACTIVE

## 2023-05-29 MED ORDER — METOPROLOL TARTRATE 25 MG PO TABS
25.0000 mg | ORAL_TABLET | Freq: Two times a day (BID) | ORAL | Status: DC
  Administered 2023-05-29 (×2): 25 mg via ORAL
  Filled 2023-05-29 (×2): qty 1

## 2023-05-29 NOTE — Progress Notes (Signed)
Neurology progress note  Brief Review of HPI:  62 year old male with past medical history of COPD, CVA, HTN, BPH, prostate cancer stage IV, history of stage III rectosigmoid cancer.  He had a recent CVA approximately 8 weeks ago and a left carotid endarterectomy recently.  Immediately postop patient did well, but 2 days later he developed altered mentation.  MRI brain, CTA head and neck had no acute findings.  Teleneurology was consulted due to concern that the patient was having focal hemispheric seizures. Routine EEG showed left sided slowing but no epileptiform discharges. He was noted to have some episodes of R arm shaking and was started on Keppra 1000 mg BID for seizures.  Patient had repeat MRI and EEG that were unrevealing.  Patient's confusion persisted. He was transferred to [facility] for further assessment.    S: No acute events overnight   O: Patient is laying in the bed in NAD. No family is at the bedside. He is awake and alert. Still confused and with right hemiparesis. Vitamin B12 is 658   Vitals:   05/29/23 0739 05/29/23 0832  BP: (!) 140/82   Pulse: (!) 114   Resp: 18   Temp: 98.2 F (36.8 C)   SpO2: 98% 93%  et  General: Laying comfortably in bed; calm and cooperative HENT: Normal oropharynx and mucosa. Normal external appearance of ears and nose.  Neck: Supple, no pain or tenderness  CV: No JVD. No peripheral edema.  Pulmonary: Symmetric Chest rise. Normal respiratory effort.  Abdomen: Soft to touch, non-tender.  Ext: No cyanosis, edema, or deformity  Skin: No rash. Normal palpation of skin.   Musculoskeletal: Normal digits and nails by inspection. No clubbing.    Neurologic Examination  Mental status/Cognition: Alert, oriented to person and oriented to place as "hospital". For name of hospital he states "[place]." Disoriented to time and situation. Unable to state correct year, his age, the month or situation.  Speech/language: Speech is clear and in full sentences,  able to follow simple and complex commands Cranial nerves:   CN II Pupils equal and reactive to light,    CN III,IV,VI Extraocular movements intact with no nystagmus.   CN V Facial sensation symmetrical   CN VII Face symmetric resting and smiling   CN VIII Hearing intact to voice   CN IX & X Phonation normal   CN XI Head midline.   CN XII Midline tongue protrusion  Motor:  Muscle bulk is diminished, tone normal. 5/5 strength in left upper and lower extremities, 3/5 strength in right upper extremity and 3/5 strength in right lower extremity  Sensation: Responds to noxious stimuli  Coordination/Complex Motor:  - Finger to Nose intact on the left, unable to perform on the right due to weakness Gait: deferred for patient safety.  Data:  Overnight EEG 8/14-8/16/24: - Spikes, left posterior quadrant - Continuous slow, left hemisphere - Background asymmetry, left<right  MRI brain: No acute abnormality, encephalomalacia and gliosis in left cerebral hemisphere, 7 mm focus of enhancement within adjacent prominent vessel at the posterior left frontoparietal region, abnormal T1 hypointense enhancing lesion within right lateral mass of T1, atrophy and chronic small vessel ischemic disease   Unable to review images from OSH. Radiology reports document no acute abnormalities on CTH, CTA, and MRI Brain. Noted remote L frontoparietal encephalomalacia.  TSH 0.697 Folate 6.0 Ammonia less than 10 RPR non reactive  Thiamine pending B12  658   Current Facility-Administered Medications:    acetaminophen (TYLENOL) tablet 650 mg,  650 mg, Oral, Q6H PRN, 650 mg at 05/28/23 0842 **OR** acetaminophen (TYLENOL) suppository 650 mg, 650 mg, Rectal, Q6H PRN, Crosley, Debby, MD   amLODipine (NORVASC) tablet 5 mg, 5 mg, Oral, Daily, Crosley, Debby, MD, 5 mg at 05/29/23 0949   aspirin EC tablet 81 mg, 81 mg, Oral, Daily, Crosley, Debby, MD, 81 mg at 05/29/23 0948   bisacodyl (DULCOLAX) suppository 10 mg, 10  mg, Rectal, Daily PRN, Joneen Roach, Debby, MD   clopidogrel (PLAVIX) tablet 75 mg, 75 mg, Oral, Daily, Crosley, Debby, MD, 75 mg at 05/29/23 0948   heparin injection 5,000 Units, 5,000 Units, Subcutaneous, Q8H, Crosley, Debby, MD, 5,000 Units at 05/29/23 1500   insulin aspart (novoLOG) injection 0-9 Units, 0-9 Units, Subcutaneous, Q6H, Crosley, Debby, MD, 1 Units at 05/29/23 1816   levETIRAcetam (KEPPRA) tablet 1,000 mg, 1,000 mg, Oral, BID, 1,000 mg at 05/29/23 0948 **OR** [DISCONTINUED] levETIRAcetam (KEPPRA) IVPB 1000 mg/100 mL premix, 1,000 mg, Intravenous, BID, Erick Blinks, MD   metoprolol tartrate (LOPRESSOR) injection 2.5 mg, 2.5 mg, Intravenous, Q5 min PRN, Sundil, Subrina, MD   metoprolol tartrate (LOPRESSOR) tablet 25 mg, 25 mg, Oral, BID, [place], Donovan Persley J, DO, 25 mg at 05/29/23 0948   mometasone-formoterol (DULERA) 200-5 MCG/ACT inhaler 2 puff, 2 puff, Inhalation, BID, Crosley, Debby, MD, 2 puff at 05/29/23 0832   nicotine (NICODERM CQ - dosed in mg/24 hours) patch 21 mg, 21 mg, Transdermal, Daily, Sundil, Subrina, MD, 21 mg at 05/29/23 0949   rosuvastatin (CRESTOR) tablet 40 mg, 40 mg, Oral, Daily, Crosley, Debby, MD, 40 mg at 05/29/23 0948   sodium chloride flush (NS) 0.9 % injection 10-40 mL, 10-40 mL, Intracatheter, Q12H, [place], Taner Rzepka J, DO, 10 mL at 05/29/23 0954   sodium chloride flush (NS) 0.9 % injection 10-40 mL, 10-40 mL, Intracatheter, PRN, [place], Alvira Philips, DO   tamsulosin (FLOMAX) capsule 0.4 mg, 0.4 mg, Oral, Daily, Crosley, Debby, MD, 0.4 mg at 05/29/23 0949   [COMPLETED] thiamine (VITAMIN B1) 500 mg in sodium chloride 0.9 % 50 mL IVPB, 500 mg, Intravenous, Q8H, Stopped at 05/25/23 2310 **FOLLOWED BY** thiamine (VITAMIN B1) 250 mg in sodium chloride 0.9 % 50 mL IVPB, 250 mg, Intravenous, Daily, Last Rate: 100 mL/hr at 05/29/23 1011, 250 mg at 05/29/23 1011 **FOLLOWED BY** [START ON 06/01/2023] thiamine (VITAMIN B1) injection 100 mg, 100 mg, Intravenous, Daily, Erick Blinks, MD    Assessment: Brian Dudley is a 62 y.o. male with PMH significant for EtOh use, COPD, prior L frontoparietal stroke and HTN, who was transferred from outside hospital due to concern for focal L hemispheric seizures after L carotid endarterectomy. He was initially at his baseline post op, then noted to have worsening R sided weakness, aphasia and confusion with intermittent R hand jerking. Imaging at OSH with MRIs and routine EEGs were unrevealing per transferring MD, but the images are not available for our review. Teleneurology was consulted given concern patient was having focal hemispheric seizures as EEG was showing left-sided slowing but no LF deform discharges. He was noted to have some episodes of right arm shaking was started on Keppra 1000 mg twice daily for seizures. He was transferred to [facility] for in person neurology evaluation and cEEG. LTM x 48 hrs since transfer has showed spikes in the left posterior quadrant but no seizures.   - On exam today he is still confused, oriented to self and place as "hospital", but otherwise not oriented to place, time or situation. Right hemiparesis is also noted, with 3/5 strength.  -  In the context of his continued disorientation, he is hopeful that he will continue to improve.   - Brain MRI shows no acute abnormality to explain patient's altered mental status, but encephalomalacia and gliosis in left hemisphere do explain his right hemiparesis. - EEG with spikes, suggesting a seizure focus   - Labs: - RPR non-reactive - Thiamine level pending - B12 level is normal - BUN and Cr are low with eGFR > 60 - WBC normal  Recommendations: - Follow up on thiamine level when it results  - Continue daily thiamine  - Seizure precautions - Continue Keppra 1000 mg PO BID.  - Continue ASA, Plavix and rosuvastatin for secondary stroke prevention - PT/OT/Speech - OOB to chair daily - Maintain diurnal cycle: Lights on with activities and social  interaction during the day, lights and TV off at night - Avoid deliriogenic medications - PT/OT recommending SNF due to weakness/debility/deconditioning  Patient seen by NP. Gevena Mart DNP, ACNPC-AG  [facility]  Electronically signed: Dr. Caryl Pina

## 2023-05-29 NOTE — Progress Notes (Signed)
Physical Therapy Treatment  Patient Details Name: Brian Dudley MRN: [id number] DOB: 1960-11-02 Today's Date: 05/29/2023   History of Present Illness Pt is a 62 y/o male transferred 05/24/23 from [facility] in [place], [place]. Recent L carotid endarterectomy (8/2) with development of AMS and concern for focal hemispheric seizures. MRI Brain w/o contrast and CTA which were non revealing. EEG with evidence of epileptogenicity arising from left posterior quadrant. PMH: CVA (8 weeks ago per chart), COPD, hx of alcohol abuse, tobacco abuse, HTN, prostate cancer.    PT Comments  Pt progressing towards physical therapy goals. Was able to perform transfers with up to +2 min assist and RW for support. Standing balance appears to be improving. May be able to transition to hemi walker for gait training. Therapist providing total assist to keep R hand on RW throughout transfer. Pt sleepy upon entry and did not appear to be agreeable initially but participated well with encouragement. Will continue to follow and progress as able per POC.    If plan is discharge home, recommend the following: Two people to help with walking and/or transfers;A lot of help with bathing/dressing/bathroom;Assistance with cooking/housework;Direct supervision/assist for medications management;Direct supervision/assist for financial management;Help with stairs or ramp for entrance;Assist for transportation;Supervision due to cognitive status   Can travel by private vehicle     No  Equipment Recommendations  Other (comment) (defer to next venue of care)    Recommendations for Other Services       Precautions / Restrictions Precautions Precautions: Fall Restrictions Weight Bearing Restrictions: No     Mobility  Bed Mobility Overal bed mobility: Needs Assistance Bed Mobility: Supine to Sit, Sit to Supine     Supine to sit: Mod assist, HOB elevated, Used rails     General bed mobility comments: Hand  over hand assist to reach for rails. Pt attempting to reach for physical assist from therapist instead. HHA required as pt unable to reach bed rail until end of transfer. Increased time to gain/maintain sitting balance.    Transfers Overall transfer level: Needs assistance Equipment used: Rolling walker (2 wheels), None Transfers: Sit to/from Stand, Bed to chair/wheelchair/BSC Sit to Stand: Min assist, +2 physical assistance, +2 safety/equipment   Step pivot transfers: Min assist, +2 physical assistance, +2 safety/equipment       General transfer comment: Assist for RUE on walker, and for balance support with transition from bed>chair. Required multimodal cues throughout as pt continually taking steps away from the chair, not towards it.    Ambulation/Gait                   Stairs             Wheelchair Mobility     Tilt Bed    Modified Rankin (Stroke Patients Only) Modified Rankin (Stroke Patients Only) Pre-Morbid Rankin Score: Slight disability Modified Rankin: Moderately severe disability     Balance Overall balance assessment: Needs assistance Sitting-balance support: No upper extremity supported, Feet supported Sitting balance-Leahy Scale: Fair     Standing balance support: Bilateral upper extremity supported, During functional activity Standing balance-Leahy Scale: Poor Standing balance comment: reliant on at least single UE support                            Cognition Arousal: Alert Behavior During Therapy: Flat affect, Impulsive Overall Cognitive Status: Impaired/Different from baseline Area of Impairment: Attention, Memory, Following commands, Safety/judgement, Awareness, Problem solving, Orientation  Orientation Level: Disoriented to, Time, Situation Current Attention Level: Sustained Memory: Decreased short-term memory Following Commands: Follows one step commands with increased time, Follows one step  commands inconsistently (Needs repetition) Safety/Judgement: Decreased awareness of safety, Decreased awareness of deficits Awareness: Intellectual Problem Solving: Slow processing, Decreased initiation, Difficulty sequencing, Requires verbal cues General Comments: Adamant that he does not need to use a walker, and that he can get up on his own. Sexually inappropriate with ongoing comments throughout session regarding the male staff.        Exercises      General Comments        Pertinent Vitals/Pain Pain Assessment Pain Assessment: No/denies pain Faces Pain Scale: No hurt    Home Living                          Prior Function            PT Goals (current goals can now be found in the care plan section) Acute Rehab PT Goals Patient Stated Goal: "Get out of here" PT Goal Formulation: With patient Time For Goal Achievement: 06/07/23 Potential to Achieve Goals: Good Progress towards PT goals: Progressing toward goals    Frequency    Min 1X/week      PT Plan      Co-evaluation              AM-PAC PT "6 Clicks" Mobility   Outcome Measure  Help needed turning from your back to your side while in a flat bed without using bedrails?: A Lot Help needed moving from lying on your back to sitting on the side of a flat bed without using bedrails?: A Lot Help needed moving to and from a bed to a chair (including a wheelchair)?: A Lot Help needed standing up from a chair using your arms (e.g., wheelchair or bedside chair)?: A Lot Help needed to walk in hospital room?: Total Help needed climbing 3-5 steps with a railing? : Total 6 Click Score: 10    End of Session Equipment Utilized During Treatment: Gait belt Activity Tolerance: Patient tolerated treatment well Patient left: in chair;with call bell/phone within reach;with chair alarm set Nurse Communication: Mobility status PT Visit Diagnosis: Unsteadiness on feet (R26.81);Other abnormalities of gait  and mobility (R26.89);Muscle weakness (generalized) (M62.81);Difficulty in walking, not elsewhere classified (R26.2);Other symptoms and signs involving the nervous system (R29.898)     Time: 1610-9604 PT Time Calculation (min) (ACUTE ONLY): 21 min  Charges:    $Gait Training: 8-22 mins PT General Charges $$ ACUTE PT VISIT: 1 Visit                     Conni Slipper, PT, DPT Acute Rehabilitation Services Secure Chat Preferred Office: [phone]    Brian Dudley 05/29/2023, 12:37 PM

## 2023-05-29 NOTE — Progress Notes (Signed)
Tele called stated had a 13 beat run of atrial tachycardia. Asymptomatic and vital signs unremarkable except pulse is 119. Notified Dr. [place], no new orders at this time. Patient is eating and watching TV with no signs of distress.

## 2023-05-29 NOTE — Progress Notes (Addendum)
PROGRESS NOTE    Brian Dudley  VQQ:[id number] DOB: 10/06/1961 DOA: 05/23/2023 PCP: Patient, No Pcp Per    Brief Narrative:   Brian Dudley is a 62 y.o. male with past medical history significant for COPD, CVA, HTN, BPH, stage IV prostate cancer, history of stage III rectosigmoid cancer, recent CVA approximately 8 weeks ago with recent left carotid endarterectomy who presented to [facility] as a transfer from [facility] hospital in [place] with concern of seizures.  Following his endarterectomy, patient was doing well postoperatively but 2 days later he developed altered mentation.  He [name] presented to [facility] with MRI brain, CTA head/neck with no acute findings.  Teleneurology was consulted given concern patient was having focal hemispheric seizures as EEG was showing left-sided slowing but no LF deform discharges.  He was noted to have some episodes of right arm shaking was started on Keppra 1000 mg twice daily for seizures.  Patient had repeat MRI and EEG that was unrevealing; although patient was confused and persisted.  [facility] vascular surgeon discussed with neurology who recommended transfer to [facility] as their facility does not have an in-house neurologist or continuous EEG available.  Patient was unable to give any further history due to his confusion.  Assessment & Plan:    Seizure Patient presented to [facility] with confusion, aphasia and intermittent right hand jerking.  Recent CVA and underwent a left carotid endarterectomy and symptoms onset 2 days following procedure.  Imaging at OSH with MRI and routine EEG were unrevealing.  Was started on Keppra 1000 mg BID and transferred to [facility] for in person neurology evaluation with continuous EEG. EEG with spikes left posterior quadrant, continuous slow left hemisphere; no seizure or left form discharges noted.  MR brain without contrast  with no acute intracranial abnormality, noted encephalopathy left cerebral hemisphere consistent with prior infarct. -- Neurology following, appreciate assistance -- Keppra 1000 mg BID -- MRI brain with and without contrast: Pending -- Dysphagia 2 diet, SLP following -- RPR, vitamin B1 level: Pending -- Further per neurology  Recent CVA Hx carotid artery stenosis s/p left carotid endarterectomy -- Plavix 75 mg p.o. daily -- Aspirin 81 mg p.o. daily -- Crestor 40 mg p.o. daily  Essential hypertension -- Metoprolol tartrate 25 mg p.o. twice daily -- Continue aspirin and statin  Hyperlipidemia -- Crestor 40 mg p.o. daily  BPH -- Continue tamsulosin 0.4 mg p.o. daily -- Foley catheter removed 8/15, previously placed at OSH -- Closely monitor urine output  Prediabetes Hemoglobin A1c 6.3.  Outpatient follow-up with PCP.  COPD Not oxygen dependent. -- Dulera 2 puffs twice daily (hospital substitution for Trelegy Ellipta)  History of prostate/rectosigmoid carcinoma -- Continue outpatient follow-up with [facility] oncology  Tobacco use disorder -- Nicotine patch  Weakness/debility/deconditioning: -- PT/OT recommending SNF -- TOC consulted for placement  DVT prophylaxis: heparin injection 5,000 Units Start: 05/24/23 1400    Code Status: Full Code Family Communication: No family present at bedside  Disposition Plan:  Level of care: Telemetry Medical Status is: Inpatient Remains inpatient appropriate because: Pending SNF placement, medically stable for discharge once bed available    Consultants:  Neurology  Procedures:  Continuous EEG  Antimicrobials:  None   Subjective: Patient seen examined bedside, resting calmly.  Lying in bed.  Remains confused; but seems to be slowly improving.  Seen by neurology yesterday, ordered RPR, vitamin B-1 level.  No family present at bedside.  No other  specific questions or concerns at this time.   No acute concerns overnight per nursing  staff.    Objective: Vitals:   05/28/23 2349 05/29/23 0352 05/29/23 0739 05/29/23 0832  BP: 101/70 129/81 (!) 140/82   Pulse: (!) 108 (!) 116 (!) 114   Resp: 20 18 18    Temp: 98.2 F (36.8 C) 98.1 F (36.7 C) 98.2 F (36.8 C)   TempSrc: Axillary Axillary Oral   SpO2: 97% 98% 98% 93%  Height:        Intake/Output Summary (Last 24 hours) at 05/29/2023 0944 Last data filed at 05/29/2023 5784 Gross per 24 hour  Intake 2296.71 ml  Output 2450 ml  Net -153.29 ml   There were no vitals filed for this visit.  Examination:  Physical Exam: GEN: NAD, alert, oriented self and place ([place]; although reports [facility] which was the hospital he was at prior to transfer) but not time (1965)/situation, person (president: "I gave up on him"), poor dentition, chronically ill in appearance HEENT: NCAT, PERRL, EOMI, sclera clear, MMM PULM: CTAB w/o wheezes/crackles, normal respiratory effort, on room air CV: RRR w/o M/G/R GI: abd soft, NTND, + BS MSK: no peripheral edema, moves all extremities independently NEURO: CN II-XII intact, no focal deficits other than his persistent confusion PSYCH: normal mood/affect Integumentary: dry/intact, no rashes or wounds    Data Reviewed: I have personally reviewed following labs and imaging studies  CBC: Recent Labs  Lab 05/24/23 0251 05/26/23 0357  WBC 9.9 7.2  NEUTROABS 7.1  --   HGB 12.6* 11.6*  HCT 39.4 36.3*  MCV 87.9 88.5  PLT 486* 431*   Basic Metabolic Panel: Recent Labs  Lab 05/24/23 0251 05/26/23 0357  NA 134* 138  K 3.7 4.2  CL 98 105  CO2 25 26  GLUCOSE 108* 110*  BUN <5* <5*  CREATININE 0.62 0.56*  CALCIUM 9.2 9.1   GFR: CrCl cannot be calculated (Unknown ideal weight.). Liver Function Tests: Recent Labs  Lab 05/24/23 0251  AST 18  ALT 12  ALKPHOS 151*  BILITOT 0.5  PROT 6.9  ALBUMIN 2.7*   No results for input(s): "LIPASE", "AMYLASE" in the last 168 hours. Recent Labs  Lab  05/24/23 0251  AMMONIA <10   Coagulation Profile: No results for input(s): "INR", "PROTIME" in the last 168 hours. Cardiac Enzymes: No results for input(s): "CKTOTAL", "CKMB", "CKMBINDEX", "TROPONINI" in the last 168 hours. BNP (last 3 results) No results for input(s): "PROBNP" in the last 8760 hours. HbA1C: No results for input(s): "HGBA1C" in the last 72 hours.  CBG: Recent Labs  Lab 05/28/23 0011 05/28/23 0546 05/28/23 1205 05/28/23 2353 05/29/23 0603  GLUCAP 138* 128* 132* 127* 139*   Lipid Profile: No results for input(s): "CHOL", "HDL", "LDLCALC", "TRIG", "CHOLHDL", "LDLDIRECT" in the last 72 hours. Thyroid Function Tests: No results for input(s): "TSH", "T4TOTAL", "FREET4", "T3FREE", "THYROIDAB" in the last 72 hours.  Anemia Panel: Recent Labs    05/28/23 1230  VITAMINB12 658    Sepsis Labs: No results for input(s): "PROCALCITON", "LATICACIDVEN" in the last 168 hours.  Recent Results (from the past 240 hour(s))  Culture, blood (Routine X 2) w Reflex to ID Panel     Status: None   Collection Time: 05/24/23  2:49 AM   Specimen: BLOOD RIGHT HAND  Result Value Ref Range Status   Specimen Description BLOOD RIGHT HAND  Final   Special Requests   Final    BOTTLES DRAWN AEROBIC AND ANAEROBIC Blood  Culture results may not be optimal due to an inadequate volume of blood received in culture bottles   Culture   Final    NO GROWTH 5 DAYS Performed at [facility], [address], [place], [place] [address]    Report Status 05/29/2023 FINAL  Final  Culture, blood (Routine X 2) w Reflex to ID Panel     Status: None   Collection Time: 05/24/23  2:51 AM   Specimen: BLOOD  Result Value Ref Range Status   Specimen Description BLOOD RIGHT ANTECUBITAL  Final   Special Requests   Final    BOTTLES DRAWN AEROBIC AND ANAEROBIC Blood Culture results may not be optimal due to an inadequate volume of blood received in culture bottles   Culture   Final    NO GROWTH 5  DAYS Performed at [facility], [address] [place], [place] [address]    Report Status 05/29/2023 FINAL  Final         Radiology Studies: MR BRAIN W WO CONTRAST  Result Date: 05/28/2023 CLINICAL DATA:  Initial evaluation for TIA, seizure. EXAM: MRI HEAD WITHOUT AND WITH CONTRAST TECHNIQUE: Multiplanar, multiecho pulse sequences of the brain and surrounding structures were obtained without and with intravenous contrast. CONTRAST:  6mL GADAVIST GADOBUTROL 1 MMOL/ML IV SOLN COMPARISON:  None Available. FINDINGS: Brain: Examination degraded by motion artifact. Mild age-related cerebral atrophy. Patchy T2/FLAIR hyperintensity involving the periventricular deep white matter both cerebral hemispheres as well as the pons, consistent with chronic small vessel ischemic disease. Encephalomalacia and gliosis involving the left cerebral hemisphere, consistent with prior ischemic infarcts, watershed in distribution. No convincing abnormal foci of restricted diffusion to suggest acute or subacute ischemia. Gray-white matter differentiation maintained. No acute or chronic intracranial blood products. No mass lesion within the brain itself. No mass effect or midline shift. No hydrocephalus or extra-axial fluid collection. Pituitary gland and suprasellar region within normal limits. 7 mm focus of enhancement with an adjacent prominent vessel noted at the posterior left frontoparietal region (series 11, image 42). No significant surrounding mass effect or edema. Finding is favored to be vascular in nature, possibly due to the underlying chronic ischemic changes. No other abnormal parenchymal enhancement. Vascular: Major intracranial vascular flow voids are maintained. Skull and upper cervical spine: Craniocervical junction within normal limits. There is an abnormal T1 hypointense, enhancing lesion within the right lateral mass of T1 (series 7, image 16), indeterminate, but could reflect a small osseous  metastasis. Bone marrow signal intensity otherwise within normal limits. No scalp soft tissue abnormality. Sinuses/Orbits: Globes and orbital soft tissues within normal limits. Mild scattered mucosal thickening present about the ethmoidal air cells and maxillary sinuses. Paranasal sinuses are otherwise largely clear. No mastoid effusion. Other: 2.3 cm well-circumscribed cystic lesion within the subcutaneous fat of the left suboccipital region, likely a sebaceous cyst. IMPRESSION: 1. No acute intracranial abnormality. 2. Encephalomalacia and gliosis involving the left cerebral hemisphere, consistent with prior ischemic infarcts, watershed in distribution. 3. 7 mm focus of enhancement with an adjacent prominent vessel at the posterior left frontoparietal region, favored to be vascular in nature, possibly due to the underlying chronic ischemic changes. Attention at follow-up recommended. 4. Abnormal T1 hypointense, enhancing lesion within the right lateral mass of T1, indeterminate, but could reflect an osseous metastasis. 5. Underlying age-related cerebral atrophy with chronic small vessel ischemic disease. Electronically Signed   By: Rise Mu M.D.   On: 05/28/2023 00:32  Scheduled Meds:  amLODipine  5 mg Oral Daily   aspirin EC  81 mg Oral Daily   clopidogrel  75 mg Oral Daily   heparin  5,000 Units Subcutaneous Q8H   insulin aspart  0-9 Units Subcutaneous Q6H   levETIRAcetam  1,000 mg Oral BID   metoprolol tartrate  25 mg Oral BID   mometasone-formoterol  2 puff Inhalation BID   nicotine  21 mg Transdermal Daily   rosuvastatin  40 mg Oral Daily   sodium chloride flush  10-40 mL Intracatheter Q12H   tamsulosin  0.4 mg Oral Daily   [START ON 06/01/2023] thiamine (VITAMIN B1) injection  100 mg Intravenous Daily   Continuous Infusions:  dextrose 5% lactated ringers 100 mL/hr at 05/29/23 1610   thiamine (VITAMIN B1) injection Stopped (05/28/23 0934)     LOS: 6 days    Time  spent: 48 minutes spent on chart review, discussion with nursing staff, consultants, updating family and interview/physical exam; more than 50% of that time was spent in counseling and/or coordination of care.    Alvira Philips [place], DO [facility] Available via Epic secure chat 7am-7pm After these hours, please refer to coverage provider listed on [email or web address] 05/29/2023, 9:44 AM

## 2023-05-29 NOTE — TOC Progression Note (Signed)
Transition of Care [facility]) - Progression Note    Patient Details  Name: Brian Dudley MRN: [id number] Date of Birth: 02/17/1961  Transition of Care [facility]) CM/SW Contact  [name], [place] Phone Number: 05/29/2023, 1:15 PM  Clinical Narrative:   CSW spoke with daughter, [name], about bed offers, and daughter would like to choose [facility]. CSW confirmed with [facility] that they have a bed available for patient. CSW sent request to CMA to initiate insurance authorization. CSW to follow.    Expected Discharge Plan: Skilled Nursing Facility Barriers to Discharge: Continued Medical Work up, English as a second language teacher  Expected Discharge Plan and Services     Post Acute Care Choice: Skilled Nursing Facility Living arrangements for the past 2 months: Single Family Home                                       Social Determinants of Health (SDOH) Interventions SDOH Screenings   Food Insecurity: Patient Unable To Answer (05/24/2023)  Housing: Patient Unable To Answer (05/24/2023)  Transportation Needs: No Transportation Needs (09/09/2019)   Received from [facility]  Utilities: Patient Unable To Answer (05/24/2023)  Financial Resource Strain: Low Risk  (09/09/2019)   Received from [facility]  Physical Activity: Inactive (11/07/2018)   Received from [facility]  Social Connections: Unknown (12/06/2022)   Received from [facility]  Stress: Stress Concern Present (11/07/2018)   Received from [facility]  Tobacco Use: High Risk (05/24/2023)    Readmission Risk Interventions     No data to display

## 2023-05-29 NOTE — Plan of Care (Signed)
  Problem: Health Behavior/Discharge Planning: Goal: Ability to manage health-related needs will improve Outcome: Progressing   

## 2023-05-29 NOTE — Plan of Care (Signed)
  Problem: Nutrition: Goal: Adequate nutrition will be maintained Outcome: Progressing   Problem: Coping: Goal: Level of anxiety will decrease Outcome: Progressing   Problem: Safety: Goal: Ability to remain free from injury will improve Outcome: Progressing   Problem: Skin Integrity: Goal: Risk for impaired skin integrity will decrease Outcome: Progressing   

## 2023-05-29 NOTE — Progress Notes (Signed)
OT Cancellation Note  Patient Details Name: Brian Dudley MRN: [id number] DOB: [birth year]   Cancelled Treatment:    Reason Eval/Treat Not Completed: Other (comment).  Patient just returned to bed, OT will check back later.    Brian Dudley D Brian Dudley 05/29/2023, 1:32 PM 05/29/2023  RP, OTR/L  Acute Rehabilitation Services  Office:  [phone]

## 2023-05-29 NOTE — Progress Notes (Signed)
Occupational Therapy Treatment Patient Details Name: Brian Dudley MRN: [id number] DOB: September 19, 1961 Today's Date: 05/29/2023   History of present illness Pt is a 62 y/o male transferred 05/24/23 from [facility] in [place], [place]. Recent L carotid endarterectomy (8/2) with development of AMS and concern for focal hemispheric seizures. MRI Brain w/o contrast and CTA which were non revealing. EEG with evidence of epileptogenicity arising from left posterior quadrant. PMH: CVA (8 weeks ago per chart), COPD, hx of alcohol abuse, tobacco abuse, HTN, prostate cancer.   OT comments  Patient with fair progress toward patient focused goals.  Tried hemi walker with patient for transfers, needing Mod A, cues and balance support.  Patient difficulty with static sitting balance and needing Mod to Total A for ADL completion.  OT to continue efforts in the acute setting, and Patient will benefit from continued inpatient follow up therapy, <3 hours/day       If plan is discharge home, recommend the following:  A lot of help with walking and/or transfers;Two people to help with walking and/or transfers;A lot of help with bathing/dressing/bathroom;Two people to help with bathing/dressing/bathroom   Equipment Recommendations       Recommendations for Other Services      Precautions / Restrictions Precautions Precautions: Fall Restrictions Weight Bearing Restrictions: No       Mobility Bed Mobility Overal bed mobility: Needs Assistance Bed Mobility: Supine to Sit, Sit to Supine     Supine to sit: Mod assist, HOB elevated, Used rails Sit to supine: Mod assist        Transfers Overall transfer level: Needs assistance Equipment used: Hemi-walker Transfers: Sit to/from Stand, Bed to chair/wheelchair/BSC Sit to Stand: Min assist     Step pivot transfers: Mod assist           Balance Overall balance assessment: Needs assistance Sitting-balance support: No upper extremity  supported, Feet supported Sitting balance-Leahy Scale: Poor   Postural control: Posterior lean Standing balance support: Reliant on assistive device for balance Standing balance-Leahy Scale: Poor                             ADL either performed or assessed with clinical judgement   ADL       Grooming: Minimal assistance;Bed level   Upper Body Bathing: Maximal assistance;Sitting   Lower Body Bathing: Total assistance;Maximal assistance   Upper Body Dressing : Maximal assistance   Lower Body Dressing: Maximal assistance;Total assistance       Toileting- Clothing Manipulation and Hygiene: Total assistance;Bed level              Extremity/Trunk Assessment Upper Extremity Assessment Upper Extremity Assessment: RUE deficits/detail RUE Sensation: decreased light touch RUE Coordination: decreased fine motor;decreased gross motor   Lower Extremity Assessment Lower Extremity Assessment: Defer to PT evaluation   Cervical / Trunk Assessment Cervical / Trunk Assessment: Kyphotic    Vision   Vision Assessment?: Vision impaired- to be further tested in functional context Additional Comments: looking and scanning to the R, but continnues to be difficult to assess due to cognition   Perception Perception Perception: Not tested   Praxis Praxis Praxis: Not tested    Cognition Arousal: Alert Behavior During Therapy: Flat affect, Impulsive Overall Cognitive Status: No family/caregiver present to determine baseline cognitive functioning                     Current Attention Level: Sustained  Safety/Judgement: Decreased awareness of safety, Decreased awareness of deficits   Problem Solving: Slow processing, Requires verbal cues, Requires tactile cues          Exercises      Shoulder Instructions       General Comments      Pertinent Vitals/ Pain       Pain Assessment Pain Assessment: Faces Faces Pain Scale: Hurts little more Pain  Location: generalized with RUE movement, unable to report location of pain Pain Descriptors / Indicators: Grimacing, Guarding Pain Intervention(s): Monitored during session                                                          Frequency  Min 1X/week        Progress Toward Goals  OT Goals(current goals can now be found in the care plan section)  Progress towards OT goals: Progressing toward goals  Acute Rehab OT Goals OT Goal Formulation: With patient Time For Goal Achievement: 06/07/23 Potential to Achieve Goals: Fair  Plan      Co-evaluation                 AM-PAC OT "6 Clicks" Daily Activity     Outcome Measure   Help from another person eating meals?: Total Help from another person taking care of personal grooming?: A Lot Help from another person toileting, which includes using toliet, bedpan, or urinal?: A Lot Help from another person bathing (including washing, rinsing, drying)?: A Lot Help from another person to put on and taking off regular upper body clothing?: A Lot Help from another person to put on and taking off regular lower body clothing?: A Lot 6 Click Score: 11    End of Session Equipment Utilized During Treatment: Rolling walker (2 wheels)  OT Visit Diagnosis: Unsteadiness on feet (R26.81);Other abnormalities of gait and mobility (R26.89);Muscle weakness (generalized) (M62.81)   Activity Tolerance Patient tolerated treatment well   Patient Left in bed;with call bell/phone within reach;with bed alarm set   Nurse Communication Mobility status        Time: 1610-9604 OT Time Calculation (min): 16 min  Charges: OT General Charges $OT Visit: 1 Visit OT Treatments $Self Care/Home Management : 8-22 mins  05/29/2023  RP, OTR/L  Acute Rehabilitation Services  Office:  [phone]   Suzanna Obey 05/29/2023, 3:31 PM

## 2023-05-30 DIAGNOSIS — R569 Unspecified convulsions: Secondary | ICD-10-CM | POA: Diagnosis not present

## 2023-05-30 LAB — CBC
HCT: 38.9 % — ABNORMAL LOW (ref 39.0–52.0)
Hemoglobin: 12.4 g/dL — ABNORMAL LOW (ref 13.0–17.0)
MCH: 27.9 pg (ref 26.0–34.0)
MCHC: 31.9 g/dL (ref 30.0–36.0)
MCV: 87.4 fL (ref 80.0–100.0)
Platelets: 358 10*3/uL (ref 150–400)
RBC: 4.45 MIL/uL (ref 4.22–5.81)
RDW: 13 % (ref 11.5–15.5)
WBC: 13 10*3/uL — ABNORMAL HIGH (ref 4.0–10.5)
nRBC: 0 % (ref 0.0–0.2)

## 2023-05-30 LAB — BASIC METABOLIC PANEL
Anion gap: 10 (ref 5–15)
BUN: <5 mg/dL — ABNORMAL LOW (ref 8–23)
CO2: 24 mmol/L (ref 22–32)
Calcium: 9.1 mg/dL (ref 8.9–10.3)
Chloride: 99 mmol/L (ref 98–111)
Creatinine, Ser: 0.73 mg/dL (ref 0.61–1.24)
GFR, Estimated: >60 mL/min (ref >60)
Glucose, Bld: 123 mg/dL — ABNORMAL HIGH (ref 70–99)
Potassium: 4.6 mmol/L (ref 3.5–5.1)
Sodium: 133 mmol/L — ABNORMAL LOW (ref 135–145)

## 2023-05-30 LAB — GLUCOSE, CAPILLARY
Glucose-Capillary: 103 mg/dL — ABNORMAL HIGH (ref 70–99)
Glucose-Capillary: 119 mg/dL — ABNORMAL HIGH (ref 70–99)
Glucose-Capillary: 145 mg/dL — ABNORMAL HIGH (ref 70–99)
Glucose-Capillary: 145 mg/dL — ABNORMAL HIGH (ref 70–99)

## 2023-05-30 LAB — MAGNESIUM: Magnesium: 2 mg/dL (ref 1.7–2.4)

## 2023-05-30 MED ORDER — LEVETIRACETAM 1000 MG PO TABS: 1000.0000 mg | ORAL_TABLET | Freq: Two times a day (BID) | ORAL | Status: DC

## 2023-05-30 MED ORDER — METOPROLOL TARTRATE 50 MG PO TABS: 50.0000 mg | ORAL_TABLET | Freq: Two times a day (BID) | ORAL | Status: AC

## 2023-05-30 MED ORDER — SODIUM CHLORIDE 0.9 % IV SOLN: INTRAVENOUS | Status: DC

## 2023-05-30 MED ORDER — METOPROLOL TARTRATE 50 MG PO TABS
50.0000 mg | ORAL_TABLET | Freq: Two times a day (BID) | ORAL | Status: DC
  Administered 2023-05-30: 50 mg via ORAL
  Filled 2023-05-30: qty 1

## 2023-05-30 MED ORDER — ASPIRIN 81 MG PO TBEC: 81.0000 mg | DELAYED_RELEASE_TABLET | Freq: Every day | ORAL | Status: AC

## 2023-05-30 NOTE — Progress Notes (Signed)
Discharged via [facility]AR to [facility]. He was clean and dry, belongings were taken with him.

## 2023-05-30 NOTE — Progress Notes (Signed)
This chaplain responded to unit referral for HCPOA. The Pt. daughter-[name] Thaut is at the bedside. The Pt. is sleeping at the time of the visit.  The chaplain understands [name] hopes to complete HCPOA before the Pt. discharge.  [name] participated in [place] education. [name] expressed an understanding of the role and purpose of HCPOA. The chaplain provided education of the necessity of the Pt. having the ability to understand the role/purpose of HCPOA before proceeding with completion.  This chaplain made the referral to Chaplain Val to follow up.  Chaplain Stephanie Acre

## 2023-05-30 NOTE — Progress Notes (Addendum)
PROGRESS NOTE    Croix Klaver  ZOX:[id number] DOB: May 18, 1961 DOA: 05/23/2023 PCP: Patient, No Pcp Per    Brief Narrative:   Brian Dudley is a 62 y.o. male with past medical history significant for COPD, CVA, HTN, BPH, stage IV prostate cancer, history of stage III rectosigmoid cancer, recent CVA approximately 8 weeks ago with recent left carotid endarterectomy who presented to [facility] as a transfer from [facility] in [place] with concern of seizures.  Following his endarterectomy, patient was doing well postoperatively but 2 days later he developed altered mentation.  He [name] presented to [facility] with MRI brain, CTA head/neck with no acute findings.  Teleneurology was consulted given concern patient was having focal hemispheric seizures as EEG was showing left-sided slowing but no LF deform discharges.  He was noted to have some episodes of right arm shaking was started on Keppra 1000 mg twice daily for seizures.  Patient had repeat MRI and EEG that was unrevealing; although patient was confused and persisted.  [facility] vascular surgeon discussed with neurology who recommended transfer to [facility] as their facility does not have an in-house neurologist or continuous EEG available.  Patient was unable to give any further history due to his confusion.  Assessment & Plan:    Seizure Patient presented to [facility] with confusion, aphasia and intermittent right hand jerking.  Recent CVA and underwent a left carotid endarterectomy and symptoms onset 2 days following procedure.  Imaging at OSH with MRI and routine EEG were unrevealing.  Was started on Keppra 1000 mg BID and transferred to [facility] for in person neurology evaluation with continuous EEG. EEG with spikes left posterior quadrant, continuous slow left hemisphere; no seizure or left form discharges noted.  MR brain without contrast  with no acute intracranial abnormality, noted encephalopathy left cerebral hemisphere consistent with prior infarct.  Vitamin B12 within normal limits, RPR nonreactive. -- Neurology following, appreciate assistance -- Keppra 1000 mg BID -- Dysphagia 2 diet, SLP following -- vitamin B1 level: Pending -- Neurology now following at a distance regarding B1 level, medically stable for discharge once bed available  Recent CVA Hx carotid artery stenosis s/p left carotid endarterectomy -- Plavix 75 mg p.o. daily -- Aspirin 81 mg p.o. daily -- Crestor 40 mg p.o. daily  Atrial tachycardia Patient with 13 beat run of atrial tachycardia yesterday, asymptomatic. -- Reviewed telemetry this morning, sinus tachycardia with a rate of 115 -- Increase metoprolol tartrate to 50 mg p.o. twice daily -- Continue to monitor on telemetry  Essential hypertension -- Metoprolol tartrate 50 mg p.o. twice daily -- Continue aspirin and statin  Hyperlipidemia -- Crestor 40 mg p.o. daily  BPH -- Continue tamsulosin 0.4 mg p.o. daily -- Foley catheter removed 8/15, previously placed at OSH -- Closely monitor urine output  Prediabetes Hemoglobin A1c 6.3.  Outpatient follow-up with PCP.  COPD Not oxygen dependent. -- Dulera 2 puffs twice daily (hospital substitution for Trelegy Ellipta)  History of prostate/rectosigmoid carcinoma -- Continue outpatient follow-up with [facility] oncology  Tobacco use disorder -- Nicotine patch  Weakness/debility/deconditioning: -- PT/OT recommending SNF -- TOC consulted for placement  DVT prophylaxis: heparin injection 5,000 Units Start: 05/24/23 1400    Code Status: Full Code Family Communication: No family present at bedside  Disposition Plan:  Level of care: Telemetry Medical Status is: Inpatient Remains inpatient appropriate because: Pending SNF placement, medically stable for discharge once bed available  Consultants:  Neurology  Procedures:  Continuous  EEG  Antimicrobials:  None   Subjective: Patient seen examined bedside, resting calmly.  Lying in bed.  Remains confused.  Vitamin B1 level pending.  No family present at bedside.  Patient with no other specific questions or concerns this morning.  RN reports 13 beat run of atrial tachycardia yesterday, patient without symptoms and vital signs otherwise stable.  No other acute concerns overnight per nursing staff.  Medically stable for discharge to SNF once bed available/receive insurance authorization.  Objective: Vitals:   05/29/23 2158 05/30/23 0013 05/30/23 0428 05/30/23 0827  BP:  126/80 (!) 149/84 132/80  Pulse:  (!) 108 (!) 114 (!) 110  Resp:  20 19 20   Temp:  99.1 F (37.3 C) 98.8 F (37.1 C) 98.2 F (36.8 C)  TempSrc:  Oral Oral Oral  SpO2: 96% 96% 98% 94%  Height:        Intake/Output Summary (Last 24 hours) at 05/30/2023 1015 Last data filed at 05/30/2023 0647 Gross per 24 hour  Intake --  Output 1600 ml  Net -1600 ml   There were no vitals filed for this visit.  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"), poor dentition, chronically ill in appearance HEENT: NCAT, PERRL, EOMI, sclera clear, MMM PULM: CTAB w/o wheezes/crackles, normal respiratory effort, on room air CV: Tachycardic, regular rhythm w/o M/G/R GI: abd soft, NTND, + BS MSK: no peripheral edema, moves all extremities independently NEURO: CN II-XII intact, no focal deficits other than his persistent confusion PSYCH: normal mood/affect Integumentary: dry/intact, no rashes or wounds    Data Reviewed: I have personally reviewed following labs and imaging studies  CBC: Recent Labs  Lab 05/24/23 0251 05/26/23 0357 05/30/23 0037  WBC 9.9 7.2 13.0*  NEUTROABS 7.1  --   --   HGB 12.6* 11.6* 12.4*  HCT 39.4 36.3* 38.9*  MCV 87.9 88.5 87.4  PLT 486* 431* 358   Basic Metabolic Panel: Recent  Labs  Lab 05/24/23 0251 05/26/23 0357 05/30/23 0037  NA 134* 138 133*  K 3.7 4.2 4.6  CL 98 105 99  CO2 25 26 24   GLUCOSE 108* 110* 123*  BUN <5* <5* <5*  CREATININE 0.62 0.56* 0.73  CALCIUM 9.2 9.1 9.1  MG  --   --  2.0   GFR: CrCl cannot be calculated (Unknown ideal weight.). Liver Function Tests: Recent Labs  Lab 05/24/23 0251  AST 18  ALT 12  ALKPHOS 151*  BILITOT 0.5  PROT 6.9  ALBUMIN 2.7*   No results for input(s): "LIPASE", "AMYLASE" in the last 168 hours. Recent Labs  Lab 05/24/23 0251  AMMONIA <10   Coagulation Profile: No results for input(s): "INR", "PROTIME" in the last 168 hours. Cardiac Enzymes: No results for input(s): "CKTOTAL", "CKMB", "CKMBINDEX", "TROPONINI" in the last 168 hours. BNP (last 3 results) No results for input(s): "PROBNP" in the last 8760 hours. HbA1C: No results for input(s): "HGBA1C" in the last 72 hours.  CBG: Recent Labs  Lab 05/29/23 1150 05/29/23 1600 05/29/23 1752 05/30/23 0023 05/30/23 0619  GLUCAP 155* 137* 124* 145* 103*   Lipid Profile: No results for input(s): "CHOL", "HDL", "LDLCALC", "TRIG", "CHOLHDL", "LDLDIRECT" in the last 72 hours. Thyroid Function Tests: No results for input(s): "TSH", "T4TOTAL", "FREET4", "T3FREE", "THYROIDAB" in the last 72 hours.  Anemia Panel: Recent Labs    05/28/23 1230  VITAMINB12 658  Sepsis Labs: No results for input(s): "PROCALCITON", "LATICACIDVEN" in the last 168 hours.  Recent Results (from the past 240 hour(s))  Culture, blood (Routine X 2) w Reflex to ID Panel     Status: None   Collection Time: 05/24/23  2:49 AM   Specimen: BLOOD RIGHT HAND  Result Value Ref Range Status   Specimen Description BLOOD RIGHT HAND  Final   Special Requests   Final    BOTTLES DRAWN AEROBIC AND ANAEROBIC Blood Culture results may not be optimal due to an inadequate volume of blood received in culture bottles   Culture   Final    NO GROWTH 5 DAYS Performed at [facility], [address], [place], [place] [address]    Report Status 05/29/2023 FINAL  Final  Culture, blood (Routine X 2) w Reflex to ID Panel     Status: None   Collection Time: 05/24/23  2:51 AM   Specimen: BLOOD  Result Value Ref Range Status   Specimen Description BLOOD RIGHT ANTECUBITAL  Final   Special Requests   Final    BOTTLES DRAWN AEROBIC AND ANAEROBIC Blood Culture results may not be optimal due to an inadequate volume of blood received in culture bottles   Culture   Final    NO GROWTH 5 DAYS Performed at [facility], [address], [place], [place] [address]    Report Status 05/29/2023 FINAL  Final         Radiology Studies: No results found.      Scheduled Meds:  amLODipine  5 mg Oral Daily   aspirin EC  81 mg Oral Daily   clopidogrel  75 mg Oral Daily   heparin  5,000 Units Subcutaneous Q8H   insulin aspart  0-9 Units Subcutaneous Q6H   levETIRAcetam  1,000 mg Oral BID   metoprolol tartrate  50 mg Oral BID   mometasone-formoterol  2 puff Inhalation BID   nicotine  21 mg Transdermal Daily   rosuvastatin  40 mg Oral Daily   sodium chloride flush  10-40 mL Intracatheter Q12H   tamsulosin  0.4 mg Oral Daily   [START ON 06/01/2023] thiamine (VITAMIN B1) injection  100 mg Intravenous Daily   Continuous Infusions:  thiamine (VITAMIN B1) injection 250 mg (05/29/23 1011)     LOS: 7 days    Time spent: 48 minutes spent on chart review, discussion with nursing staff, consultants, updating family and interview/physical exam; more than 50% of that time was spent in counseling and/or coordination of care.    Alvira Philips [place], DO [facility] Available via Epic secure chat 7am-7pm After these hours, please refer to coverage provider listed on [email or web address] 05/30/2023, 10:15 AM

## 2023-05-30 NOTE — Progress Notes (Signed)
   05/30/23 1400  Spiritual Encounters  Type of Visit Initial  Care provided to: Pt and family  Referral source Family  Reason for visit Advance directives  OnCall Visit No   Ch responded to pt's daughter request for AD. She was not present at bedside. Ch went to visit pt, and at that time, he was not alert and oriented. Ch called pt's daughter Dot Been [phone] and explained that pt needed to be alert and oriented before we can proceed with AD. [name] thought she needed to have AD in place before she can help her father make decisions. She was afraid and concerned but now she willing to wait on the AD. After Ch cleared the misunderstanding, she was relieved and expressed thanks and appreciation. Ch remains available when needed.

## 2023-05-30 NOTE — Progress Notes (Signed)
Report called to [facility]. All questions answered. Bedside RN aware.

## 2023-05-30 NOTE — TOC Transition Note (Signed)
Transition of Care [facility]) - CM/SW Discharge Note   Patient Details  Name: Brian Dudley MRN: [id number] Date of Birth: July 02, 1961  [facility]) CM/SW Contact:  [name], LCSW Phone Number: 05/30/2023, 3:49 PM   Clinical Narrative:   CSW received insurance authorization for patient, confirmed that [facility] can admit today. CSW updated MD, sent discharge information. CSW updated daughter, she is in agreement. Transport arranged with [facility] for next available.  Nurse to call report to [phone], Room 3221.    Final next level of care: Skilled Nursing Facility Barriers to Discharge: Barriers Resolved   Patient Goals and CMS Choice CMS Medicare.gov Compare Post Acute Care list provided to:: Patient Represenative (must comment) Choice offered to / list presented to : Adult Children  Discharge Placement                Patient chooses bed at: [facility] Patient to be transferred to facility by: [facility] Name of family member notified: [name] Patient and family notified of of transfer: 05/30/23  Discharge Plan and Services Additional resources added to the After Visit Summary for       Post Acute Care Choice: Skilled Nursing Facility                               Social Determinants of Health (SDOH) Interventions SDOH Screenings   Food Insecurity: Patient Unable To Answer (05/24/2023)  Housing: Patient Unable To Answer (05/24/2023)  Transportation Needs: No Transportation Needs (09/09/2019)   Received from [facility]  Utilities: Patient Unable To Answer (05/24/2023)  Financial Resource Strain: Low Risk  (09/09/2019)   Received from [facility]  Physical Activity: Inactive (11/07/2018)   Received from [facility]  Social Connections: Unknown (12/06/2022)   Received from [facility]  Stress: Stress Concern Present (11/07/2018)   Received from [facility]  Tobacco Use: High Risk  (05/24/2023)     Readmission Risk Interventions     No data to display

## 2023-05-30 NOTE — Discharge Summary (Signed)
Physician Discharge Summary  Brian Dudley WGN:[id number] DOB: 1961/01/22 DOA: 05/23/2023  PCP: Patient, No Pcp Per  Admit date: 05/23/2023 Discharge date: 05/30/2023  Admitted From: Home Disposition: SNF  Recommendations for Outpatient Follow-up:  Follow up with PCP in 1-2 weeks Please obtain BMP/CBC in one week Please follow up on the following pending results: Vitamin B1 level pending at time of discharge   Discharge Condition: Stable CODE STATUS: Full code Diet recommendation: Heart healthy/consistent carbohydrate/dysphagia 2 diet  History of present illness:  Brian Dudley is a 62 y.o. male with past medical history significant for COPD, CVA, HTN, BPH, stage IV prostate cancer, history of stage III rectosigmoid cancer, recent CVA approximately 8 weeks ago with recent left carotid endarterectomy who presented to [facility] as a transfer from [facility] hospital in [place] with concern of seizures.  Following his endarterectomy, patient was doing well postoperatively but 2 days later he developed altered mentation.  He Brian Dudley presented to [facility] with MRI brain, CTA head/neck with no acute findings.  Teleneurology was consulted given concern patient was having focal hemispheric seizures as EEG was showing left-sided slowing but no LF deform discharges.  He was noted to have some episodes of right arm shaking was started on Keppra 1000 mg twice daily for seizures.  Patient had repeat MRI and EEG that was unrevealing; although patient was confused and persisted.  [facility] vascular surgeon discussed with neurology who recommended transfer to [facility] as their facility does not have an in-house neurologist or continuous EEG available.  Patient was unable to give any further history due to his confusion.   Hospital course:  Seizure Patient presented to [facility] hospital with confusion, aphasia and intermittent right  hand jerking.  Recent CVA and underwent a left carotid endarterectomy and symptoms onset 2 days following procedure.  Imaging at OSH with MRI and routine EEG were unrevealing.  Was started on Keppra 1000 mg BID and transferred to [facility] for in person neurology evaluation with continuous EEG. EEG with spikes left posterior quadrant, continuous slow left hemisphere; no seizure or left form discharges noted.  MR brain without contrast with no acute intracranial abnormality, noted encephalopathy left cerebral hemisphere consistent with prior infarct.  Vitamin B12 within normal limits, RPR nonreactive.  Neurology was consulted and followed during hospital course.  Recommend continue Keppra 1000 mg p.o. twice daily.  Follow-up vitamin B1 level that was pending at time of discharge.  Outpatient follow-up with neurology as needed.   Recent CVA Hx carotid artery stenosis s/p left carotid endarterectomy Continue Plavix 75 mg p.o. daily, Aspirin 81 mg p.o. daily, Crestor 40 mg p.o. daily   Atrial tachycardia Metoprolol tartrate 50 mg p.o. twice daily   Essential hypertension Metoprolol tartrate 50 mg p.o. twice daily, continue aspirin and statin   Hyperlipidemia Crestor 40 mg p.o. daily   BPH Continue tamsulosin 0.4 mg p.o. daily. Foley catheter removed 8/15, previously placed at OSH; no issues with obstruction since removal.   Prediabetes Hemoglobin A1c 6.3.  Outpatient follow-up with PCP.   COPD Not oxygen dependent.  Continue Trelegy Ellipta and montelukast   History of prostate/rectosigmoid carcinoma Continue outpatient follow-up with [facility] oncology   Tobacco use disorder Counseled on regarding abstinence   Weakness/debility/deconditioning: Discharging to SNF for further rehabilitation.  Discharge Diagnoses:  Principal Problem:   Altered mental status Active Problems:   COPD (chronic obstructive pulmonary disease) (HCC)   Tobacco abuse   HLD (hyperlipidemia)  BPH (benign  prostatic hyperplasia)   Prostate cancer (HCC)   History of malignant neoplasm of rectosigmoid junction    Discharge Instructions  Discharge Instructions     Call MD for:  difficulty breathing, headache or visual disturbances   Complete by: As directed    Call MD for:  extreme fatigue   Complete by: As directed    Call MD for:  persistant dizziness or light-headedness   Complete by: As directed    Call MD for:  persistant nausea and vomiting   Complete by: As directed    Call MD for:  severe uncontrolled pain   Complete by: As directed    Call MD for:  temperature >100.4   Complete by: As directed    Diet - low sodium heart healthy   Complete by: As directed    Increase activity slowly   Complete by: As directed       Allergies as of 05/30/2023   No Known Allergies      Medication List     STOP taking these medications    atorvastatin 20 MG tablet Commonly known as: LIPITOR   ciprofloxacin 500 MG tablet Commonly known as: CIPRO   fluticasone-salmeterol 250-50 MCG/ACT Aepb Commonly known as: ADVAIR   lactobacillus acidophilus & bulgar chewable tablet   metroNIDAZOLE 500 MG tablet Commonly known as: FLAGYL   potassium chloride 10 MEQ tablet Commonly known as: KLOR-CON       TAKE these medications    acetaminophen 325 MG tablet Commonly known as: TYLENOL Take 2 tablets (650 mg total) by mouth every 6 (six) hours as needed for mild pain (or Fever >/= 101).   amLODipine 5 MG tablet Commonly known as: NORVASC Take 5 mg by mouth at bedtime.   aspirin EC 81 MG tablet Take 1 tablet (81 mg total) by mouth daily. Swallow whole. Start taking on: May 31, 2023   clopidogrel 75 MG tablet Commonly known as: PLAVIX Take 75 mg by mouth daily.   folic acid 1 MG tablet Commonly known as: FOLVITE Take 1 tablet (1 mg total) by mouth daily.   levETIRAcetam 1000 MG tablet Commonly known as: KEPPRA Take 1 tablet (1,000 mg total) by mouth 2 (two) times  daily.   metoprolol tartrate 50 MG tablet Commonly known as: LOPRESSOR Take 1 tablet (50 mg total) by mouth 2 (two) times daily.   montelukast 10 MG tablet Commonly known as: SINGULAIR Take 10 mg by mouth at bedtime.   multivitamin with minerals Tabs tablet Take 1 tablet by mouth daily.   rosuvastatin 20 MG tablet Commonly known as: CRESTOR Take 20 mg by mouth at bedtime.   tamsulosin 0.4 MG Caps capsule Commonly known as: FLOMAX Take 0.4 mg by mouth daily.   thiamine 100 MG tablet Commonly known as: VITAMIN B1 Take 1 tablet (100 mg total) by mouth daily.   Trelegy Ellipta 100-62.5-25 MCG/ACT Aepb Generic drug: Fluticasone-Umeclidin-Vilant Inhale into the lungs.        No Known Allergies  Consultations: Neurology   Procedures/Studies: MR BRAIN W WO CONTRAST  Result Date: 05/28/2023 CLINICAL DATA:  Initial evaluation for TIA, seizure. EXAM: MRI HEAD WITHOUT AND WITH CONTRAST TECHNIQUE: Multiplanar, multiecho pulse sequences of the brain and surrounding structures were obtained without and with intravenous contrast. CONTRAST:  6mL GADAVIST GADOBUTROL 1 MMOL/ML IV SOLN COMPARISON:  None Available. FINDINGS: Brain: Examination degraded by motion artifact. Mild age-related cerebral atrophy. Patchy T2/FLAIR hyperintensity involving the periventricular deep white matter both cerebral hemispheres  as well as the pons, consistent with chronic small vessel ischemic disease. Encephalomalacia and gliosis involving the left cerebral hemisphere, consistent with prior ischemic infarcts, watershed in distribution. No convincing abnormal foci of restricted diffusion to suggest acute or subacute ischemia. Gray-white matter differentiation maintained. No acute or chronic intracranial blood products. No mass lesion within the brain itself. No mass effect or midline shift. No hydrocephalus or extra-axial fluid collection. Pituitary gland and suprasellar region within normal limits. 7 mm focus of  enhancement with an adjacent prominent vessel noted at the posterior left frontoparietal region (series 11, image 42). No significant surrounding mass effect or edema. Finding is favored to be vascular in nature, possibly due to the underlying chronic ischemic changes. No other abnormal parenchymal enhancement. Vascular: Major intracranial vascular flow voids are maintained. Skull and upper cervical spine: Craniocervical junction within normal limits. There is an abnormal T1 hypointense, enhancing lesion within the right lateral mass of T1 (series 7, image 16), indeterminate, but could reflect a small osseous metastasis. Bone marrow signal intensity otherwise within normal limits. No scalp soft tissue abnormality. Sinuses/Orbits: Globes and orbital soft tissues within normal limits. Mild scattered mucosal thickening present about the ethmoidal air cells and maxillary sinuses. Paranasal sinuses are otherwise largely clear. No mastoid effusion. Other: 2.3 cm well-circumscribed cystic lesion within the subcutaneous fat of the left suboccipital region, likely a sebaceous cyst. IMPRESSION: 1. No acute intracranial abnormality. 2. Encephalomalacia and gliosis involving the left cerebral hemisphere, consistent with prior ischemic infarcts, watershed in distribution. 3. 7 mm focus of enhancement with an adjacent prominent vessel at the posterior left frontoparietal region, favored to be vascular in nature, possibly due to the underlying chronic ischemic changes. Attention at follow-up recommended. 4. Abnormal T1 hypointense, enhancing lesion within the right lateral mass of T1, indeterminate, but could reflect an osseous metastasis. 5. Underlying age-related cerebral atrophy with chronic small vessel ischemic disease. Electronically Signed   By: Rise Mu M.D.   On: 05/28/2023 00:32   DG CHEST PORT 1 VIEW  Result Date: 05/24/2023 CLINICAL DATA:  Altered mental status EXAM: PORTABLE CHEST 1 VIEW COMPARISON:   None Available. FINDINGS: No pleural effusion. No pneumothorax. Normal cardiac and mediastinal contours. There are prominent bilateral opacities with a possible more focal airspace opacity in the right lung base. No radiographically apparent displaced rib fractures. Visualized upper abdomen is unremarkable. IMPRESSION: Likely background of chronic lung disease with a superimposed airspace opacity at the right lung base that could represent atelectasis or infection. Electronically Signed   By: Lorenza Cambridge M.D.   On: 05/24/2023 08:27   Overnight EEG with video  Result Date: 05/24/2023 Charlsie Quest, MD     05/25/2023  9:41 AM Patient Name: Zahki Cypert MRN: [id number] Epilepsy Attending: Charlsie Quest Referring Physician/Provider: Erick Blinks, MD Duration: 05/24/2023 0406 to 05/25/2023 0406  Patient history: 62 y.o. male with PMH significant for EtOh use, COPD, prior L frontoparietal stroke, HTN, who was transferred from outside hospital for concern for focal L hemispheric seizures after L carotid endarterectomy. EEG to evaluate for seizure.  Level of alertness: Awake, asleep  AEDs during EEG study: LEV  Technical aspects: This EEG study was done with scalp electrodes positioned according to the 10-20 International system of electrode placement. Electrical activity was reviewed with band pass filter of 1-70Hz , sensitivity of 7 uV/mm, display speed of 34mm/sec with a 60Hz  notched filter applied as appropriate. EEG data were recorded continuously and digitally stored.  Video monitoring was available and  reviewed as appropriate.  Description: The posterior dominant rhythm consists of 8 Hz activity of moderate voltage (25-35 uV) seen predominantly in posterior head regions, asymmetric ( left<right) and reactive to eye opening and eye closing. Sleep was characterized by sleep spindles (12 to 15 Hz), maximal frontocentral region. EEG showed continuous 3 to 5 Hz theta-delta slowing in left hemisphere.  Frequent spikes were noted in left posterior quadrant, qasi -periodic during sleep at 0.25-1Hz .  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  Additionally there was cortical dysfunction arising from left hemisphere likely secondary to underlying stroke.  No seizures were seen during this time.  Priyanka [name]     Subjective: Patient seen examined bedside, resting calmly.  Lying in bed.  Remains confused.  Discharging to SNF today.  Denies headache, no dizziness, no chest pain, no palpitations, no shortness of breath, no abdominal pain,.  No acute events overnight per nurse staff.  Discharge Exam: Vitals:   05/30/23 0827 05/30/23 1148  BP: 132/80 108/68  Pulse: (!) 110 (!) 110  Resp: 20 19  Temp: 98.2 F (36.8 C) 98.7 F (37.1 C)  SpO2: 94% 97%   Vitals:   05/30/23 0013 05/30/23 0428 05/30/23 0827 05/30/23 1148  BP: 126/80 (!) 149/84 132/80 108/68  Pulse: (!) 108 (!) 114 (!) 110 (!) 110  Resp: 20 19 20 19   Temp: 99.1 F (37.3 C) 98.8 F (37.1 C) 98.2 F (36.8 C) 98.7 F (37.1 C)  TempSrc: Oral Oral Oral Oral  SpO2: 96% 98% 94% 97%  Height:        Physical Exam: GEN: NAD, alert, oriented self and place ([place]) but not time (1961)/situation, person (president: "I gave up on him"), poor dentition, chronically ill in appearance HEENT: NCAT, PERRL, EOMI, sclera clear, MMM PULM: CTAB w/o wheezes/crackles, normal respiratory effort, on room air CV: Tachycardic, regular rhythm w/o M/G/R GI: abd soft, NTND, + BS MSK: no peripheral edema, moves all extremities independently NEURO: CN II-XII intact, no focal deficits other than his persistent confusion PSYCH: normal mood/affect Integumentary: dry/intact, no rashes or wounds    The results of significant diagnostics from this hospitalization (including imaging,  microbiology, ancillary and laboratory) are listed below for reference.     Microbiology: Recent Results (from the past 240 hour(s))  Culture, blood (Routine X 2) w Reflex to ID Panel     Status: None   Collection Time: 05/24/23  2:49 AM   Specimen: BLOOD RIGHT HAND  Result Value Ref Range Status   Specimen Description BLOOD RIGHT HAND  Final   Special Requests   Final    BOTTLES DRAWN AEROBIC AND ANAEROBIC Blood Culture results may not be optimal due to an inadequate volume of blood received in culture bottles   Culture   Final    NO GROWTH 5 DAYS Performed at [facility], [address] [place], [place] [address]    Report Status 05/29/2023 FINAL  Final  Culture, blood (Routine X 2) w Reflex to ID Panel     Status: None   Collection Time: 05/24/23  2:51 AM   Specimen: BLOOD  Result Value Ref Range Status   Specimen Description BLOOD RIGHT ANTECUBITAL  Final   Special Requests   Final    BOTTLES DRAWN AEROBIC AND ANAEROBIC Blood Culture results may not be optimal due to an inadequate volume of blood  received in culture bottles   Culture   Final    NO GROWTH 5 DAYS Performed at [facility], [address] [place], [place] [address]    Report Status 05/29/2023 FINAL  Final     Labs: BNP (last 3 results) No results for input(s): "BNP" in the last 8760 hours. Basic Metabolic Panel: Recent Labs  Lab 05/24/23 0251 05/26/23 0357 05/30/23 0037  NA 134* 138 133*  K 3.7 4.2 4.6  CL 98 105 99  CO2 25 26 24   GLUCOSE 108* 110* 123*  BUN <5* <5* <5*  CREATININE 0.62 0.56* 0.73  CALCIUM 9.2 9.1 9.1  MG  --   --  2.0   Liver Function Tests: Recent Labs  Lab 05/24/23 0251  AST 18  ALT 12  ALKPHOS 151*  BILITOT 0.5  PROT 6.9  ALBUMIN 2.7*   No results for input(s): "LIPASE", "AMYLASE" in the last 168 hours. Recent Labs  Lab 05/24/23 0251  AMMONIA <10   CBC: Recent Labs  Lab 05/24/23 0251 05/26/23 0357 05/30/23 0037  WBC 9.9 7.2 13.0*  NEUTROABS  7.1  --   --   HGB 12.6* 11.6* 12.4*  HCT 39.4 36.3* 38.9*  MCV 87.9 88.5 87.4  PLT 486* 431* 358   Cardiac Enzymes: No results for input(s): "CKTOTAL", "CKMB", "CKMBINDEX", "TROPONINI" in the last 168 hours. BNP: Invalid input(s): "POCBNP" CBG: Recent Labs  Lab 05/29/23 1600 05/29/23 1752 05/30/23 0023 05/30/23 0619 05/30/23 1150  GLUCAP 137* 124* 145* 103* 119*   D-Dimer No results for input(s): "DDIMER" in the last 72 hours. Hgb A1c No results for input(s): "HGBA1C" in the last 72 hours. Lipid Profile No results for input(s): "CHOL", "HDL", "LDLCALC", "TRIG", "CHOLHDL", "LDLDIRECT" in the last 72 hours. Thyroid function studies No results for input(s): "TSH", "T4TOTAL", "T3FREE", "THYROIDAB" in the last 72 hours.  Invalid input(s): "FREET3" Anemia work up Recent Labs    05/28/23 1230  VITAMINB12 658   Urinalysis    Component Value Date/Time   COLORURINE YELLOW 05/24/2023 0315   APPEARANCEUR CLEAR 05/24/2023 0315   LABSPEC 1.012 05/24/2023 0315   PHURINE 6.0 05/24/2023 0315   GLUCOSEU NEGATIVE 05/24/2023 0315   HGBUR LARGE (A) 05/24/2023 0315   BILIRUBINUR NEGATIVE 05/24/2023 0315   KETONESUR NEGATIVE 05/24/2023 0315   PROTEINUR 30 (A) 05/24/2023 0315   NITRITE NEGATIVE 05/24/2023 0315   LEUKOCYTESUR MODERATE (A) 05/24/2023 0315   Sepsis Labs Recent Labs  Lab 05/24/23 0251 05/26/23 0357 05/30/23 0037  WBC 9.9 7.2 13.0*   Microbiology Recent Results (from the past 240 hour(s))  Culture, blood (Routine X 2) w Reflex to ID Panel     Status: None   Collection Time: 05/24/23  2:49 AM   Specimen: BLOOD RIGHT HAND  Result Value Ref Range Status   Specimen Description BLOOD RIGHT HAND  Final   Special Requests   Final    BOTTLES DRAWN AEROBIC AND ANAEROBIC Blood Culture results may not be optimal due to an inadequate volume of blood received in culture bottles   Culture   Final    NO GROWTH 5 DAYS Performed at [facility], [address],  [place], [place] [address]    Report Status 05/29/2023 FINAL  Final  Culture, blood (Routine X 2) w Reflex to ID Panel     Status: None   Collection Time: 05/24/23  2:51 AM   Specimen: BLOOD  Result Value Ref Range Status   Specimen Description BLOOD RIGHT  ANTECUBITAL  Final   Special Requests   Final    BOTTLES DRAWN AEROBIC AND ANAEROBIC Blood Culture results may not be optimal due to an inadequate volume of blood received in culture bottles   Culture   Final    NO GROWTH 5 DAYS Performed at [facility], [address], [place], [place] [address]    Report Status 05/29/2023 FINAL  Final     Time coordinating discharge: Over 30 minutes  SIGNED:   Alvira Philips [place], DO  [facility] 05/30/2023, 3:07 PM

## 2023-05-30 NOTE — Plan of Care (Signed)
  Problem: Activity: Goal: Risk for activity intolerance will decrease Outcome: Progressing   Problem: Nutrition: Goal: Adequate nutrition will be maintained Outcome: Progressing   Problem: Coping: Goal: Level of anxiety will decrease Outcome: Progressing   Problem: Safety: Goal: Ability to remain free from injury will improve Outcome: Progressing   Problem: Skin Integrity: Goal: Risk for impaired skin integrity will decrease Outcome: Progressing   

## 2023-05-30 NOTE — Progress Notes (Signed)
Speech Language Pathology Treatment: Dysphagia  Patient Details Name: Brian Dudley MRN: [id number] DOB: 1961/10/07 Today's Date: 05/30/2023 Time: 1320-1330 SLP Time Calculation (min) (ACUTE ONLY): 10 min  Assessment / Plan / Recommendation Clinical Impression  RN reports that pt is tolerating dys 2 diet as long as staff is present to remind him to take bites and swallow. He is not pocketing if this level of assist is given and RN feel dys 2 texture is appropriate. SLP visit pt who was drowsy at the time. Pt able to take sips, still with baseline congested cough observed (as documented in MBS) and prolonged mastication. Pt likely to continue to need modified diet and assistance into next level of care. Will sign off acutely for dysphagia.   HPI HPI: Brian Dudley is a 62 y.o. male with PMH significant for EtOh use, COPD, prior L frontoparietal stroke, HTN, who was transferred from outside hospital for concern for focal L hemispheric seizures after L carotid endarterectomy. He was initially at his baseline post op, then noted to have worsening R sided weakness, aphasia and confusion with intermittent R hand jerking. Imaging at OSH with MRIs and routine EEGs were non revealing per transferring MD but the images are not available for our review. He was started on Keppra 1000mg  BID and transferred for [facility] neurology evaluation and cEEG. LTM x48 hrs since transfer has showed spikes in the left posterior quadrant but no seizures.      SLP Plan  Continue with current plan of care      Recommendations for follow up therapy are one component of a multi-disciplinary discharge planning process, led by the attending physician.  Recommendations may be updated based on patient status, additional functional criteria and insurance authorization.    Recommendations  Diet recommendations: Thin liquid;Dysphagia 2 (fine chop) Liquids provided via: Cup;Straw Medication Administration: Whole meds with  puree Supervision: Full supervision/cueing for compensatory strategies;Staff to assist with self feeding Compensations: Slow rate;Small sips/bites;Clear throat intermittently Postural Changes and/or Swallow Maneuvers: Seated upright 90 degrees                        Dysphagia, oropharyngeal phase (R13.12)     Continue with current plan of care     Brian Dudley, Riley Nearing  05/30/2023, 1:39 PM

## 2023-05-30 NOTE — TOC Progression Note (Signed)
Transition of Care [facility]) - Progression Note    Patient Details  Name: Brian Dudley MRN: [id number] Date of Birth: 06-12-1961  [facility] CM/SW Contact  [name], [place] Phone Number: 05/30/2023, 1:46 PM  Clinical Narrative:   CSW updated by CMA that insurance is asking for a clarification about patient's previous living environment and function. CSW contacted daughter to clarify. Patient previously from home alone and fully independent; had walked himself in to an outpatient procedure and then had complications that led to this episode. Patient is drastically different now than previous. CSW relayed information to CMA who will provide update to insurance for authorization. CSW to follow.    Expected Discharge Plan: Skilled Nursing Facility Barriers to Discharge: Continued Medical Work up, English as a second language teacher  Expected Discharge Plan and Services     Post Acute Care Choice: Skilled Nursing Facility Living arrangements for the past 2 months: Single Family Home                                       Social Determinants of Health (SDOH) Interventions SDOH Screenings   Food Insecurity: Patient Unable To Answer (05/24/2023)  Housing: Patient Unable To Answer (05/24/2023)  Transportation Needs: No Transportation Needs (09/09/2019)   Received from [facility]  Utilities: Patient Unable To Answer (05/24/2023)  Financial Resource Strain: Low Risk  (09/09/2019)   Received from [facility]  Physical Activity: Inactive (11/07/2018)   Received from [facility]  Social Connections: Unknown (12/06/2022)   Received from [facility]  Stress: Stress Concern Present (11/07/2018)   Received from [facility]  Tobacco Use: High Risk (05/24/2023)    Readmission Risk Interventions     No data to display

## 2023-05-31 LAB — VITAMIN B1: Vitamin B1 (Thiamine): 352.9 nmol/L — ABNORMAL HIGH (ref 66.5–200.0)

## 2023-06-07 ENCOUNTER — Encounter: Payer: Self-pay | Admitting: Neurology

## 2023-06-19 NOTE — Progress Notes (Signed)
RN left message with patient/patient's daughter to discuss referral due to location.  Patient is currently in a SNF for rehab, and unsure of discharge location.

## 2023-06-20 ENCOUNTER — Telehealth: Payer: Self-pay | Admitting: Oncology

## 2023-06-20 DIAGNOSIS — C7951 Secondary malignant neoplasm of bone: Secondary | ICD-10-CM | POA: Insufficient documentation

## 2023-06-20 NOTE — Assessment & Plan Note (Addendum)
Stage IIIC, pT3, pN2b, cM0, s/p resection with negative margins (1mm on Radial margin), 1cm prevascular LN, indeterminate  05/2019 Status post adjuvant FOLFOX x 12. 04/2021 Follow-up colonoscopy demonstrated no signs of recurrent cancer  02/13/23 CEA 1.7

## 2023-06-20 NOTE — Progress Notes (Signed)
RN left message with medical records at the [facility] to obtain copy of NGS testing for upcoming appointment on 9/12.

## 2023-06-20 NOTE — Telephone Encounter (Signed)
Patient contacted for NP appointment with medical oncology, telephone given to daughter, [name] for scheduling.  Appt date/time/location/what to bring/what to expect to see Dr. Cherly Hensen confirmed with [name] who will be bringing patient.  No questions at this time.  Referral IB sent back to [facility] advising of this appt.

## 2023-06-20 NOTE — Progress Notes (Unsigned)
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 04/2021 Follow-up colonoscopy demonstrated no signs of recurrent cancer  02/13/23 CEA 1.7  Prostate cancer [facility]) Initial diagnosis: mHSPC 05/2020 Gleason score 9 Abdominal and pelvic lymphadenopathy Treatment: 07/16/2020 starteed Lupron 12/2020 completed 5500 cGy to the prostate Progression in 2023 04/21/22 Xtandi with ADT Progression in 2024. PSA 35 in 04/2023 06/2023 first visit to [facility] oncology  62 y.o.m with mCRPC post ADT/ARPI. PET scan reported multiple FDG avid lesions from June while PSA elevation is modest. CEA is not elevated with h/o high risk rectal cancer. Recommend proceed with CT guided biopsy asap for diagnosis to rule our aggressive variant vs transformation.  Biopsy New baseline PET PSA, testosterone, CMP, CBC, LDH Referral for genetic testing Will obtain outside record of NGS     No orders of the defined types were placed in this encounter.   Supportive baseline bone mineral density study and then every 2 years calcium (1000-1200 mg daily from food and supplements) and vitamin D3 (1000 IU daily) Zometa (5 mg IV annually) for osteopenia (T-score between -1.0 and -2.5) on ADT after dental clearance. If CRPC, 4 mg every 3 months if having bone metastases. Control and prevent diabetes Weight-bearing exercises (30 minutes per day) Limit alcohol consumption and avoid smoking  The total time spent in the appointment was {CHL ONC TIME VISIT - [id number]} encounter with patients including review of chart and various tests results, discussions about plan of  care and coordination of care plan  All questions were answered. The patient knows to call the clinic with any problems, questions or concerns. No barriers to learning was detected.  Brian Sartorius, MD 9/11/202410:39 PM  CHIEF COMPLAINTS/PURPOSE OF CONSULTATION:  ***  HISTORY OF PRESENTING ILLNESS:  Brian Dudley 62 y.o. male is here because of prostate cancer. Brian Dudley is a 62 y.o. male with past medical history significant for COPD, CVA, HTN, BPH, stage IV prostate cancer, history of stage III rectosigmoid cancer, CVA with left carotid endarterectomy.  He was admitted to the hospital in August 2020 due to seizure.   I have reviewed his chart and materials related to his cancer extensively and collaborated history with the patient. Summary of oncologic history is as follows: Oncology History Overview Note  Initial diagnosis: 04/15/2020 PSA 150.24  05/2020 Abdominal and pelvic lymphadenopathy present at diagnosis. 05/28/20 Biopsy of the prostate demonstrated 8 of 8 cores positive with a Gleason score of 4+5= 9.  Gleason score 9  07/16/2020 starteed Lupron every 3 months   11/2020 Pylarify PET CT scattered hypermetabolic lymph nodes in the axilla retroperitoneal lymph nodes, and pelvic lymph nodes have resolved. New small hypermetabolic paratracheal esophageal lymph node 7 mm to be monitored.   09/2020 PSA 0.96 11/24/20 5500 cGy to the prostate completed January 05, 2021   12/09/2020 PSA 0.80 03/25/2021 PSA 0.28  01/31/2022 PSA 8.49   03/2022 PET showed evidence of early skeletal metastatic disease identified with lesions in the L3 vertebral body and the spinous process of the left scapula. Left perirectal, right obturator, and right retrocrural lymph nodes demonstrating increased tracer avidity, suspected to represent metastatic disease   04/18/2022  PSA 78.27  04/21/22 Xtandi 160 mg started. 07/22/2022 PSA 0.7 10/17/2022. PSA 1.44 02/2023 PSA 13.43 03/16/23 PSA 27.42 04/07/23 FDG PET 1.   Progressive hypermetabolic lymphadenopathy within the neck and chest as described above.  Neck maximum SUV value of 25.8.  1 cm left paratracheal lymph node demonstrating maximum SUV value of 45.0  Anterior mediastinal lymph node measuring 2.1 cm in diameter demonstrating maximum SUV value of 66.7 international units appears progressive compared to previous study (previously measured 1 cm in diameter with a maximum SUV value of 39.2 international units  ABDOMEN/PELVIS:Hypermetabolic right obturator lymph node measures 1.2 cm and demonstrates a maximum SUV value of 10.2 international units (unchanged in size with previous maximum SUV value 8.3 international units).  No discrete evidence of tracer avid disease within the prostate gland.   2.  Progressive hypermetabolic osseous metastatic disease.  Previous index hypermetabolic focus involving the L3 vertebral body probably demonstrates maximum SUV value of 77.6 international units (previous maximum SUV value 42.4 international units).  Focus of uptake involving the T4 vertebral body with maximum SUV value of 83.2 international units is noted (previous maximum SUV value 86.3 international units).  Previous focus of FDG uptake involving the odontoid process probably demonstrates a maximum SUV value of 113 international units (previous maximum SUV value of 55.3 international units)  New hypermetabolic focus involving the right aspect of the frontal bone is identified with a maximum SUV value of 15.5 international units.  New focus of FDG uptake within the C6 vertebral body with maximum SUV value of 71.8 international units   04/2023 NGS   04/12/23 PSA 35.38  04/21/23 last visit at [facility].    Prostate cancer (HCC)  05/24/2023 Initial Diagnosis   Prostate cancer [facility])     MEDICAL HISTORY:  Past Medical History:  Diagnosis Date   BPH (benign prostatic hyperplasia)    COPD with emphysema (HCC)    CVA (cerebral vascular accident) (HCC)     Diverticulitis    History of alcohol abuse    History of colon cancer    History of tobacco abuse    Hypertension    Prostate cancer (HCC)     SURGICAL HISTORY: Past Surgical History:  Procedure Laterality Date   CAROTID ENDARTERECTOMY Left 05/11/2023   TONSILLECTOMY      SOCIAL HISTORY: Social History   Socioeconomic History   Marital status: Single    Spouse name: Not on file   Number of children: Not on file   Years of education: Not on file   Highest education level: Not on file  Occupational History   Not on file  Tobacco Use   Smoking status: Every Day    Types: Cigarettes   Smokeless tobacco: Never   Tobacco comments:    CUTTING BACK TO 8 TO 10 CIGS A DAY  Vaping Use   Vaping status: Never Used  Substance and Sexual Activity   Alcohol use: Yes   Drug use: Not Currently   Sexual activity: Not on file  Other Topics Concern   Not on file  Social History Narrative   Not on file   Social Determinants of Health   Financial Resource Strain: Low Risk  (09/09/2019)   Received from [facility]   Overall Financial Resource Strain (CARDIA)    Difficulty of Paying Living Expenses: Not very hard  Food Insecurity: Patient Unable To Answer (05/24/2023)   Hunger Vital Sign    Worried About Running Out of Food in the  Last Year: Patient unable to answer    Ran Out of Food in the Last Year: Patient unable to answer  Transportation Needs: No Transportation Needs (09/09/2019)   Received from [facility]   PRAPARE - Transportation    Lack of Transportation (Medical): No    Lack of Transportation (Non-Medical): No  Physical Activity: Inactive (11/07/2018)   Received from [facility]   Exercise Vital Sign    Days of Exercise per Week: 0 days    Minutes of Exercise per Session: 0 min  Stress: Stress Concern Present (11/07/2018)   Received from [facility] - Occupational Stress Questionnaire     Feeling of Stress : To some extent  Social Connections: Unknown (12/06/2022)   Received from [facility]   Social Network    Social Network: Not on file  Intimate Partner Violence: Unknown (12/06/2022)   Received from [facility]   HITS    Physically Hurt: Not on file    Insult or Talk Down To: Not on file    Threaten Physical Harm: Not on file    Scream or Curse: Not on file    FAMILY HISTORY: No family history on file.  ALLERGIES:  has No Known Allergies.  MEDICATIONS:  Current Outpatient Medications  Medication Sig Dispense Refill   acetaminophen (TYLENOL) 325 MG tablet Take 2 tablets (650 mg total) by mouth every 6 (six) hours as needed for mild pain (or Fever >/= 101).     amLODipine (NORVASC) 5 MG tablet Take 5 mg by mouth at bedtime.     aspirin EC 81 MG tablet Take 1 tablet (81 mg total) by mouth daily. Swallow whole.     clopidogrel (PLAVIX) 75 MG tablet Take 75 mg by mouth daily.     Fluticasone-Umeclidin-Vilant (TRELEGY ELLIPTA) 100-62.5-25 MCG/ACT AEPB Inhale into the lungs.     folic acid (FOLVITE) 1 MG tablet Take 1 tablet (1 mg total) by mouth daily. 30 tablet 0   levETIRAcetam (KEPPRA) 1000 MG tablet Take 1 tablet (1,000 mg total) by mouth 2 (two) times daily.     metoprolol tartrate (LOPRESSOR) 50 MG tablet Take 1 tablet (50 mg total) by mouth 2 (two) times daily.     montelukast (SINGULAIR) 10 MG tablet Take 10 mg by mouth at bedtime.     Multiple Vitamin (MULTIVITAMIN WITH MINERALS) TABS tablet Take 1 tablet by mouth daily. 30 tablet 0   rosuvastatin (CRESTOR) 20 MG tablet Take 20 mg by mouth at bedtime.     tamsulosin (FLOMAX) 0.4 MG CAPS capsule Take 0.4 mg by mouth daily.     thiamine 100 MG tablet Take 1 tablet (100 mg total) by mouth daily. 30 tablet 0   No current facility-administered medications for this visit.    REVIEW OF SYSTEMS:   Constitutional: Denies weight loss, decreased appetite or abnormal night sweats Respiratory: Denies cough,  shortness of breath or wheezes Cardiovascular: Denies palpitation, chest discomfort or lower extremity swelling Gastrointestinal:  Denies nausea, vomiting, diarrhea, constipation or change in bowel habits GU: Denies any urinary changes, dysuria, hematuria Skin: Denies abnormal skin rashes Lymphatics: Denies new lymphadenopathy or easy bruising Neurological:Denies numbness, tingling or new weaknesses Behavioral/Psych: Mood is stable, no new changes  All other systems were reviewed with the patient and are negative.  PHYSICAL EXAMINATION: ECOG PERFORMANCE STATUS: {CHL ONC ECOG PS:[id number]}  There were no vitals filed for this visit. There were no vitals filed for this  visit.  GENERAL: alert, no distress and comfortable SKIN: skin color is normal, no jaundice, rashes EYES: sclera clear OROPHARYNX: no exudate, no erythema NECK: supple LYMPH:  no palpable lymphadenopathy in the cervical, axillary regions LUNGS: Effort normal, no respiratory distress.  Clear to auscultation bilaterally HEART: regular rate & rhythm and no lower extremity edema ABDOMEN: soft, non-tender and nondistended Musculoskeletal: no point tenderness NEURO: no focal motor/sensory deficits  LABORATORY DATA:  I have reviewed the data as listed Lab Results  Component Value Date   WBC 13.0 (H) 05/30/2023   HGB 12.4 (L) 05/30/2023   HCT 38.9 (L) 05/30/2023   MCV 87.4 05/30/2023   PLT 358 05/30/2023   Recent Labs    05/24/23 0251 05/26/23 0357 05/30/23 0037  NA 134* 138 133*  K 3.7 4.2 4.6  CL 98 105 99  CO2 25 26 24   GLUCOSE 108* 110* 123*  BUN <5* <5* <5*  CREATININE 0.62 0.56* 0.73  CALCIUM 9.2 9.1 9.1  GFRNONAA >60 >60 >60  PROT 6.9  --   --   ALBUMIN 2.7*  --   --   AST 18  --   --   ALT 12  --   --   ALKPHOS 151*  --   --   BILITOT 0.5  --   --     RADIOGRAPHIC STUDIES: I have personally reviewed the radiological images as listed and agreed with the findings in the report. MR BRAIN W WO  CONTRAST  Result Date: 05/28/2023 CLINICAL DATA:  Initial evaluation for TIA, seizure. EXAM: MRI HEAD WITHOUT AND WITH CONTRAST TECHNIQUE: Multiplanar, multiecho pulse sequences of the brain and surrounding structures were obtained without and with intravenous contrast. CONTRAST:  6mL GADAVIST GADOBUTROL 1 MMOL/ML IV SOLN COMPARISON:  None Available. FINDINGS: Brain: Examination degraded by motion artifact. Mild age-related cerebral atrophy. Patchy T2/FLAIR hyperintensity involving the periventricular deep white matter both cerebral hemispheres as well as the pons, consistent with chronic small vessel ischemic disease. Encephalomalacia and gliosis involving the left cerebral hemisphere, consistent with prior ischemic infarcts, watershed in distribution. No convincing abnormal foci of restricted diffusion to suggest acute or subacute ischemia. Gray-white matter differentiation maintained. No acute or chronic intracranial blood products. No mass lesion within the brain itself. No mass effect or midline shift. No hydrocephalus or extra-axial fluid collection. Pituitary gland and suprasellar region within normal limits. 7 mm focus of enhancement with an adjacent prominent vessel noted at the posterior left frontoparietal region (series 11, image 42). No significant surrounding mass effect or edema. Finding is favored to be vascular in nature, possibly due to the underlying chronic ischemic changes. No other abnormal parenchymal enhancement. Vascular: Major intracranial vascular flow voids are maintained. Skull and upper cervical spine: Craniocervical junction within normal limits. There is an abnormal T1 hypointense, enhancing lesion within the right lateral mass of T1 (series 7, image 16), indeterminate, but could reflect a small osseous metastasis. Bone marrow signal intensity otherwise within normal limits. No scalp soft tissue abnormality. Sinuses/Orbits: Globes and orbital soft tissues within normal limits. Mild  scattered mucosal thickening present about the ethmoidal air cells and maxillary sinuses. Paranasal sinuses are otherwise largely clear. No mastoid effusion. Other: 2.3 cm well-circumscribed cystic lesion within the subcutaneous fat of the left suboccipital region, likely a sebaceous cyst. IMPRESSION: 1. No acute intracranial abnormality. 2. Encephalomalacia and gliosis involving the left cerebral hemisphere, consistent with prior ischemic infarcts, watershed in distribution. 3. 7 mm focus of enhancement with an adjacent prominent vessel  at the posterior left frontoparietal region, favored to be vascular in nature, possibly due to the underlying chronic ischemic changes. Attention at follow-up recommended. 4. Abnormal T1 hypointense, enhancing lesion within the right lateral mass of T1, indeterminate, but could reflect an osseous metastasis. 5. Underlying age-related cerebral atrophy with chronic small vessel ischemic disease. Electronically Signed   By: Rise Mu M.D.   On: 05/28/2023 00:32   DG CHEST PORT 1 VIEW  Result Date: 05/24/2023 CLINICAL DATA:  Altered mental status EXAM: PORTABLE CHEST 1 VIEW COMPARISON:  None Available. FINDINGS: No pleural effusion. No pneumothorax. Normal cardiac and mediastinal contours. There are prominent bilateral opacities with a possible more focal airspace opacity in the right lung base. No radiographically apparent displaced rib fractures. Visualized upper abdomen is unremarkable. IMPRESSION: Likely background of chronic lung disease with a superimposed airspace opacity at the right lung base that could represent atelectasis or infection. Electronically Signed   By: Lorenza Cambridge M.D.   On: 05/24/2023 08:27   Overnight EEG with video  Result Date: 05/24/2023 Charlsie Quest, MD     05/25/2023  9:41 AM Patient Name: Arlester Arredondo MRN: [id number] Epilepsy Attending: Charlsie Quest Referring Physician/Provider: Erick Blinks, MD Duration: 05/24/2023 0406  to 05/25/2023 0406  Patient history: 62 y.o. male with PMH significant for EtOh use, COPD, prior L frontoparietal stroke, HTN, who was transferred from outside hospital for concern for focal L hemispheric seizures after L carotid endarterectomy. EEG to evaluate for seizure.  Level of alertness: Awake, asleep  AEDs during EEG study: LEV  Technical aspects: This EEG study was done with scalp electrodes positioned according to the 10-20 International system of electrode placement. Electrical activity was reviewed with band pass filter of 1-70Hz , sensitivity of 7 uV/mm, display speed of 106mm/sec with a 60Hz  notched filter applied as appropriate. EEG data were recorded continuously and digitally stored.  Video monitoring was available and reviewed as appropriate.  Description: The posterior dominant rhythm consists of 8 Hz activity of moderate voltage (25-35 uV) seen predominantly in posterior head regions, asymmetric ( left<right) and reactive to eye opening and eye closing. Sleep was characterized by sleep spindles (12 to 15 Hz), maximal frontocentral region. EEG showed continuous 3 to 5 Hz theta-delta slowing in left hemisphere. Frequent spikes were noted in left posterior quadrant, qasi -periodic during sleep at 0.25-1Hz .  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  Additionally there was cortical dysfunction arising from left hemisphere likely secondary to underlying stroke.  No seizures were seen during this time.  Priyanka [name]

## 2023-06-20 NOTE — Assessment & Plan Note (Signed)
Initial diagnosis: mHSPC 05/2020 Gleason score 9 Abdominal and pelvic lymphadenopathy Treatment: 07/16/2020 starteed Lupron 12/2020 completed 5500 cGy to the prostate Progression in 2023 04/21/22 Xtandi with ADT Progression in 2024. PSA 35 in 04/2023 06/2023 first visit to [facility] oncology  61 y.o.m with mCRPC post ADT/ARPI. PET scan reported multiple FDG avid lesions from June while PSA elevation is modest. CEA is not elevated with h/o high risk rectal cancer. Recommend proceed with CT guided biopsy asap for diagnosis to rule our aggressive variant vs transformation.  Biopsy New baseline PET PSA, testosterone, CMP, CBC, LDH Referral for genetic testing Will obtain outside record of NGS

## 2023-06-21 DIAGNOSIS — D649 Anemia, unspecified: Secondary | ICD-10-CM | POA: Insufficient documentation

## 2023-06-22 ENCOUNTER — Inpatient Hospital Stay: Payer: 59

## 2023-06-22 ENCOUNTER — Other Ambulatory Visit: Payer: Self-pay

## 2023-06-22 VITALS — BP 106/65 | HR 81 | Temp 97.0°F | Resp 16 | Wt 137.8 lb

## 2023-06-22 DIAGNOSIS — F17210 Nicotine dependence, cigarettes, uncomplicated: Secondary | ICD-10-CM | POA: Diagnosis not present

## 2023-06-22 DIAGNOSIS — C61 Malignant neoplasm of prostate: Secondary | ICD-10-CM

## 2023-06-22 DIAGNOSIS — Z79899 Other long term (current) drug therapy: Secondary | ICD-10-CM | POA: Insufficient documentation

## 2023-06-22 DIAGNOSIS — Z9221 Personal history of antineoplastic chemotherapy: Secondary | ICD-10-CM | POA: Insufficient documentation

## 2023-06-22 DIAGNOSIS — Z8673 Personal history of transient ischemic attack (TIA), and cerebral infarction without residual deficits: Secondary | ICD-10-CM | POA: Insufficient documentation

## 2023-06-22 DIAGNOSIS — I10 Essential (primary) hypertension: Secondary | ICD-10-CM | POA: Insufficient documentation

## 2023-06-22 DIAGNOSIS — D649 Anemia, unspecified: Secondary | ICD-10-CM

## 2023-06-22 DIAGNOSIS — R9089 Other abnormal findings on diagnostic imaging of central nervous system: Secondary | ICD-10-CM

## 2023-06-22 DIAGNOSIS — C7951 Secondary malignant neoplasm of bone: Secondary | ICD-10-CM | POA: Diagnosis not present

## 2023-06-22 DIAGNOSIS — C778 Secondary and unspecified malignant neoplasm of lymph nodes of multiple regions: Secondary | ICD-10-CM | POA: Diagnosis not present

## 2023-06-22 DIAGNOSIS — C19 Malignant neoplasm of rectosigmoid junction: Secondary | ICD-10-CM | POA: Diagnosis not present

## 2023-06-22 DIAGNOSIS — J449 Chronic obstructive pulmonary disease, unspecified: Secondary | ICD-10-CM | POA: Insufficient documentation

## 2023-06-22 DIAGNOSIS — E785 Hyperlipidemia, unspecified: Secondary | ICD-10-CM | POA: Diagnosis not present

## 2023-06-22 DIAGNOSIS — Z85048 Personal history of other malignant neoplasm of rectum, rectosigmoid junction, and anus: Secondary | ICD-10-CM

## 2023-06-22 DIAGNOSIS — Z9189 Other specified personal risk factors, not elsewhere classified: Secondary | ICD-10-CM

## 2023-06-22 NOTE — Assessment & Plan Note (Addendum)
Supportive baseline bone mineral density study and then every 2 years calcium (1000-1200 mg daily from food and supplements) and vitamin D3 (1000 IU daily) Zometa for CRPC, will start monthly at 4 mg with significant bone metastases.  Recommend obtain dental evaluation first. Control and prevent diabetes exercises (30 minutes per day) Limit alcohol consumption and avoid smoking Referral to PT

## 2023-06-22 NOTE — Progress Notes (Signed)
RN left message with daughter with direct contact information.    RN will follow to ensure PET is authorized/scheduled to ensure next steps for recommendations are completed.

## 2023-06-22 NOTE — Progress Notes (Signed)
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 04/2021 Follow-up colonoscopy demonstrated no signs of recurrent cancer  02/13/23 CEA 1.7  Prostate cancer [facility]) Current diagnosis: mCRPC Initial diagnosis: mHSPC 05/2020 Gleason score 9 Abdominal and pelvic lymphadenopathy Treatment: 07/16/2020 starteed Lupron 12/2020 completed 5500 cGy to the prostate Progression in 2023 04/21/22 Xtandi with ADT Progression in 2024. PSA 35 in 04/2023 06/2023 first visit to [facility] oncology  62 y.o.m with mCRPC post ADT/ARPI. PET scan reported multiple FDG avid lesions from June while PSA elevation is modest. CEA is not elevated with h/o high risk rectal cancer. Recommend proceed with CT guided biopsy asap for diagnosis to rule our aggressive variant vs transformation.  New baseline PET asap Biopsy on the most avid lesion if accessible after PET PSA, testosterone, CMP, CBC, LDH Referral for genetic testing Will obtain outside record of NGS that was done in the summer Patient will return to see me after PET, imaging and genetic testing   Abnormal brain MRI MRI w/out contrast reported indeterminate finding.  Will obtain MRI brain with contrast  At risk for side effect of medication Supportive baseline bone mineral density study and then every 2 years calcium (1000-1200 mg daily from food and supplements) and vitamin D3 (1000 IU daily) Zometa for CRPC, will start monthly at 4 mg with significant bone metastases. Control and prevent diabetes Weight-bearing exercises (30 minutes per day) Limit alcohol consumption and avoid smoking   Orders  Placed This Encounter  Procedures   NM PET Image Initial (PI) Skull Base To Thigh    Standing Status:   Future    Standing Expiration Date:   06/20/2024    Order Specific Question:   If indicated for the ordered procedure, I authorize the administration of a radiopharmaceutical per Radiology protocol    Answer:   Yes    Order Specific Question:   Preferred imaging location?    Answer:   [facility]   MR Brain W Wo Contrast    Standing Status:   Future    Standing Expiration Date:   06/21/2024    Order Specific Question:   If indicated for the ordered procedure, I authorize the administration of contrast media per Radiology protocol    Answer:   Yes    Order Specific Question:   What is the patient's sedation requirement?    Answer:   No Sedation    Order Specific Question:   Does the patient have a pacemaker or implanted devices?    Answer:   No    Order Specific Question:   Use SRS Protocol?    Answer:   No    Order Specific Question:   Preferred imaging location?    Answer:   [facility] (table limit - 550 lbs)   Lactate dehydrogenase    Standing Status:   Future    Standing Expiration Date:   06/20/2024   CMP (Cancer Center only)    Standing Status:   Future    Standing Expiration Date:   06/20/2024   CBC with Differential (Cancer Center Only)    Standing Status:   Future    Standing Expiration  Date:   06/20/2024   PSA, total and free    Standing Status:   Future    Standing Expiration Date:   06/20/2024   Testosterone    Standing Status:   Future    Standing Expiration Date:   06/20/2024   Ferritin    Standing Status:   Future    Standing Expiration Date:   06/20/2024   Ambulatory referral to Genetics    Referral Priority:   Routine    Referral Type:   Consultation    Referral Reason:   Specialty Services Required    Number of Visits Requested:   1    The total time spent in the appointment was 127  minutes encounter with patients including review of chart and  various tests results, discussions about plan of care and coordination of care plan  All questions were answered. The patient knows to call the clinic with any problems, questions or concerns. No barriers to learning was detected.  Melven Sartorius, MD 9/12/202410:31 AM  CHIEF COMPLAINTS/PURPOSE OF CONSULTATION:  Prostate cancer  HISTORY OF PRESENTING ILLNESS:  CARRSON BURGE 62 y.o. male is here because of prostate cancer. He is with his daugther, [name].  He has history of stage III rectal adenocarcinoma, hypertension, CVA, hyperlipidemia, chronic tobacco use, COPD in addition to prostate cancer.  I have reviewed his chart and materials related to his cancer extensively and collaborated history with the patient. Summary of oncologic history is as follows:  In brief, he has history of metastatic prostate cancer with bone metastases, lymph node metastases recently progressed on Xtandi with ADT.  He has been getting his oncologic care at [facility].  He was initially diagnosed in summer 2021.  He has underwent ADT, followed by Diana Eves.  He recently moved to [facility] to be closer to his daughter who is able to take care of him after recent hospitalization.  Report he underwent left carotid CEA and resulted in seizure-like episodes 3 days later.  He was hospitalized at that time.  Report of residual right upper extremity weakness.  He does have generalized weakness.  He denies any bone pain, back pain, despite having multiple sites of bone metastases from previous imaging.  He denies any urinary changes, increased frequency, hesitancy or hematuria.  Report of previous radiation to the prostate was helpful to relieve his symptoms.  Oncology History Overview Note  Initial diagnosis: 04/15/2020 PSA 150.24  05/2020 Abdominal and pelvic lymphadenopathy present at diagnosis. 05/28/20 Biopsy of the prostate demonstrated 8 of 8 cores positive with a Gleason score of 4+5= 9.  Gleason score 9  07/16/2020  starteed Lupron every 3 months   11/2020 Pylarify PET CT scattered hypermetabolic lymph nodes in the axilla retroperitoneal lymph nodes, and pelvic lymph nodes have resolved. New small hypermetabolic paratracheal esophageal lymph node 7 mm to be monitored.   09/2020 PSA 0.96 11/24/20 5500 cGy to the prostate completed January 05, 2021   12/09/2020 PSA 0.80 03/25/2021 PSA 0.28  01/31/2022 PSA 8.49   03/2022 PET showed evidence of early skeletal metastatic disease identified with lesions in the L3 vertebral body and the spinous process of the left scapula. Left perirectal, right obturator, and right retrocrural lymph nodes demonstrating increased tracer avidity, suspected to represent metastatic disease   04/18/2022 PSA 78.27  04/21/22 Xtandi 160 mg started. 07/22/2022 PSA 0.7 10/17/2022. PSA 1.44 02/2023 PSA 13.43 03/16/23 PSA 27.42 04/07/23 FDG PET 1.  Progressive hypermetabolic lymphadenopathy within the neck and chest as described above.  Neck maximum SUV value of 25.8.  1 cm left paratracheal lymph node demonstrating maximum SUV value of 45.0  Anterior mediastinal lymph node measuring 2.1 cm in diameter demonstrating maximum SUV value of 66.7 international units appears progressive compared to previous study (previously measured 1 cm in diameter with a maximum SUV value of 39.2 international units  ABDOMEN/PELVIS:Hypermetabolic right obturator lymph node measures 1.2 cm and demonstrates a maximum SUV value of 10.2 international units (unchanged in size with previous maximum SUV value 8.3 international units).  No discrete evidence of tracer avid disease within the prostate gland.   2.  Progressive hypermetabolic osseous metastatic disease.  Previous index hypermetabolic focus involving the L3 vertebral body probably demonstrates maximum SUV value of 77.6 international units (previous maximum SUV value 42.4 international units).  Focus of uptake involving the T4 vertebral body with maximum SUV  value of 83.2 international units is noted (previous maximum SUV value 86.3 international units).  Previous focus of FDG uptake involving the odontoid process probably demonstrates a maximum SUV value of 113 international units (previous maximum SUV value of 55.3 international units)  New hypermetabolic focus involving the right aspect of the frontal bone is identified with a maximum SUV value of 15.5 international units.  New focus of FDG uptake within the C6 vertebral body with maximum SUV value of 71.8 international units   04/2023 NGS   04/12/23 PSA 35.38  04/21/23 last visit at [facility].    Prostate cancer (HCC)  05/24/2023 Initial Diagnosis   Prostate cancer [facility])     MEDICAL HISTORY:  Past Medical History:  Diagnosis Date   BPH (benign prostatic hyperplasia)    COPD with emphysema (HCC)    CVA (cerebral vascular accident) (HCC)    Diverticulitis    History of alcohol abuse    History of colon cancer    History of tobacco abuse    Hypertension    Prostate cancer (HCC)     SURGICAL HISTORY: Past Surgical History:  Procedure Laterality Date   CAROTID ENDARTERECTOMY Left 05/11/2023   TONSILLECTOMY      SOCIAL HISTORY: Social History   Socioeconomic History   Marital status: Single    Spouse name: Not on file   Number of children: Not on file   Years of education: Not on file   Highest education level: Not on file  Occupational History   Not on file  Tobacco Use   Smoking status: Every Day    Types: Cigarettes   Smokeless tobacco: Never   Tobacco comments:    CUTTING BACK TO 8 TO 10 CIGS A DAY  Vaping Use   Vaping status: Never Used  Substance and Sexual Activity   Alcohol use: Yes   Drug use: Not Currently   Sexual activity: Not on file  Other Topics Concern   Not on file  Social History Narrative   Not on file   Social Determinants of Health   Financial Resource Strain: Low Risk  (09/09/2019)   Received from [facility]   Overall  Financial Resource Strain (CARDIA)    Difficulty of Paying Living Expenses: Not very hard  Food Insecurity: Patient Unable To Answer (05/24/2023)   Hunger Vital Sign    Worried About Running Out of Food in the Last Year: Patient unable to answer    Ran Out of Food in the Last Year: Patient unable to answer  Transportation Needs: No Transportation Needs (09/09/2019)   Received from [facility]  PRAPARE - Administrator, Civil Service (Medical): No    Lack of Transportation (Non-Medical): No  Physical Activity: Inactive (11/07/2018)   Received from [facility]   Exercise Vital Sign    Days of Exercise per Week: 0 days    Minutes of Exercise per Session: 0 min  Stress: Stress Concern Present (11/07/2018)   Received from [facility] - Occupational Stress Questionnaire    Feeling of Stress : To some extent  Social Connections: Unknown (12/06/2022)   Received from [facility]   Social Network    Social Network: Not on file  Intimate Partner Violence: Unknown (12/06/2022)   Received from [facility]   HITS    Physically Hurt: Not on file    Insult or Talk Down To: Not on file    Threaten Physical Harm: Not on file    Scream or Curse: Not on file    FAMILY HISTORY: No family history on file.  ALLERGIES:  has No Known Allergies.  MEDICATIONS:  Current Outpatient Medications  Medication Sig Dispense Refill   acetaminophen (TYLENOL) 325 MG tablet Take 2 tablets (650 mg total) by mouth every 6 (six) hours as needed for mild pain (or Fever >/= 101).     amLODipine (NORVASC) 5 MG tablet Take 5 mg by mouth at bedtime.     aspirin EC 81 MG tablet Take 1 tablet (81 mg total) by mouth daily. Swallow whole.     clopidogrel (PLAVIX) 75 MG tablet Take 75 mg by mouth daily.     Fluticasone-Umeclidin-Vilant (TRELEGY ELLIPTA) 100-62.5-25 MCG/ACT AEPB Inhale into the lungs.     folic acid (FOLVITE) 1 MG tablet Take 1  tablet (1 mg total) by mouth daily. 30 tablet 0   levETIRAcetam (KEPPRA) 1000 MG tablet Take 1 tablet (1,000 mg total) by mouth 2 (two) times daily.     metoprolol tartrate (LOPRESSOR) 50 MG tablet Take 1 tablet (50 mg total) by mouth 2 (two) times daily.     montelukast (SINGULAIR) 10 MG tablet Take 10 mg by mouth at bedtime.     Multiple Vitamin (MULTIVITAMIN WITH MINERALS) TABS tablet Take 1 tablet by mouth daily. 30 tablet 0   rosuvastatin (CRESTOR) 20 MG tablet Take 20 mg by mouth at bedtime.     tamsulosin (FLOMAX) 0.4 MG CAPS capsule Take 0.4 mg by mouth daily.     thiamine 100 MG tablet Take 1 tablet (100 mg total) by mouth daily. 30 tablet 0   No current facility-administered medications for this visit.    REVIEW OF SYSTEMS:   Constitutional: Denies fever, weight loss, decreased appetite or abnormal night sweats Respiratory: Denies cough, shortness of breath or wheezes Cardiovascular: Denies palpitation, chest discomfort or lower extremity swelling Gastrointestinal:  Denies nausea, vomiting, diarrhea, constipation or change in bowel habits or stomach pain GU: Denies any urinary changes, dysuria, hematuria Skin: Denies abnormal skin rashes Lymphatics: Denies new lymphadenopathy or easy bruising Neurological: Denies numbness, tingling or new weaknesses All other systems were reviewed with the patient and are negative.  PHYSICAL EXAMINATION: ECOG PERFORMANCE STATUS: 2 - Symptomatic, <50% confined to bed  Vitals:   06/22/23 0904  BP: 106/65  Pulse: 81  Resp: 16  Temp: (!) 97 F (36.1 C)  SpO2: 100%   Filed Weights   06/22/23 0904  Weight: 137 lb 12.8 oz (62.5 kg)    GENERAL: alert, no distress and comfortable SKIN: skin color is normal, no  jaundice, rashes EYES: sclera clear OROPHARYNX: no exudate, no erythema NECK: supple LYMPH:  no palpable lymphadenopathy in the cervical regions LUNGS: Effort normal, no respiratory distress.  Clear to auscultation  bilaterally HEART: regular rate & rhythm and no lower extremity edema ABDOMEN: soft, non-tender and nondistended Musculoskeletal: no point tenderness NEURO: no focal motor/sensory deficits.  Decreased ability to perform right alternating hand movement.  LABORATORY DATA:  I have reviewed the data as listed Lab Results  Component Value Date   WBC 13.0 (H) 05/30/2023   HGB 12.4 (L) 05/30/2023   HCT 38.9 (L) 05/30/2023   MCV 87.4 05/30/2023   PLT 358 05/30/2023   Recent Labs    05/24/23 0251 05/26/23 0357 05/30/23 0037  NA 134* 138 133*  K 3.7 4.2 4.6  CL 98 105 99  CO2 25 26 24   GLUCOSE 108* 110* 123*  BUN <5* <5* <5*  CREATININE 0.62 0.56* 0.73  CALCIUM 9.2 9.1 9.1  GFRNONAA >60 >60 >60  PROT 6.9  --   --   ALBUMIN 2.7*  --   --   AST 18  --   --   ALT 12  --   --   ALKPHOS 151*  --   --   BILITOT 0.5  --   --     RADIOGRAPHIC STUDIES: I have personally reviewed the radiological images as listed and agreed with the findings in the report. MR BRAIN W WO CONTRAST  Result Date: 05/28/2023 CLINICAL DATA:  Initial evaluation for TIA, seizure. EXAM: MRI HEAD WITHOUT AND WITH CONTRAST TECHNIQUE: Multiplanar, multiecho pulse sequences of the brain and surrounding structures were obtained without and with intravenous contrast. CONTRAST:  6mL GADAVIST GADOBUTROL 1 MMOL/ML IV SOLN COMPARISON:  None Available. FINDINGS: Brain: Examination degraded by motion artifact. Mild age-related cerebral atrophy. Patchy T2/FLAIR hyperintensity involving the periventricular deep white matter both cerebral hemispheres as well as the pons, consistent with chronic small vessel ischemic disease. Encephalomalacia and gliosis involving the left cerebral hemisphere, consistent with prior ischemic infarcts, watershed in distribution. No convincing abnormal foci of restricted diffusion to suggest acute or subacute ischemia. Gray-white matter differentiation maintained. No acute or chronic intracranial blood  products. No mass lesion within the brain itself. No mass effect or midline shift. No hydrocephalus or extra-axial fluid collection. Pituitary gland and suprasellar region within normal limits. 7 mm focus of enhancement with an adjacent prominent vessel noted at the posterior left frontoparietal region (series 11, image 42). No significant surrounding mass effect or edema. Finding is favored to be vascular in nature, possibly due to the underlying chronic ischemic changes. No other abnormal parenchymal enhancement. Vascular: Major intracranial vascular flow voids are maintained. Skull and upper cervical spine: Craniocervical junction within normal limits. There is an abnormal T1 hypointense, enhancing lesion within the right lateral mass of T1 (series 7, image 16), indeterminate, but could reflect a small osseous metastasis. Bone marrow signal intensity otherwise within normal limits. No scalp soft tissue abnormality. Sinuses/Orbits: Globes and orbital soft tissues within normal limits. Mild scattered mucosal thickening present about the ethmoidal air cells and maxillary sinuses. Paranasal sinuses are otherwise largely clear. No mastoid effusion. Other: 2.3 cm well-circumscribed cystic lesion within the subcutaneous fat of the left suboccipital region, likely a sebaceous cyst. IMPRESSION: 1. No acute intracranial abnormality. 2. Encephalomalacia and gliosis involving the left cerebral hemisphere, consistent with prior ischemic infarcts, watershed in distribution. 3. 7 mm focus of enhancement with an adjacent prominent vessel at the posterior left frontoparietal region,  favored to be vascular in nature, possibly due to the underlying chronic ischemic changes. Attention at follow-up recommended. 4. Abnormal T1 hypointense, enhancing lesion within the right lateral mass of T1, indeterminate, but could reflect an osseous metastasis. 5. Underlying age-related cerebral atrophy with chronic small vessel ischemic disease.  Electronically Signed   By: Rise Mu M.D.   On: 05/28/2023 00:32   DG CHEST PORT 1 VIEW  Result Date: 05/24/2023 CLINICAL DATA:  Altered mental status EXAM: PORTABLE CHEST 1 VIEW COMPARISON:  None Available. FINDINGS: No pleural effusion. No pneumothorax. Normal cardiac and mediastinal contours. There are prominent bilateral opacities with a possible more focal airspace opacity in the right lung base. No radiographically apparent displaced rib fractures. Visualized upper abdomen is unremarkable. IMPRESSION: Likely background of chronic lung disease with a superimposed airspace opacity at the right lung base that could represent atelectasis or infection. Electronically Signed   By: Lorenza Cambridge M.D.   On: 05/24/2023 08:27   Overnight EEG with video  Result Date: 05/24/2023 Charlsie Quest, MD     05/25/2023  9:41 AM Patient Name: Jacin Dukes MRN: [id number] Epilepsy Attending: Charlsie Quest Referring Physician/Provider: Erick Blinks, MD Duration: 05/24/2023 0406 to 05/25/2023 0406  Patient history: 62 y.o. male with PMH significant for EtOh use, COPD, prior L frontoparietal stroke, HTN, who was transferred from outside hospital for concern for focal L hemispheric seizures after L carotid endarterectomy. EEG to evaluate for seizure.  Level of alertness: Awake, asleep  AEDs during EEG study: LEV  Technical aspects: This EEG study was done with scalp electrodes positioned according to the 10-20 International system of electrode placement. Electrical activity was reviewed with band pass filter of 1-70Hz , sensitivity of 7 uV/mm, display speed of 4mm/sec with a 60Hz  notched filter applied as appropriate. EEG data were recorded continuously and digitally stored.  Video monitoring was available and reviewed as appropriate.  Description: The posterior dominant rhythm consists of 8 Hz activity of moderate voltage (25-35 uV) seen predominantly in posterior head regions, asymmetric ( left<right)  and reactive to eye opening and eye closing. Sleep was characterized by sleep spindles (12 to 15 Hz), maximal frontocentral region. EEG showed continuous 3 to 5 Hz theta-delta slowing in left hemisphere. Frequent spikes were noted in left posterior quadrant, qasi -periodic during sleep at 0.25-1Hz .  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  Additionally there was cortical dysfunction arising from left hemisphere likely secondary to underlying stroke.  No seizures were seen during this time.  Priyanka [name]

## 2023-06-22 NOTE — Assessment & Plan Note (Addendum)
MRI w/out contrast reported indeterminate finding.  Will obtain MRI brain with contrast

## 2023-06-22 NOTE — Patient Instructions (Addendum)
Dear Brian Dudley We talked about the state of your cancer today. Stage IV prostate cancer is not curable however there are treatments available, depending on a person's health status. I would like to obtain additional testing to see what may be the best treatment options going forward.  Please obtain the following I ordered. New baseline PET scan asap. Biopsy to make sure the type of prostate cancer has not changed Referral for genetic testing We are trying to get the outside record of mutation testing that was done in the summer Will start bone strengthening medication call zometa to delay any bone fracture from cancer. Needs dental clearance. Take calcium (1000-1200 mg daily from food and supplements) and vitamin D3 (1000 IU daily) Healthy diet. Avoid too much sugar. Control and prevent diabetes and heart disease. Walk, exercises if able (30 minutes per day) Avoid alcohol consumption and avoid smoking Return to see me in about 3 weeks to go over results

## 2023-06-28 ENCOUNTER — Inpatient Hospital Stay: Payer: 59

## 2023-06-28 ENCOUNTER — Inpatient Hospital Stay (HOSPITAL_BASED_OUTPATIENT_CLINIC_OR_DEPARTMENT_OTHER): Payer: 59 | Admitting: Genetic Counselor

## 2023-06-28 ENCOUNTER — Other Ambulatory Visit: Payer: Self-pay | Admitting: Genetic Counselor

## 2023-06-28 ENCOUNTER — Encounter: Payer: Self-pay | Admitting: Genetic Counselor

## 2023-06-28 DIAGNOSIS — Z85048 Personal history of other malignant neoplasm of rectum, rectosigmoid junction, and anus: Secondary | ICD-10-CM

## 2023-06-28 DIAGNOSIS — Z800 Family history of malignant neoplasm of digestive organs: Secondary | ICD-10-CM | POA: Diagnosis not present

## 2023-06-28 DIAGNOSIS — C189 Malignant neoplasm of colon, unspecified: Secondary | ICD-10-CM

## 2023-06-28 DIAGNOSIS — C61 Malignant neoplasm of prostate: Secondary | ICD-10-CM

## 2023-06-28 DIAGNOSIS — Z809 Family history of malignant neoplasm, unspecified: Secondary | ICD-10-CM | POA: Diagnosis not present

## 2023-06-28 LAB — GENETIC SCREENING ORDER

## 2023-06-28 NOTE — Progress Notes (Signed)
REFERRING PROVIDER: Melven Sartorius, MD [address] [place],  [place] [address]  PRIMARY PROVIDER:  Patient, No Pcp Per  PRIMARY REASON FOR VISIT:  1. Family history of pancreatic cancer   2. History of malignant neoplasm of rectosigmoid junction   3. Prostate cancer (HCC)      HISTORY OF PRESENT ILLNESS:   Brian Dudley, a 62 y.o. male, was seen for a [place] cancer genetics consultation at the request of Dr. Cherly Hensen due to a personal and family history of cancer.  Brian Dudley presents to clinic today to discuss the possibility of a hereditary predisposition to cancer, genetic testing, and to further clarify his future cancer risks, as well as potential cancer risks for family members.   In 2020, at the age of 54, Brian Dudley was diagnosed with colon cancer.  In 2021, at the age of 89, Brian Dudley was diagnosed with prostate cancer.  He reportedly has been cured of his colon cancer, but his prostate cancer progressed and is now stage IV. His Gleason score was 9.      CANCER HISTORY:  Oncology History Overview Note  Initial diagnosis: 04/15/2020 PSA 150.24  05/2020 Abdominal and pelvic lymphadenopathy present at diagnosis. 05/28/20 Biopsy of the prostate demonstrated 8 of 8 cores positive with a Gleason score of 4+5= 9.  Gleason score 9  07/16/2020 starteed Lupron every 3 months   11/2020 Pylarify PET CT scattered hypermetabolic lymph nodes in the axilla retroperitoneal lymph nodes, and pelvic lymph nodes have resolved. New small hypermetabolic paratracheal esophageal lymph node 7 mm to be monitored.   09/2020 PSA 0.96 11/24/20 5500 cGy to the prostate completed January 05, 2021   12/09/2020 PSA 0.80 03/25/2021 PSA 0.28  01/31/2022 PSA 8.49   03/2022 PET showed evidence of early skeletal metastatic disease identified with lesions in the L3 vertebral body and the spinous process of the left scapula. Left perirectal, right obturator, and right retrocrural lymph nodes demonstrating  increased tracer avidity, suspected to represent metastatic disease   04/18/2022 PSA 78.27  04/21/22 Xtandi 160 mg started. 07/22/2022 PSA 0.7 10/17/2022. PSA 1.44 02/2023 PSA 13.43 03/16/23 PSA 27.42 04/07/23 FDG PET 1.  Progressive hypermetabolic lymphadenopathy within the neck and chest as described above.  Neck maximum SUV value of 25.8.  1 cm left paratracheal lymph node demonstrating maximum SUV value of 45.0  Anterior mediastinal lymph node measuring 2.1 cm in diameter demonstrating maximum SUV value of 66.7 international units appears progressive compared to previous study (previously measured 1 cm in diameter with a maximum SUV value of 39.2 international units  ABDOMEN/PELVIS:Hypermetabolic right obturator lymph node measures 1.2 cm and demonstrates a maximum SUV value of 10.2 international units (unchanged in size with previous maximum SUV value 8.3 international units).  No discrete evidence of tracer avid disease within the prostate gland.   2.  Progressive hypermetabolic osseous metastatic disease.  Previous index hypermetabolic focus involving the L3 vertebral body probably demonstrates maximum SUV value of 77.6 international units (previous maximum SUV value 42.4 international units).  Focus of uptake involving the T4 vertebral body with maximum SUV value of 83.2 international units is noted (previous maximum SUV value 86.3 international units).  Previous focus of FDG uptake involving the odontoid process probably demonstrates a maximum SUV value of 113 international units (previous maximum SUV value of 55.3 international units)  New hypermetabolic focus involving the right aspect of the frontal bone is identified with a maximum SUV value of 15.5 international units.  New  focus of FDG uptake within the C6 vertebral body with maximum SUV value of 71.8 international units   04/2023 NGS   04/12/23 PSA 35.38  04/21/23 last visit at [facility].    Prostate cancer (HCC)  05/24/2023 Initial  Diagnosis   Prostate cancer (HCC)   06/22/2023 Cancer Staging   Staging form: Prostate, AJCC 8th Edition - Clinical stage from 06/22/2023: Stage IVB (rcTX, cN1, cM1c, PSA: 150, Grade Group: 5) - Signed by Melven Sartorius, MD on 06/22/2023 Stage prefix: Recurrence Prostate specific antigen (PSA) range: 20 or greater Histologic grading system: 5 grade system      Past Medical History:  Diagnosis Date   BPH (benign prostatic hyperplasia)    COPD with emphysema (HCC)    CVA (cerebral vascular accident) (HCC)    Diverticulitis    Family history of pancreatic cancer    History of alcohol abuse    History of colon cancer    History of tobacco abuse    Hypertension    Prostate cancer [facility])     Past Surgical History:  Procedure Laterality Date   CAROTID ENDARTERECTOMY Left 05/11/2023   TONSILLECTOMY      Social History   Socioeconomic History   Marital status: Single    Spouse name: Not on file   Number of children: Not on file   Years of education: Not on file   Highest education level: Not on file  Occupational History   Not on file  Tobacco Use   Smoking status: Every Day    Types: Cigarettes   Smokeless tobacco: Never   Tobacco comments:    CUTTING BACK TO 8 TO 10 CIGS A DAY  Vaping Use   Vaping status: Never Used  Substance and Sexual Activity   Alcohol use: Yes   Drug use: Not Currently   Sexual activity: Not on file  Other Topics Concern   Not on file  Social History Narrative   Not on file   Social Determinants of Health   Financial Resource Strain: Low Risk  (09/09/2019)   Received from [facility]   Overall Financial Resource Strain (CARDIA)    Difficulty of Paying Living Expenses: Not very hard  Food Insecurity: Patient Unable To Answer (05/24/2023)   Hunger Vital Sign    Worried About Running Out of Food in the Last Year: Patient unable to answer    Ran Out of Food in the Last Year: Patient unable to answer  Transportation Needs: No  Transportation Needs (09/09/2019)   Received from [facility] [place]   [employer] - Transportation    Lack of Transportation (Medical): No    Lack of Transportation (Non-Medical): No  Physical Activity: Inactive (11/07/2018)   Received from [facility]   Exercise Vital Sign    Days of Exercise per Week: 0 days    Minutes of Exercise per Session: 0 min  Stress: Stress Concern Present (11/07/2018)   Received from [facility] - Occupational Stress Questionnaire    Feeling of Stress : To some extent  Social Connections: Unknown (12/06/2022)   Received from [facility]   Social Network    Social Network: Not on file     FAMILY HISTORY:  We obtained a detailed, 4-generation family history.  Significant diagnoses are listed below: Family History  Problem Relation Age of Onset   Dementia Mother    Stroke Father    Cancer Sister  NOS   Lupus Sister    Lupus Brother    Cancer Maternal Aunt        NOS   Pancreatic cancer Niece 50     The patient has a son and daughter who are cancer free.  He has four brothers and four sisters.  One sister had cancer and one brother has a daughter who had pancreatic cancer.  Both parents are deceased.  There are no additional reports of known specific cancers in the family.  Brian Dudley is unaware of previous family history of genetic testing for hereditary cancer risks. There is no reported Ashkenazi Jewish ancestry. There is no known consanguinity.  GENETIC COUNSELING ASSESSMENT: Brian Dudley is a 62 y.o. male with a personal and family history of cancer which is somewhat suggestive of a hereditary cancer syndrome and predisposition to cancer given his metastatic prostate cancer and the combination of cancer in the family. We, therefore, discussed and recommended the following at today's visit.   DISCUSSION: We discussed that, in general, most cancer is not inherited in families, but  instead is sporadic or familial. Sporadic cancers occur by chance and typically happen at older ages (>50 years) as this type of cancer is caused by genetic changes acquired during an individual's lifetime. Some families have more cancers than would be expected by chance; however, the ages or types of cancer are not consistent with a known genetic mutation or known genetic mutations have been ruled out. This type of familial cancer is thought to be due to a combination of multiple genetic, environmental, hormonal, and lifestyle factors. While this combination of factors likely increases the risk of cancer, the exact source of this risk is not currently identifiable or testable.  We discussed that up to 15% of prostate cancer is hereditary, with most cases associated with BRCA mutations.  There are other genes that can be associated with hereditary prostate cancer syndromes. Additionally, about 5-7% of colon cancer is hereditary with most cases associated with Lynch syndrome.  We discussed that testing is beneficial for several reasons including knowing how to follow individuals after completing their treatment, identifying whether potential treatment options such as PARP inhibitors would be beneficial, and understand if other family members could be at risk for cancer and allow them to undergo genetic testing.   We reviewed the characteristics, features and inheritance patterns of hereditary cancer syndromes. We also discussed genetic testing, including the appropriate family members to test, the process of testing, insurance coverage and turn-around-time for results. We discussed the implications of a negative, positive, carrier and/or variant of uncertain significant result. Brian Dudley  was offered a common hereditary cancer panel (40+ genes) and an expanded pan-cancer panel (70+ genes). Brian Dudley was informed of the benefits and limitations of each panel, including that expanded pan-cancer panels contain  genes that do not have clear management guidelines at this point in time.  We also discussed that as the number of genes included on a panel increases, the chances of variants of uncertain significance increases. Brian Dudley decided to pursue genetic testing for the CancerNext-Expanded+RNAinsight gene panel.   The CancerNext-Expanded gene panel offered by [facility] and includes sequencing and rearrangement analysis for the following 77 genes: AIP, ALK, APC*, ATM*, AXIN2, BAP1, BARD1, BMPR1A, BRCA1*, BRCA2*, BRIP1*, CDC73, CDH1*, CDK4, CDKN1B, CDKN2A, CHEK2*, CTNNA1, DICER1, FH, FLCN, KIF1B, LZTR1, MAX, MEN1, MET, MLH1*, MSH2*, MSH3, MSH6*, MUTYH*, NF1*, NF2, NTHL1, PALB2*, PHOX2B, PMS2*, POT1, PRKAR1A, PTCH1, PTEN*, RAD51C*, RAD51D*, RB1, RET, SDHA,  SDHAF2, SDHB, SDHC, SDHD, SMAD4, SMARCA4, SMARCB1, SMARCE1, STK11, SUFU, TMEM127, TP53*, TSC1, TSC2, and VHL (sequencing and deletion/duplication); EGFR, EGLN1, HOXB13, KIT, MITF, PDGFRA, POLD1, and POLE (sequencing only); EPCAM and GREM1 (deletion/duplication only). DNA and RNA analyses performed for * genes.  Based on Brian Dudley personal and family history of cancer, he meets medical criteria for genetic testing. Despite that he meets criteria, he may still have an out of pocket cost. We discussed that if his out of pocket cost for testing is over $100, the laboratory will call and confirm whether he wants to proceed with testing.  If the out of pocket cost of testing is less than $100 he will be billed by the genetic testing laboratory.   PLAN: After considering the risks, benefits, and limitations, Brian Dudley provided informed consent to pursue genetic testing and the blood sample was sent to [facility] for analysis of the CancerNext-Expanded+RNAinsight. Results should be available within approximately 2-3 weeks' time, at which point they will be disclosed by telephone to Brian Dudley, as will any additional recommendations  warranted by these results. Brian Dudley will receive a summary of his genetic counseling visit and a copy of his results once available. This information will also be available in Epic.   Lastly, we encouraged Brian Dudley to remain in contact with cancer genetics annually so that we can continuously update the family history and inform him of any changes in cancer genetics and testing that may be of benefit for this family.   Brian Dudley questions were answered to his satisfaction today. Our contact information was provided should additional questions or concerns arise. Thank you for the referral and allowing [place] to share in the care of your patient.   Temara Lanum P. Lowell Guitar, MS, [facility] Licensed, Patent attorney [email or web address] phone: [phone]  The patient was seen for a total of 45 minutes in face-to-face genetic counseling.  The patient brought his daughter. Drs. Meliton Rattan, and/or [place] were available for questions, if needed..    _______________________________________________________________________ For Office Staff:  Number of people involved in session: 2 Was an Intern/ student involved with case: no

## 2023-06-29 NOTE — Progress Notes (Signed)
Unfortunately only genetic labs were obtained at lab visit on 9/18.  RN spoke with patient's daughter and they are agreeable to come back to have other labs MD ordered on 9/26.

## 2023-07-04 NOTE — Therapy (Incomplete)
OUTPATIENT PHYSICAL THERAPY  LOWER EXTREMITY ONCOLOGY EVALUATION  Patient Name: Brian Dudley MRN: [id number] DOB:08-30-1961, 62 y.o., male Today's Date: 07/04/2023  END OF SESSION:   Past Medical History:  Diagnosis Date   BPH (benign prostatic hyperplasia)    COPD with emphysema (HCC)    CVA (cerebral vascular accident) (HCC)    Diverticulitis    Family history of pancreatic cancer    History of alcohol abuse    History of colon cancer    History of tobacco abuse    Hypertension    Prostate cancer [facility])    Past Surgical History:  Procedure Laterality Date   CAROTID ENDARTERECTOMY Left 05/11/2023   TONSILLECTOMY     Patient Active Problem List   Diagnosis Date Noted   Family history of pancreatic cancer    Abnormal brain MRI 06/22/2023   At risk for side effect of medication 06/22/2023   Normocytic anemia 06/21/2023   COPD (chronic obstructive pulmonary disease) (HCC) 05/24/2023   Tobacco abuse 05/24/2023   HLD (hyperlipidemia) 05/24/2023   BPH (benign prostatic hyperplasia) 05/24/2023   Prostate cancer (HCC) 05/24/2023   History of malignant neoplasm of rectosigmoid junction 05/24/2023   Altered mental status 05/24/2023   Colonic diverticular abscess    Perforation of sigmoid colon due to diverticulitis 03/24/2018    PCP: Dr. Everlean Alstrom  REFERRING PROVIDER: Dr. Geanie Berlin  REFERRING DIAG:  Diagnosis  C61 (ICD-10-CM) - Prostate cancer (HCC)    THERAPY DIAG:  No diagnosis found.  ONSET DATE: 2021  Rationale for Evaluation and Treatment: Rehabilitation  SUBJECTIVE:                                                                                                                                                                                           SUBJECTIVE STATEMENT: ***  PERTINENT HISTORY: hx of rectal cancer, COPD, stage IV prostate cancer with bone mets and lymph node mets progressing with treatment. Underwent carotid CEA with seizure and  hospitalization with residual Rt UE weakness.  Bone mets L3, Lt scapula, skull - getting new PET scan 07/06/23.   PAIN:  Are you having pain? {yes/no:20286} NPRS scale: ***/10 Pain location: *** Pain orientation: {Pain Orientation:25161}  PAIN TYPE: {type:313116} Pain description: {PAIN DESCRIPTION:21022940}  Aggravating factors: *** Relieving factors: ***  PRECAUTIONS: {Therapy precautions:24002}  RED FLAGS: {PT Red Flags:29287}   WEIGHT BEARING RESTRICTIONS: {Yes ***/No:24003}  FALLS:  Has patient fallen in last 6 months? {fallsyesno:27318}  LIVING ENVIRONMENT: Lives with: {[facility] lives with:25569::"lives with their family"} Lives in: {Lives in:25570} Stairs: {yes/no:20286}; {Stairs:24000} Has following equipment at home: {Assistive devices:23999}  OCCUPATION: ***  LEISURE: ***  PRIOR LEVEL OF FUNCTION: {PLOF:24004}  PATIENT GOALS: ***   OBJECTIVE:  COGNITION: Overall cognitive status: {cognition:24006}   PALPATION: ***  OBSERVATIONS / OTHER ASSESSMENTS: ***  SENSATION: Light touch: {intact/deficits:24005} Stereognosis: {intact/deficits:24005} Hot/Cold: {intact/deficits:24005} Proprioception: {intact/deficits:24005}  POSTURE: ***  LOWER EXTREMITY STRENGTH:  MMT Right eval  Hip flexion   Hip extension   Hip abduction   Hip adduction   Hip internal rotation   Hip external rotation   Knee flexion   Knee extension   Ankle dorsiflexion   Ankle plantarflexion   Ankle inversion   Ankle eversion   Great toe extension    (Blank rows = not tested)  MMT LEFT eval  Hip flexion   Hip extension   Hip abduction   Hip adduction   Hip internal rotation   Hip external rotation   Knee flexion   Knee extension   Ankle dorsiflexion   Ankle plantarflexion   Ankle inversion   Ankle eversion   Great toe extension     (Blank rows = not tested)  LYMPHEDEMA ASSESSMENTS:   SURGERY TYPE/DATE: ***  NUMBER OF LYMPH NODES REMOVED: ***  CHEMOTHERAPY:  ***  RADIATION:***  HORMONE TREATMENT: ***  INFECTIONS: ***  LYMPHEDEMA ASSESSMENTS:   LOWER EXTREMITY LANDMARK RIGHT eval  At groin   30 cm proximal to suprapatella   20 cm proximal to suprapatella   10 cm proximal to suprapatella   At midpatella / popliteal crease   30 cm proximal to floor at lateral plantar foot   20 cm proximal to floor at lateral plantar foot   10 cm proximal to floor at lateral plantar foot   Circumference of ankle/heel   5 cm proximal to 1st MTP joint   Across MTP joint   Around proximal great toe   (Blank rows = not tested)  LOWER EXTREMITY LANDMARK LEFT eval  At groin   30 cm proximal to suprapatella   20 cm proximal to suprapatella   10 cm proximal to suprapatella   At midpatella / popliteal crease   30 cm proximal to floor at lateral plantar foot   20 cm proximal to floor at lateral plantar foot   10 cm proximal to floor at lateral plantar foot   Circumference of ankle/heel   5 cm proximal to 1st MTP joint   Across MTP joint   Around proximal great toe   (Blank rows = not tested)  UPPER EXTREMITY ROM:  {AROM/PROM:27142} ROM Right eval Left eval  Shoulder flexion    Shoulder extension    Shoulder abduction    Shoulder adduction    Shoulder extension    Shoulder internal rotation    Shoulder external rotation    Elbow flexion    Elbow extension    Wrist flexion    Wrist extension    Wrist ulnar deviation    Wrist radial deviation    Wrist pronation    Wrist supination     (Blank rows = not tested)  FUNCTIONAL TESTS:  {Functional tests:24029}  GAIT: Distance walked: *** Assistive device utilized: {Assistive devices:23999} Level of assistance: {Levels of assistance:24026} Comments: ***  L-DEX LYMPHEDEMA SCREENING: The patient was assessed using the L-Dex machine today to produce a lymphedema index baseline score. The patient will be reassessed on a regular basis (typically every 3 months) to obtain new L-Dex scores. If  the score is > 6.5 points away from his/her baseline score indicating onset of subclinical lymphedema, it will be recommended to wear a compression  garment for 4 weeks, 12 hours per day and then be reassessed. If the score continues to be > 6.5 points from baseline at reassessment, we will initiate lymphedema treatment. Assessing in this manner has a 95% rate of preventing clinically significant lymphedema.  Outcome measure:  TODAY'S TREATMENT:                                                                                                                                          DATE: ***   PATIENT EDUCATION:  Education details: *** Person educated: {Person educated:25204} Education method: {Education Method:25205} Education comprehension: {Education Comprehension:25206}  HOME EXERCISE PROGRAM: ***  ASSESSMENT:  CLINICAL IMPRESSION: Patient is a *** y.o. *** who was seen today for physical therapy evaluation and treatment for ***.   OBJECTIVE IMPAIRMENTS: {opptimpairments:25111}.   ACTIVITY LIMITATIONS: {activitylimitations:27494}  PARTICIPATION LIMITATIONS: {participationrestrictions:25113}  PERSONAL FACTORS: {Personal factors:25162} are also affecting patient's functional outcome.   REHAB POTENTIAL: {rehabpotential:25112}  CLINICAL DECISION MAKING: {clinical decision making:25114}  EVALUATION COMPLEXITY: {Evaluation complexity:25115}   GOALS: Goals reviewed with patient? {yes/no:20286}  SHORT TERM GOALS: Target date: ***  *** Baseline: Goal status: INITIAL  2.  *** Baseline:  Goal status: INITIAL  3.  *** Baseline:  Goal status: INITIAL  4.  *** Baseline:  Goal status: INITIAL  5.  *** Baseline:  Goal status: INITIAL  6.  *** Baseline:  Goal status: INITIAL  LONG TERM GOALS: Target date: ***  *** Baseline:  Goal status: INITIAL  2.  *** Baseline:  Goal status: INITIAL  3.  *** Baseline:  Goal status: INITIAL  4.  *** Baseline:  Goal  status: INITIAL  5.  *** Baseline:  Goal status: INITIAL  6.  *** Baseline:  Goal status: INITIAL  PLAN:  PT FREQUENCY: {rehab frequency:25116}  PT DURATION: {rehab duration:25117}  PLANNED INTERVENTIONS: {rehab planned interventions:25118::"Therapeutic exercises","Therapeutic activity","Neuromuscular re-education","Balance training","Gait training","Patient/Family education","Self Care","Joint mobilization"}  PLAN FOR NEXT SESSION: Idamae Lusher, PT 07/04/2023, 8:55 PM

## 2023-07-05 ENCOUNTER — Ambulatory Visit: Payer: 59

## 2023-07-05 ENCOUNTER — Telehealth: Payer: Self-pay

## 2023-07-05 ENCOUNTER — Other Ambulatory Visit: Payer: Self-pay

## 2023-07-05 ENCOUNTER — Ambulatory Visit (HOSPITAL_COMMUNITY)
Admission: RE | Admit: 2023-07-05 | Discharge: 2023-07-05 | Disposition: A | Discharge status: Home / Self Care | Payer: 59 | Source: Ambulatory Visit

## 2023-07-05 DIAGNOSIS — C61 Malignant neoplasm of prostate: Secondary | ICD-10-CM

## 2023-07-05 DIAGNOSIS — R9089 Other abnormal findings on diagnostic imaging of central nervous system: Secondary | ICD-10-CM | POA: Insufficient documentation

## 2023-07-05 DIAGNOSIS — G40901 Epilepsy, unspecified, not intractable, with status epilepticus: Secondary | ICD-10-CM | POA: Diagnosis not present

## 2023-07-05 DIAGNOSIS — G40101 Localization-related (focal) (partial) symptomatic epilepsy and epileptic syndromes with simple partial seizures, not intractable, with status epilepticus: Secondary | ICD-10-CM | POA: Diagnosis not present

## 2023-07-05 MED ORDER — GADOBUTROL 1 MMOL/ML IV SOLN
6.0000 mL | Freq: Once | INTRAVENOUS | Status: AC | PRN
  Administered 2023-07-05: 6 mL via INTRAVENOUS

## 2023-07-05 NOTE — Therapy (Signed)
Pt arrived with his daughter. He was scheduled for rehab at [facility] due to his Prostate Cancer, however, he has had a recent stroke affecting his Right UE and LE.  In conversation with his daughter, we felt he would benefit from Neuro Rehab. His daughter was given the number for Neuro Rehab on [address] as it is closer to their home. He will benefit from PT, OT and possibly Speech due to recent stroke.

## 2023-07-05 NOTE — Telephone Encounter (Signed)
Per Dr. Cherly Hensen, pt's daughter notified of PET scan being cancelled for tomorrow due to insurance purposes. Instructed that pt will still need to go to his lab appt and informed that a new scan is being ordered by Dr. Cherly Hensen, for which someone should be notifying her/pt of soon. She verbalizes understanding.

## 2023-07-05 NOTE — Progress Notes (Signed)
Brian Dudley would you let daughter know his insurance declined PET and we order a different one to see if they can approve. In the meantime if he can still get blood draw as scheduled. Thank you

## 2023-07-06 ENCOUNTER — Inpatient Hospital Stay: Payer: 59

## 2023-07-06 ENCOUNTER — Other Ambulatory Visit (HOSPITAL_COMMUNITY): Payer: 59

## 2023-07-06 ENCOUNTER — Encounter (HOSPITAL_COMMUNITY): Payer: 59

## 2023-07-08 ENCOUNTER — Inpatient Hospital Stay (HOSPITAL_COMMUNITY): Payer: 59

## 2023-07-08 ENCOUNTER — Ambulatory Visit (HOSPITAL_COMMUNITY)
Admission: EM | Admit: 2023-07-08 | Discharge: 2023-07-08 | Disposition: A | Discharge status: Home / Self Care | Payer: 59

## 2023-07-08 ENCOUNTER — Encounter (HOSPITAL_COMMUNITY): Payer: Self-pay

## 2023-07-08 ENCOUNTER — Encounter (HOSPITAL_COMMUNITY): Payer: Self-pay | Admitting: Emergency Medicine

## 2023-07-08 ENCOUNTER — Emergency Department (HOSPITAL_COMMUNITY): Payer: 59

## 2023-07-08 ENCOUNTER — Other Ambulatory Visit: Payer: Self-pay

## 2023-07-08 ENCOUNTER — Inpatient Hospital Stay (HOSPITAL_COMMUNITY)
Admission: EM | Admit: 2023-07-08 | Discharge: 2023-07-13 | DRG: 101 | Disposition: A | Discharge status: Skilled Nursing Facility | Payer: 59 | Attending: Internal Medicine | Admitting: Internal Medicine

## 2023-07-08 DIAGNOSIS — J449 Chronic obstructive pulmonary disease, unspecified: Secondary | ICD-10-CM | POA: Diagnosis present

## 2023-07-08 DIAGNOSIS — J439 Emphysema, unspecified: Secondary | ICD-10-CM | POA: Diagnosis present

## 2023-07-08 DIAGNOSIS — I10 Essential (primary) hypertension: Secondary | ICD-10-CM

## 2023-07-08 DIAGNOSIS — Z823 Family history of stroke: Secondary | ICD-10-CM

## 2023-07-08 DIAGNOSIS — C7951 Secondary malignant neoplasm of bone: Secondary | ICD-10-CM | POA: Diagnosis present

## 2023-07-08 DIAGNOSIS — E7220 Disorder of urea cycle metabolism, unspecified: Secondary | ICD-10-CM | POA: Diagnosis present

## 2023-07-08 DIAGNOSIS — C779 Secondary and unspecified malignant neoplasm of lymph node, unspecified: Secondary | ICD-10-CM | POA: Diagnosis present

## 2023-07-08 DIAGNOSIS — Z7982 Long term (current) use of aspirin: Secondary | ICD-10-CM

## 2023-07-08 DIAGNOSIS — Z8673 Personal history of transient ischemic attack (TIA), and cerebral infarction without residual deficits: Secondary | ICD-10-CM

## 2023-07-08 DIAGNOSIS — G40101 Localization-related (focal) (partial) symptomatic epilepsy and epileptic syndromes with simple partial seizures, not intractable, with status epilepticus: Secondary | ICD-10-CM | POA: Diagnosis present

## 2023-07-08 DIAGNOSIS — E872 Acidosis, unspecified: Secondary | ICD-10-CM | POA: Diagnosis present

## 2023-07-08 DIAGNOSIS — C61 Malignant neoplasm of prostate: Secondary | ICD-10-CM | POA: Diagnosis present

## 2023-07-08 DIAGNOSIS — Z7902 Long term (current) use of antithrombotics/antiplatelets: Secondary | ICD-10-CM

## 2023-07-08 DIAGNOSIS — Z8546 Personal history of malignant neoplasm of prostate: Secondary | ICD-10-CM

## 2023-07-08 DIAGNOSIS — R4701 Aphasia: Secondary | ICD-10-CM | POA: Diagnosis present

## 2023-07-08 DIAGNOSIS — E785 Hyperlipidemia, unspecified: Secondary | ICD-10-CM | POA: Diagnosis present

## 2023-07-08 DIAGNOSIS — Z85048 Personal history of other malignant neoplasm of rectum, rectosigmoid junction, and anus: Secondary | ICD-10-CM

## 2023-07-08 DIAGNOSIS — Z923 Personal history of irradiation: Secondary | ICD-10-CM | POA: Diagnosis not present

## 2023-07-08 DIAGNOSIS — C20 Malignant neoplasm of rectum: Secondary | ICD-10-CM

## 2023-07-08 DIAGNOSIS — F17210 Nicotine dependence, cigarettes, uncomplicated: Secondary | ICD-10-CM | POA: Diagnosis present

## 2023-07-08 DIAGNOSIS — R130 Aphagia: Secondary | ICD-10-CM | POA: Diagnosis not present

## 2023-07-08 DIAGNOSIS — G40901 Epilepsy, unspecified, not intractable, with status epilepticus: Secondary | ICD-10-CM | POA: Diagnosis present

## 2023-07-08 DIAGNOSIS — R4182 Altered mental status, unspecified: Secondary | ICD-10-CM

## 2023-07-08 DIAGNOSIS — R531 Weakness: Secondary | ICD-10-CM

## 2023-07-08 DIAGNOSIS — Z79899 Other long term (current) drug therapy: Secondary | ICD-10-CM

## 2023-07-08 DIAGNOSIS — N400 Enlarged prostate without lower urinary tract symptoms: Secondary | ICD-10-CM | POA: Diagnosis present

## 2023-07-08 DIAGNOSIS — E860 Dehydration: Secondary | ICD-10-CM | POA: Diagnosis present

## 2023-07-08 DIAGNOSIS — G8384 Todd's paralysis (postepileptic): Secondary | ICD-10-CM | POA: Diagnosis present

## 2023-07-08 DIAGNOSIS — R159 Full incontinence of feces: Secondary | ICD-10-CM | POA: Diagnosis not present

## 2023-07-08 DIAGNOSIS — I639 Cerebral infarction, unspecified: Secondary | ICD-10-CM

## 2023-07-08 LAB — COMPREHENSIVE METABOLIC PANEL
ALT: 12 U/L (ref 0–44)
AST: 21 U/L (ref 15–41)
Albumin: 3.5 g/dL (ref 3.5–5.0)
Alkaline Phosphatase: 241 U/L — ABNORMAL HIGH (ref 38–126)
Anion gap: 17 — ABNORMAL HIGH (ref 5–15)
BUN: 5 mg/dL — ABNORMAL LOW (ref 8–23)
CO2: 23 mmol/L (ref 22–32)
Calcium: 9.7 mg/dL (ref 8.9–10.3)
Chloride: 100 mmol/L (ref 98–111)
Creatinine, Ser: 0.56 mg/dL — ABNORMAL LOW (ref 0.61–1.24)
GFR, Estimated: >60 mL/min (ref >60)
Glucose, Bld: 117 mg/dL — ABNORMAL HIGH (ref 70–99)
Potassium: 3.8 mmol/L (ref 3.5–5.1)
Sodium: 140 mmol/L (ref 135–145)
Total Bilirubin: 0.3 mg/dL (ref 0.3–1.2)
Total Protein: 8.7 g/dL — ABNORMAL HIGH (ref 6.5–8.1)

## 2023-07-08 LAB — URINALYSIS, ROUTINE W REFLEX MICROSCOPIC
Bilirubin Urine: NEGATIVE
Glucose, UA: NEGATIVE mg/dL
Hgb urine dipstick: NEGATIVE
Ketones, ur: 5 mg/dL — AB
Leukocytes,Ua: NEGATIVE
Nitrite: NEGATIVE
Protein, ur: NEGATIVE mg/dL
Specific Gravity, Urine: 1.017 (ref 1.005–1.030)
pH: 6 (ref 5.0–8.0)

## 2023-07-08 LAB — CBC
HCT: 42.2 % (ref 39.0–52.0)
Hemoglobin: 14.1 g/dL (ref 13.0–17.0)
MCH: 28.1 pg (ref 26.0–34.0)
MCHC: 33.4 g/dL (ref 30.0–36.0)
MCV: 84.2 fL (ref 80.0–100.0)
Platelets: 292 10*3/uL (ref 150–400)
RBC: 5.01 MIL/uL (ref 4.22–5.81)
RDW: 13.6 % (ref 11.5–15.5)
WBC: 10.3 10*3/uL (ref 4.0–10.5)
nRBC: 0 % (ref 0.0–0.2)

## 2023-07-08 LAB — ETHANOL: Alcohol, Ethyl (B): <10 mg/dL (ref <10)

## 2023-07-08 LAB — RAPID URINE DRUG SCREEN, HOSP PERFORMED
Amphetamines: NOT DETECTED
Barbiturates: NOT DETECTED
Benzodiazepines: NOT DETECTED
Cocaine: NOT DETECTED
Opiates: NOT DETECTED
Tetrahydrocannabinol: NOT DETECTED

## 2023-07-08 LAB — MAGNESIUM: Magnesium: 2.1 mg/dL (ref 1.7–2.4)

## 2023-07-08 LAB — I-STAT CG4 LACTIC ACID, ED: Lactic Acid, Venous: 2.2 mmol/L (ref 0.5–1.9)

## 2023-07-08 LAB — CBG MONITORING, ED: Glucose-Capillary: 107 mg/dL — ABNORMAL HIGH (ref 70–99)

## 2023-07-08 LAB — LIPASE, BLOOD: Lipase: 23 U/L (ref 11–51)

## 2023-07-08 MED ORDER — THIAMINE HCL 100 MG/ML IJ SOLN
100.0000 mg | Freq: Every day | INTRAMUSCULAR | Status: DC
  Administered 2023-07-08 – 2023-07-12 (×5): 100 mg via INTRAVENOUS
  Filled 2023-07-08 (×5): qty 2

## 2023-07-08 MED ORDER — LORAZEPAM 2 MG/ML IJ SOLN
2.0000 mg | Freq: Once | INTRAMUSCULAR | Status: AC
  Administered 2023-07-08: 2 mg via INTRAVENOUS
  Filled 2023-07-08: qty 1

## 2023-07-08 MED ORDER — ENOXAPARIN SODIUM 40 MG/0.4ML IJ SOSY
40.0000 mg | PREFILLED_SYRINGE | INTRAMUSCULAR | Status: DC
  Administered 2023-07-08 – 2023-07-12 (×5): 40 mg via SUBCUTANEOUS
  Filled 2023-07-08 (×5): qty 0.4

## 2023-07-08 MED ORDER — SODIUM CHLORIDE 0.9 % IV SOLN
2000.0000 mg | Freq: Once | INTRAVENOUS | Status: AC
  Administered 2023-07-08: 2000 mg via INTRAVENOUS
  Filled 2023-07-08: qty 20

## 2023-07-08 MED ORDER — LEVETIRACETAM 500 MG PO TABS: 1500.0000 mg | ORAL_TABLET | Freq: Two times a day (BID) | ORAL | Status: DC

## 2023-07-08 MED ORDER — LORAZEPAM 2 MG/ML IJ SOLN
2.0000 mg | Freq: Once | INTRAMUSCULAR | Status: DC
  Filled 2023-07-08: qty 1

## 2023-07-08 MED ORDER — SODIUM CHLORIDE 0.9 % IV SOLN
200.0000 mg | Freq: Once | INTRAVENOUS | Status: DC
Start: 2023-07-08 — End: 2023-07-08

## 2023-07-08 MED ORDER — FLUTICASONE FUROATE-VILANTEROL 100-25 MCG/ACT IN AEPB
1.0000 | INHALATION_SPRAY | Freq: Every day | RESPIRATORY_TRACT | Status: DC
  Administered 2023-07-09 – 2023-07-13 (×4): 1 via RESPIRATORY_TRACT
  Filled 2023-07-08: qty 28

## 2023-07-08 MED ORDER — SODIUM CHLORIDE 0.9 % IV BOLUS
1000.0000 mL | Freq: Once | INTRAVENOUS | Status: AC
  Administered 2023-07-08: 1000 mL via INTRAVENOUS

## 2023-07-08 MED ORDER — VALPROATE SODIUM 100 MG/ML IV SOLN
1500.0000 mg | Freq: Once | INTRAVENOUS | Status: AC
  Administered 2023-07-08: 1500 mg via INTRAVENOUS
  Filled 2023-07-08: qty 15

## 2023-07-08 MED ORDER — LEVETIRACETAM IN NACL 1500 MG/100ML IV SOLN
1500.0000 mg | Freq: Two times a day (BID) | INTRAVENOUS | Status: DC
  Administered 2023-07-09 (×2): 1500 mg via INTRAVENOUS
  Filled 2023-07-08 (×2): qty 100

## 2023-07-08 MED ORDER — UMECLIDINIUM BROMIDE 62.5 MCG/ACT IN AEPB
1.0000 | INHALATION_SPRAY | Freq: Every day | RESPIRATORY_TRACT | Status: DC
  Administered 2023-07-09 – 2023-07-13 (×4): 1 via RESPIRATORY_TRACT
  Filled 2023-07-08 (×2): qty 7

## 2023-07-08 NOTE — Consult Note (Signed)
NEUROLOGY CONSULT NOTE   Date of service: July 08, 2023 Patient Name: Brian Dudley MRN:  [id number] DOB:  1960-12-14 Chief Complaint: "Rue twitching and Aphasia" Requesting Provider: Derwood Kaplan, MD  History of Present Illness  Brian Dudley is a 62 y.o. male with hx of prior L MCA stroke c/b seizures on Keppra 1000mg  BID with recent EEG showing L posterior quadrant spikes, hx of COPD, CVA, HTN, BPH, prostate cancer stage IV, history of stage III rectosigmoid cancer who presents with aphasia out of proportion to confusion and intermittent RUE twitching.  He is unable to provide any history secondary to aphasia.  Daughter reported that yesterday morning patient was at his baseline.  He got up, make coffee and sat down in the recliner in the living room.  In the afternoon, they noted that he stayed in the recliner and has not gotten up.  When family helped him get to the bathroom, they noted that he was having significant trouble with walking.  He was unable to get up.  They put briefs on him.  They were able to get him to eat for dinner.  They use wheelchair to transfer him to the bed and patient was able to help with the transfer.  They noted that he had taken his short off and he was covered in vomit.  This is very unusual for him.  They also report that last night, he was screaming loud in his right arm look like it was doing some twitches.  He was using his left hand to pick up his right arm.  Family called EMS and he was brought into the ED.  He was noted to have intermittent right upper extremity twitching and neurology was consulted for further evaluation and workup. He has not missed any doses of Keppra. No recent sickness.   ROS   Unable to obtain review of system secondary to aphasia and encephalopathy.  Past History   Past Medical History:  Diagnosis Date   BPH (benign prostatic hyperplasia)    COPD with emphysema (HCC)    CVA (cerebral vascular accident) (HCC)     Diverticulitis    Family history of pancreatic cancer    History of alcohol abuse    History of colon cancer    History of tobacco abuse    Hypertension    Prostate cancer [facility])    Past Surgical History:  Procedure Laterality Date   CAROTID ENDARTERECTOMY Left 05/11/2023   TONSILLECTOMY     Family History  Problem Relation Age of Onset   Dementia Mother    Stroke Father    Cancer Sister        NOS   Lupus Sister    Lupus Brother    Cancer Maternal Aunt        NOS   Pancreatic cancer Niece 78   Social History   Socioeconomic History   Marital status: Single    Spouse name: Not on file   Number of children: Not on file   Years of education: Not on file   Highest education level: Not on file  Occupational History   Not on file  Tobacco Use   Smoking status: Every Day    Types: Cigarettes   Smokeless tobacco: Never   Tobacco comments:    CUTTING BACK TO 8 TO 10 CIGS A DAY  Vaping Use   Vaping status: Never Used  Substance and Sexual Activity   Alcohol use: Yes   Drug use: Not  Currently   Sexual activity: Not on file  Other Topics Concern   Not on file  Social History Narrative   Not on file   Social Determinants of Health   Financial Resource Strain: Low Risk  (09/09/2019)   Received from [facility]   Overall Financial Resource Strain (CARDIA)    Difficulty of Paying Living Expenses: Not very hard  Food Insecurity: Patient Unable To Answer (05/24/2023)   Hunger Vital Sign    Worried About Running Out of Food in the Last Year: Patient unable to answer    Ran Out of Food in the Last Year: Patient unable to answer  Transportation Needs: No Transportation Needs (09/09/2019)   Received from [facility]   [employer] - Transportation    Lack of Transportation (Medical): No    Lack of Transportation (Non-Medical): No  Physical Activity: Inactive (11/07/2018)   Received from [facility]   Exercise Vital Sign    Days of Exercise  per Week: 0 days    Minutes of Exercise per Session: 0 min  Stress: Stress Concern Present (11/07/2018)   Received from [facility] - Occupational Stress Questionnaire    Feeling of Stress : To some extent  Social Connections: Unknown (12/06/2022)   Received from [facility]   Social Network    Social Network: Not on file   No Known Allergies  Medications  (Not in a hospital admission)    Vitals   Vitals:   07/08/23 1445 07/08/23 1530 07/08/23 1545 07/08/23 1630  BP: (!) 133/116 (!) 140/66  (!) 157/75  Pulse: 86 72 74 83  Resp: (!) 29 (!) 31 (!) 25 (!) 24  Temp:      SpO2: 95% 96% 96% 99%  Weight:      Height:         Body mass index is 25.46 kg/m.  Physical Exam   Constitutional: Appears well-developed and well-nourished. Psych: Affect appropriate to situation.  Eyes: No scleral injection.  HENT: No OP obstrucion.  Head: Normocephalic.  Cardiovascular: Normal rate and regular rhythm.  Respiratory: Effort normal, non-labored breathing.  GI: Soft.  No distension. There is no tenderness.  Skin: WDI.   Neurologic Examination  Mental status/Cognition: somnolent, opens eye to voice. Does not answer any orientation questions. Speech/language: word salad with expressive aphasia and receptive aphasia out of proportion to his encephalopathy. Was able to follow simple commands intermittently.  Cranial nerves:   CN II Pupils equal and reactive to light, no VF deficits   CN III,IV,VI EOM intact, no gaze preference or deviation, no nystagmus    CN V normal sensation in V1, V2, and V3 segments bilaterally    CN VII no asymmetry, no nasolabial fold flattening    CN VIII normal hearing to speech    CN IX & X normal palatal elevation, no uvular deviation    CN XI    CN XII midline tongue protrusion    Motor:  Muscle bulk: normal, tone intermittent RUE twitches. He moves left upper extremity left lower extremity antigravity  without any drift. No volitional movement noted in right upper extremity with intermittent right upper extremity twitches.  Mild right lower extremity weakness but does have some antigravity movement although his leg falls to the bed within a couple secs.  Sensation:  Light touch    Pin prick Localizes to proximal pinch in all extremities.   Temperature  Vibration   Proprioception    Coordination/Complex Motor:  Unable to assess.  Labs   CBC:  Recent Labs  Lab 07/08/23 1245  WBC 10.3  HGB 14.1  HCT 42.2  MCV 84.2  PLT 292    Basic Metabolic Panel:  Lab Results  Component Value Date   NA 140 07/08/2023   K 3.8 07/08/2023   CO2 23 07/08/2023   GLUCOSE 117 (H) 07/08/2023   BUN 5 (L) 07/08/2023   CREATININE 0.56 (L) 07/08/2023   CALCIUM 9.7 07/08/2023   GFRNONAA >60 07/08/2023   GFRAA >60 03/28/2018   Lipid Panel: No results found for: "LDLCALC" HgbA1c:  Lab Results  Component Value Date   HGBA1C 6.3 (H) 05/24/2023   Urine Drug Screen:     Component Value Date/Time   LABOPIA NONE DETECTED 07/08/2023 1848   COCAINSCRNUR NONE DETECTED 07/08/2023 1848   LABBENZ NONE DETECTED 07/08/2023 1848   AMPHETMU NONE DETECTED 07/08/2023 1848   THCU NONE DETECTED 07/08/2023 1848   LABBARB NONE DETECTED 07/08/2023 1848    Alcohol Level     Component Value Date/Time   ETH <10 07/08/2023 1430   INR  Lab Results  Component Value Date   INR 1.21 03/27/2018   APTT No results found for: "APTT" AED levels: No results found for: "PHENYTOIN", "ZONISAMIDE", "LAMOTRIGINE", "LEVETIRACETA"   CT Head without contrast(Personally reviewed): Chronic left MCA territory infarct encephalomalacia, no acute abnormalities.  MRI Brain(Personally reviewed): pending  cEEG:  L hemispheric PLEDs at 1 Hz.  Impression   Brian Dudley is a 62 y.o. male with hx of prior L MCA stroke c/b seizures on Keppra 1000mg  BID with recent EEG showing L posterior quadrant spikes, hx of COPD,  CVA, HTN, BPH, prostate cancer stage IV, history of stage III rectosigmoid cancer who presents with aphasia out of proportion to confusion and intermittent RUE twitching. LTM EEG demonstrated PLEDS at 1Hz . With the noted clinical twitching, this is concerning for focal status epilepticus.  He clinically improved after Valproic acid load but when I saw him again around 630, seems to have declined again.  Recommendations  - Ativan 2mg  IV once, followed by Valproic acid load of 20mg /Kg. - Keppra increased to 1500mg  BID, added maintenance Valproic acid 250mg  Q6H - Loaded with fosphenytoin in AM. - LTM EEG. Further AEDs based on LTM EEG. ______________________________________________________________________  This patient is critically ill and at significant risk of neurological worsening, death and care requires constant monitoring of vital signs, hemodynamics,respiratory and cardiac monitoring, neurological assessment, discussion with family, other specialists and medical decision making of high complexity. I spent 70 minutes of neurocritical care time  in the care of  this patient. This was time spent independent of any time provided by nurse practitioner or PA.  Erick Blinks [facility] 07/09/2023  7:39 AM   Thank you for the opportunity to take part in the care of this patient. If you have any further questions, please contact the neurology consultation team on call. Updated oncall schedule is listed on AMION.  Signed,  Erick Blinks

## 2023-07-08 NOTE — H&P (Cosign Needed)
Date: 07/08/2023               Patient Name:  Brian Dudley MRN: [id number]  DOB: 09-11-1961 Age / Sex: 62 y.o., male   PCP: Jerrell Belfast, MD         Medical Service: Internal Medicine Teaching Service         Attending Physician: Dr. Ginnie Smart, MD      First Contact: Dr. Carmina Miller, DO Pager [phone]    Second Contact: Dr. Marrianne Mood, MD Pager [phone]         After Hours (After 5p/  First Contact Pager: [phone]  weekends / holidays): Second Contact Pager: [phone]   SUBJECTIVE   Chief Complaint: Increasing right arm/leg weakess; worsened aphasia  History of Present Illness: Brian Dudley is a 62 year old male with PMH of recent left MCA CVA c/b seizures, Stage IV prostate cancer, history of stage III rectosigmoid cancer s/p resection, COPD, HTN, HLD, and BPH who presents to ED with one day history of worsening aphasia, weakness, and right arm twitching. Patient had left MCA in June which resulted in right-sighted weakness (UE>LE) and expressive aphasia. Patient began having seizures soon after and has been compliantly taking Keppra.  Patient with limited ability to communicate so most of history obtained from phone conversation with daughter, Brian Dudley - Patient has aforementioned deficits at baseline but started developing worsening weakness and inability to ambulate (patient uses walker at baseline) as well as worsening aphasia and new onset right arm twitching that began yesterday afternoon and gradually increased in severity. Daughter decided to bring [name] to the ED when he vomited this morning and other symptoms had continued. Patient endorses right arm pain but denies chest pain/palpitations, SOB, abdominal pain, or change in urinary/bowel habits.  ED Course:  CT head unremarkable. DG Chest noted mild left basilar atelectasis/airspace disease.  Labs largely unremarkable aside from mild elevation in lactate at 2.2 Neurology consulted and states  patient is in focal status epilepticus - patient was given loading dose of Keppra and Depakote in addition to one dose of Ativan. MRI/EEG ordered.   Medications:  Current Outpatient Medications  Medication Instructions   acetaminophen (TYLENOL) 650 mg, Oral, Every 6 hours PRN   amLODipine (NORVASC) 5 mg, Oral, Daily at bedtime   aspirin EC 81 mg, Oral, Daily, Swallow whole.   clopidogrel (PLAVIX) 75 mg, Oral, Daily   Fluticasone-Umeclidin-Vilant (TRELEGY ELLIPTA) 100-62.5-25 MCG/ACT AEPB 1 puff, Inhalation, Daily   folic acid (FOLVITE) 1 mg, Oral, Daily   levETIRAcetam (KEPPRA) 1,000 mg, Oral, 2 times daily   metoprolol tartrate (LOPRESSOR) 50 mg, Oral, 2 times daily   montelukast (SINGULAIR) 10 mg, Oral, Daily at bedtime   Multiple Vitamin (MULTIVITAMIN WITH MINERALS) TABS tablet 1 tablet, Oral, Daily   rosuvastatin (CRESTOR) 20 mg, Oral, Daily at bedtime   tamsulosin (FLOMAX) 0.4 mg, Oral, Daily   thiamine (VITAMIN B1) 100 mg, Oral, Daily    Past Medical History Past Medical History:  Diagnosis Date   BPH (benign prostatic hyperplasia)    COPD with emphysema (HCC)    CVA (cerebral vascular accident) (HCC)    Diverticulitis    Family history of pancreatic cancer    History of alcohol abuse    History of colon cancer    History of tobacco abuse    Hypertension    Prostate cancer [facility])     Past Surgical History Past Surgical History:  Procedure Laterality Date   CAROTID ENDARTERECTOMY Left  05/11/2023   TONSILLECTOMY       Family History:  Family History  Problem Relation Age of Onset   Dementia Mother    Stroke Father    Cancer Sister        NOS   Lupus Sister    Lupus Brother    Cancer Maternal Aunt        NOS   Pancreatic cancer Niece 42    Allergies: Allergies as of 07/08/2023   (No Known Allergies)    Review of Systems: A complete ROS was negative except as per HPI.   OBJECTIVE:   Physical Exam: Blood pressure (!) 157/75, pulse 83, temperature  97.8 F (36.6 C), resp. rate (!) 24, height 5\' 5"  (1.651 m), weight 69.4 kg, SpO2 99%.  Physical Exam: Constitutional: lying in bed, in no acute distress Cardiovascular: regular rate and rhythm, no m/r/g Pulmonary/Chest: normal work of breathing on room air, lungs clear to auscultation bilaterally Abdominal: soft, non-tender, non-distended, multiple scars from resection MSK: normal bulk and tone, 1/5 RUE strength, 4/5 RLE strength, right arm intermittently twitching, 5/5 LU/LE Neurological: alert & oriented but unable to communicate name/identify pen; most communication nonsensical; able to mostly follow commands, CN II-VII grossly intact Skin: warm and dry   Labs: CBC    Component Value Date/Time   WBC 10.3 07/08/2023 1245   RBC 5.01 07/08/2023 1245   HGB 14.1 07/08/2023 1245   HCT 42.2 07/08/2023 1245   PLT 292 07/08/2023 1245   MCV 84.2 07/08/2023 1245   MCH 28.1 07/08/2023 1245   MCHC 33.4 07/08/2023 1245   RDW 13.6 07/08/2023 1245   LYMPHSABS 1.9 05/24/2023 0251   MONOABS 0.5 05/24/2023 0251   EOSABS 0.2 05/24/2023 0251   BASOSABS 0.1 05/24/2023 0251     CMP     Component Value Date/Time   NA 140 07/08/2023 1245   K 3.8 07/08/2023 1245   CL 100 07/08/2023 1245   CO2 23 07/08/2023 1245   GLUCOSE 117 (H) 07/08/2023 1245   BUN 5 (L) 07/08/2023 1245   CREATININE 0.56 (L) 07/08/2023 1245   CALCIUM 9.7 07/08/2023 1245   PROT 8.7 (H) 07/08/2023 1245   ALBUMIN 3.5 07/08/2023 1245   AST 21 07/08/2023 1245   ALT 12 07/08/2023 1245   ALKPHOS 241 (H) 07/08/2023 1245   BILITOT 0.3 07/08/2023 1245   GFRNONAA >60 07/08/2023 1245   GFRAA >60 03/28/2018 0622     ASSESSMENT & PLAN:   Assessment & Plan by Problem: Principal Problem:   Status epilepticus (HCC) Active Problems:   COPD (chronic obstructive pulmonary disease) (HCC)   HLD (hyperlipidemia)   BPH (benign prostatic hyperplasia)   Prostate cancer (HCC)   Hypertension   Brian Dudley is a 62 y.o. person  living with PMH of recent left MCA CVA c/b seizures, Stage IV prostate cancer, history of stage III rectosigmoid cancer s/p resection, HTN, and BPH who presents to ED with one day history of worsening aphasia, weakness, and right arm twitching.   Hospital Day: 0  #Focal Status Epilepticus Neurology following. Patient given loading dose of Keppra and Depakote in addition to one dose of Ativan. CT head unremarkable and MRI/EEG pending.  -Appreciate Neurology recommendations -Continue Thiamine -Continue Keppra 1,500 mg BID -Follow-up MRI/EEG -Swallow screen ordered -Neurochecks q 2 -Seizure precautions  #Elevated Lactate Mild elevation at 2.2. Vitals, BMP, CBC unremarkable. Low concern for cardiac/infectious etiology. Patient given I L IVF's in ED. -Trend Lactate -Trend CBC/BNP  #COPD No  SOB, no wheezing, O2 sat. Is 100%. -Continue home inhalers  #Metastatic Prostate Cancer Follows with Dr. Geanie Berlin with Oncology. Patient has received radiation and ADT/ARPI. Known metastasis to lymph nodes/bone. MRI on 9/25 with concerns for C1/C2 metastasis. Patient scheduled for repeat PET scan with plan for subsequent biopsy. Follow-up appointment scheduled for 10/03. Patient denies difficulties with urination/dysuria.  #HTN #HLD #BPH -Holding home meds until swallow screen can be evaluated.   Diet: NPO VTE: Enoxaparin Code: Full   Dispo: Admit patient to Inpatient with expected length of stay greater than 2 midnights.  Signed: Carmina Miller, DO Internal Medicine Resident PGY-1 07/08/2023, 11:05 PM

## 2023-07-08 NOTE — ED Triage Notes (Signed)
Caregivers add that patient was vomiting during the night. And vomited all his morning medications today.   They also state that patient was able to walk up til today. Today pt is not able to bear weight.

## 2023-07-08 NOTE — ED Notes (Signed)
ED TO INPATIENT HANDOFF REPORT  ED Nurse Name and Phone #: Angelique Holm [phone]  S Name/Age/Gender Brian Dudley 62 y.o. male Room/Bed: 012C/012C  Code Status   Code Status: Full Code  Home/SNF/Other Home Patient oriented to: self Is this baseline? No   Triage Complete: Triage complete  Chief Complaint Status epilepticus (HCC) [G40.901]  Triage Note Pt came in via POV with daughter d/t her worries that she noticed he could not walk like he usually can last night. He does have residual weakness/deficit from a stroke this past April (2024) & this morning she also noticed pt was having sever pain d/t spasms in that right arm. Daughter reports he was A/Ox3 before but now could follow commands & express himself. Now he is crying out for his mother, not remembering she has passed & not following commands. Pt vomited after daughter crushed his meds for him & gave him a piece of toast & coffee this morning as well.     Allergies No Known Allergies  Level of Care/Admitting Diagnosis ED Disposition     ED Disposition  Admit   Condition  --   Comment  Hospital Area: [facility] [[id number]]  Level of Care: Progressive [102]  Admit to Progressive based on following criteria: NEUROLOGICAL AND NEUROSURGICAL complex patients with significant risk of instability, who do not meet ICU criteria, yet require close observation or frequent assessment (< / = every 2 - 4 hours) with medical / nursing intervention.  May admit patient to [facility] or [facility] if equivalent level of care is available:: No  Covid Evaluation: Asymptomatic - no recent exposure (last 10 days) testing not required  Diagnosis: Status epilepticus [facility]) [[id number]]  Admitting Physician: Ginnie Smart [[id number]]  Attending Physician: Ninetta Lights, JEFFREY C [[id number]]  Certification:: I certify this patient will need inpatient services for at least 2 midnights  Expected Medical Readiness: 07/12/2023           B Medical/Surgery History Past Medical History:  Diagnosis Date   BPH (benign prostatic hyperplasia)    COPD with emphysema (HCC)    CVA (cerebral vascular accident) (HCC)    Diverticulitis    Family history of pancreatic cancer    History of alcohol abuse    History of colon cancer    History of tobacco abuse    Hypertension    Prostate cancer [facility])    Past Surgical History:  Procedure Laterality Date   CAROTID ENDARTERECTOMY Left 05/11/2023   TONSILLECTOMY       A IV Location/Drains/Wounds Patient Lines/Drains/Airways Status     Active Line/Drains/Airways     Name Placement date Placement time Site Days   Peripheral IV 07/08/23 22 G Antecubital 07/08/23  1514  Antecubital  less than 1            Intake/Output Last 24 hours No intake or output data in the 24 hours ending 07/08/23 2127  Labs/Imaging Results for orders placed or performed during the hospital encounter of 07/08/23 (from the past 48 hour(s))  Comprehensive metabolic panel     Status: Abnormal   Collection Time: 07/08/23 12:45 PM  Result Value Ref Range   Sodium 140 135 - 145 mmol/L   Potassium 3.8 3.5 - 5.1 mmol/L   Chloride 100 98 - 111 mmol/L   CO2 23 22 - 32 mmol/L   Glucose, Bld 117 (H) 70 - 99 mg/dL    Comment: Glucose reference range applies only to samples taken after fasting  for at least 8 hours.   BUN 5 (L) 8 - 23 mg/dL   Creatinine, Ser 4.13 (L) 0.61 - 1.24 mg/dL   Calcium 9.7 8.9 - 24.4 mg/dL   Total Protein 8.7 (H) 6.5 - 8.1 g/dL   Albumin 3.5 3.5 - 5.0 g/dL   AST 21 15 - 41 U/L   ALT 12 0 - 44 U/L   Alkaline Phosphatase 241 (H) 38 - 126 U/L   Total Bilirubin 0.3 0.3 - 1.2 mg/dL   GFR, Estimated >01 >02 mL/min    Comment: (NOTE) Calculated using the CKD-EPI Creatinine Equation (2021)    Anion gap 17 (H) 5 - 15    Comment: Performed at [facility], [address] [place], [place] [address]  CBC     Status: None   Collection Time: 07/08/23 12:45 PM  Result Value  Ref Range   WBC 10.3 4.0 - 10.5 K/uL   RBC 5.01 4.22 - 5.81 MIL/uL   Hemoglobin 14.1 13.0 - 17.0 g/dL   HCT 64.4 03.4 - 74.2 %   MCV 84.2 80.0 - 100.0 fL   MCH 28.1 26.0 - 34.0 pg   MCHC 33.4 30.0 - 36.0 g/dL   RDW 59.5 63.8 - 75.6 %   Platelets 292 150 - 400 K/uL   nRBC 0.0 0.0 - 0.2 %    Comment: Performed at [facility], [address] [place], [place] [address]  Lipase, blood     Status: None   Collection Time: 07/08/23 12:45 PM  Result Value Ref Range   Lipase 23 11 - 51 U/L    Comment: Performed at [facility], [address], [place], [place] [address]  CBG monitoring, ED     Status: Abnormal   Collection Time: 07/08/23  1:47 PM  Result Value Ref Range   Glucose-Capillary 107 (H) 70 - 99 mg/dL    Comment: Glucose reference range applies only to samples taken after fasting for at least 8 hours.  Magnesium     Status: None   Collection Time: 07/08/23  2:12 PM  Result Value Ref Range   Magnesium 2.1 1.7 - 2.4 mg/dL    Comment: Performed at [facility], [address] [place], [place] [address]  Ethanol     Status: None   Collection Time: 07/08/23  2:30 PM  Result Value Ref Range   Alcohol, Ethyl (B) <10 <10 mg/dL    Comment: (NOTE) Lowest detectable limit for serum alcohol is 10 mg/dL.  For medical purposes only. Performed at [facility], [address] [place], [place] [address]   I-Stat CG4 Lactic Acid     Status: Abnormal   Collection Time: 07/08/23  3:04 PM  Result Value Ref Range   Lactic Acid, Venous 2.2 (HH) 0.5 - 1.9 mmol/L   Comment NOTIFIED PHYSICIAN   Urinalysis, Routine w reflex microscopic -Urine, Clean Catch     Status: Abnormal   Collection Time: 07/08/23  6:48 PM  Result Value Ref Range   Color, Urine YELLOW YELLOW   APPearance CLEAR CLEAR   Specific Gravity, Urine 1.017 1.005 - 1.030   pH 6.0 5.0 - 8.0   Glucose, UA NEGATIVE NEGATIVE mg/dL   Hgb urine dipstick NEGATIVE NEGATIVE   Bilirubin Urine NEGATIVE NEGATIVE    Ketones, ur 5 (A) NEGATIVE mg/dL   Protein, ur NEGATIVE NEGATIVE mg/dL   Nitrite NEGATIVE NEGATIVE   Leukocytes,Ua NEGATIVE NEGATIVE    Comment:  Performed at [facility], [address], [place], [place] [address]  Rapid urine drug screen (hospital performed)     Status: None   Collection Time: 07/08/23  6:48 PM  Result Value Ref Range   Opiates NONE DETECTED NONE DETECTED   Cocaine NONE DETECTED NONE DETECTED   Benzodiazepines NONE DETECTED NONE DETECTED   Amphetamines NONE DETECTED NONE DETECTED   Tetrahydrocannabinol NONE DETECTED NONE DETECTED   Barbiturates NONE DETECTED NONE DETECTED    Comment: (NOTE) DRUG SCREEN FOR MEDICAL PURPOSES ONLY.  IF CONFIRMATION IS NEEDED FOR ANY PURPOSE, NOTIFY LAB WITHIN 5 DAYS.  LOWEST DETECTABLE LIMITS FOR URINE DRUG SCREEN Drug Class                     Cutoff (ng/mL) Amphetamine and metabolites    1000 Barbiturate and metabolites    200 Benzodiazepine                 200 Opiates and metabolites        300 Cocaine and metabolites        300 THC                            50 Performed at [facility], [address], [place], [place] [address]    CT Head Wo Contrast  Result Date: 07/08/2023 CLINICAL DATA:  Mental status changes. EXAM: CT HEAD WITHOUT CONTRAST TECHNIQUE: Contiguous axial images were obtained from the base of the skull through the vertex without intravenous contrast. RADIATION DOSE REDUCTION: This exam was performed according to the departmental dose-optimization program which includes automated exposure control, adjustment of the mA and/or kV according to patient size and/or use of iterative reconstruction technique. COMPARISON:  MRI brain 07/05/2023 FINDINGS: Brain: There is no evidence for acute hemorrhage, hydrocephalus, mass lesion, or abnormal extra-axial fluid collection. No definite CT evidence for acute infarction. Patchy low attenuation in the deep hemispheric and periventricular white matter is  nonspecific, but likely reflects chronic microvascular ischemic demyelination. Old left MCA territory infarct again noted. Vascular: No hyperdense vessel or unexpected calcification. Skull: No evidence for fracture. No worrisome lytic or sclerotic lesion. Sinuses/Orbits: The visualized paranasal sinuses and mastoid air cells are clear. Visualized portions of the globes and intraorbital fat are unremarkable. Other: None. IMPRESSION: 1. No acute intracranial abnormality. 2. Old left MCA territory infarct. 3. Chronic small vessel white matter ischemic disease. Electronically Signed   By: Kennith Center M.D.   On: 07/08/2023 16:31   DG Chest Portable 1 View  Result Date: 07/08/2023 CLINICAL DATA:  Altered mental status. EXAM: PORTABLE CHEST 1 VIEW COMPARISON:  Chest radiograph dated 05/24/2023. FINDINGS: The heart size and mediastinal contours are within normal limits. Mild left basilar atelectasis/airspace disease. No significant pleural effusion or pneumothorax. Degenerative changes are seen in the spine. IMPRESSION: Mild left basilar atelectasis/airspace disease. Electronically Signed   By: Romona Curls M.D.   On: 07/08/2023 14:49    Pending Labs Unresulted Labs (From admission, onward)    None       Vitals/Pain Today's Vitals   07/08/23 1530 07/08/23 1545 07/08/23 1630 07/08/23 2124  BP: (!) 140/66  (!) 157/75 (!) 152/72  Pulse: 72 74 83 87  Resp: (!) 31 (!) 25 (!) 24 20  Temp:    98.8 F (37.1 C)  TempSrc:    Axillary  SpO2: 96% 96% 99% 100%  Weight:  Height:        Isolation Precautions No active isolations  Medications Medications  levETIRAcetam (KEPPRA) 2,000 mg in sodium chloride 0.9 % 250 mL IVPB (has no administration in time range)  valproate (DEPACON) 1,500 mg in dextrose 5 % 50 mL IVPB (has no administration in time range)  LORazepam (ATIVAN) injection 2 mg (has no administration in time range)  enoxaparin (LOVENOX) injection 40 mg (has no administration in time  range)  sodium chloride 0.9 % bolus 1,000 mL (1,000 mLs Intravenous Bolus 07/08/23 1516)    Mobility non-ambulatory     Focused Assessments Cardiac Assessment Handoff:    No results found for: "CKTOTAL", "CKMB", "CKMBINDEX", "TROPONINI" No results found for: "DDIMER" Does the Patient currently have chest pain? No   , Neuro Assessment Handoff:  Swallow screen pass? No          Neuro Assessment: Exceptions to WDL Neuro Checks:      Has TPA been given? No If patient is a Neuro Trauma and patient is going to OR before floor call report to 4N Charge nurse: [phone] or [phone]   R Recommendations: See Admitting Provider Note  Report given to:   Additional Notes: EEG being placed at bedside at this time

## 2023-07-08 NOTE — ED Triage Notes (Signed)
Pt had stroke and right side deficit. This morning now having right arm muscle spasms and pt seems to be in pain.

## 2023-07-08 NOTE — Progress Notes (Signed)
[facility] NOT monitoring

## 2023-07-08 NOTE — ED Provider Notes (Signed)
[place] EMERGENCY DEPARTMENT AT [facility] Provider Note   CSN: [id number] Arrival date & time: 07/08/23  1207     History  Chief Complaint  Patient presents with   AMS   Spasms Rt arm   Nausea   Emesis    Brian Dudley is a 62 y.o. male resenting to the emergency room with altered mental status.  Family at bedside reports that 12pm yesterday last known well.  When family member returned home at 4 PM he was altered. They report of altered mental status, confusion, unable to communicate. 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  At baseline is normally able to talk in full sentence and communicate with family members.  At baseline typically able to ambulate with cane and has right upper extremity weakness unable to move that arm.  They have also noticed some periodic twitching of the right upper extremity and left lower extremity.  Family not aware of this patient having abdominal pain, chest pain or any other complaints prior to AMS.  Reports he is taking all of his medications however he did vomit this morning   Emesis      Home Medications Prior to Admission medications   Medication Sig Start Date End Date Taking? Authorizing Provider  acetaminophen (TYLENOL) 325 MG tablet Take 2 tablets (650 mg total) by mouth every 6 (six) hours as needed for mild pain (or Fever >/= 101). 03/28/18   Elgergawy, Leana Roe, MD  amLODipine (NORVASC) 5 MG tablet Take 5 mg by mouth at bedtime.    [provider]  aspirin EC 81 MG tablet Take 1 tablet (81 mg total) by mouth daily. Swallow whole. 05/31/23   [place], Alvira Philips, DO  clopidogrel (PLAVIX) 75 MG tablet Take 75 mg by mouth daily.    [provider]  Fluticasone-Umeclidin-Vilant (TRELEGY ELLIPTA) 100-62.5-25 MCG/ACT AEPB Inhale into the lungs.    [provider]  folic acid (FOLVITE) 1 MG tablet Take 1 tablet  (1 mg total) by mouth daily. 03/29/18   Elgergawy, Leana Roe, MD  levETIRAcetam (KEPPRA) 1000 MG tablet Take 1 tablet (1,000 mg total) by mouth 2 (two) times daily. 05/30/23   [place], Alvira Philips, DO  metoprolol tartrate (LOPRESSOR) 50 MG tablet Take 1 tablet (50 mg total) by mouth 2 (two) times daily. 05/30/23   [place], Eric J, DO  montelukast (SINGULAIR) 10 MG tablet Take 10 mg by mouth at bedtime.    [provider]  Multiple Vitamin (MULTIVITAMIN WITH MINERALS) TABS tablet Take 1 tablet by mouth daily. 03/29/18   Elgergawy, Leana Roe, MD  rosuvastatin (CRESTOR) 20 MG tablet Take 20 mg by mouth at bedtime.    [provider]  tamsulosin (FLOMAX) 0.4 MG CAPS capsule Take 0.4 mg by mouth daily.    [provider]  thiamine 100 MG tablet Take 1 tablet (100 mg total) by mouth daily. 03/29/18   Elgergawy, Leana Roe, MD      Allergies    Patient has no known allergies.    Review of Systems   Review of Systems  Gastrointestinal:  Positive for vomiting.    Physical Exam Updated Vital Signs BP (!) 150/79   Pulse 83   Temp 97.8 F (36.6 C)   Resp (!) 24   Ht 5\' 5"  (1.651 m)   Wt 69.4 kg   SpO2 95%   BMI 25.46 kg/m  Physical Exam Vitals and nursing note reviewed.  Constitutional:      General: He is not in acute distress.    Appearance: He is not ill-appearing, toxic-appearing or diaphoretic.  HENT:     Head: Normocephalic and atraumatic.  Eyes:     General: No scleral icterus.    Conjunctiva/sclera: Conjunctivae normal.  Cardiovascular:     Rate and Rhythm: Normal rate and regular rhythm.     Pulses: Normal pulses.     Heart sounds: Normal heart sounds.  Pulmonary:     Effort: Pulmonary effort is normal. No respiratory distress.     Breath sounds: Normal breath sounds. No wheezing or rales.  Abdominal:     General: Abdomen is flat. Bowel sounds are normal. There is no distension.     Palpations: Abdomen is soft. There is no mass.     Tenderness: There is no  abdominal tenderness.  Skin:    General: Skin is warm and dry.     Findings: No lesion.  Neurological:     General: No focal deficit present.     Mental Status: He is alert and oriented to person, place, and time. Mental status is at baseline.     Motor: Weakness present.     Comments: Patient is able to follow commands.  Patient unable to support right leg up against gravity.  Unable to initiate any movement with right upper extremity. Patient aphasic thus limiting neurological exam.  Strong dorsal pedal pulse bilaterally.     ED Results / Procedures / Treatments   Labs (all labs ordered are listed, but only abnormal results are displayed) Labs Reviewed  COMPREHENSIVE METABOLIC PANEL - Abnormal; Notable for the following components:      Result Value   Glucose, Bld 117 (*)    BUN 5 (*)    Creatinine, Ser 0.56 (*)    Total Protein 8.7 (*)    Alkaline Phosphatase 241 (*)    Anion gap 17 (*)    All other components within normal limits  CBG MONITORING, ED - Abnormal; Notable for the following components:   Glucose-Capillary 107 (*)    All other components within normal limits  CBC  LIPASE, BLOOD  URINALYSIS, ROUTINE W REFLEX MICROSCOPIC    EKG None  Radiology No results found.  Procedures Procedures    Medications Ordered in ED Medications - No data to display  ED Course/ Medical Decision Making/ A&P                                 Medical Decision Making Amount and/or Complexity of Data Reviewed Labs: ordered. Radiology: ordered.  Risk Decision regarding hospitalization.   This patient presents to the ED for concern of AMS, this involves an extensive number of treatment options, and is a complaint that carries with it a high risk of complications and morbidity.  The differential diagnosis includes UTI, dehydration, electrolyte abnormalities, stroke, metastasis to brain    Co morbidities that complicate the patient evaluation  COPD, prostate cancer,  history of alcohol abuse, hypertension, CVA   Additional history obtained:  Additional history obtained from family at bedside   Lab Tests:  I personally interpreted labs.  The pertinent results include:   Lactic 2.2  BG 107 CMP evaded alk phos, anion gap of 17 Lipase within normal limits CBC within normal limits, no elevated white blood cell count no anemia.   Imaging Studies ordered:  I ordered imaging studies including chest x-ray and CT head I independently visualized and interpreted imaging which showed no acute intercranial abnormalities, chest x-ray shows no acute consolidation or findings I agree with the radiologist interpretation   Cardiac Monitoring: / EKG:  The patient was maintained on a cardiac monitor.  I personally viewed and interpreted the cardiac monitored which showed an underlying rhythm of: sinus   Consultations Obtained:  I requested consultation with the neurology and hospitalist,  and discussed lab and imaging findings as well as pertinent plan - they recommend: Keppra, valproate, Ativan and admission.  Did not feel that additional imaging of the brain would be needed at this time.  Recommended EEG   Problem List / ED Course / Critical interventions / Medication management  Reported to the emergency room after altered mental status.  On physical exam patient is able to follow commands however he is not able to clearly speak, clearly aphasic.  He also has some right sided lower extremity and right upper extremity weakness.  He does have right-sided extremity weakness at baseline.  But this is worse than normal.  Givne findings, head CT was performed.  Labs showed elevated lactic of 2.2, cmp anion gap of 17 electrolytes within normal limits, otherwise labs were unremarkable.  CBG 107.   I ordered medication including NS  Reevaluation of the patient after these medicines showed that the patient stayed the same I have reviewed the patients home medicines  and have made adjustments as needed   Plan  Admit and consult to neurology          Final Clinical Impression(s) / ED Diagnoses Final diagnoses:  None    Rx / DC Orders ED Discharge Orders     None         Reinaldo Raddle 07/08/23 2122    Derwood Kaplan, MD 07/09/23 1152

## 2023-07-08 NOTE — ED Notes (Signed)
Patient is being discharged from the Urgent Care and sent to the Emergency Department via POV with family. Per Jolene Provost, NP, patient is in need of higher level of care due to unable to walk, possible stroke. Patient is aware and verbalizes understanding of plan of care.  Vitals:   07/08/23 1128  BP: (!) 148/83  Pulse: 100  Resp: 18  Temp: 97.8 F (36.6 C)  SpO2: 94%

## 2023-07-08 NOTE — Hospital Course (Signed)
Walking with a cane at baseline  LKW = 12:00 yesterday; able to communicate more effectively up until yesterday   9/29 -disoriented per RN -able to follow some commands (ie open mouth)  -left arm unclear if pain

## 2023-07-08 NOTE — ED Triage Notes (Signed)
Pt came in via POV with daughter d/t her worries that she noticed he could not walk like he usually can last night. He does have residual weakness/deficit from a stroke this past April (2024) & this morning she also noticed pt was having sever pain d/t spasms in that right arm. Daughter reports he was A/Ox3 before but now could follow commands & express himself. Now he is crying out for his mother, not remembering she has passed & not following commands. Pt vomited after daughter crushed his meds for him & gave him a piece of toast & coffee this morning as well.

## 2023-07-08 NOTE — ED Provider Notes (Signed)
Patient accompanied by family.  Family states that he is having muscle spasms over the right arm.  With increased pain.  Patient had major stroke in April with loss of use of right arm.  Family states he was walking last night but is not walking or using the right leg this morning.  They are reporting vomiting as well.  Patient is baseline aphasic from stroke.  I am concerned for stroke symptoms am patient and family are advised to got directly to ER for stroke evaluation.  Family is agreeable and will transport.     Nelda Marseille, NP 07/08/23 [phone]

## 2023-07-08 NOTE — Progress Notes (Signed)
LTM EEG running - no initial skin breakdown - push button tested - neuro notified.  

## 2023-07-09 DIAGNOSIS — C61 Malignant neoplasm of prostate: Secondary | ICD-10-CM | POA: Diagnosis not present

## 2023-07-09 DIAGNOSIS — C20 Malignant neoplasm of rectum: Secondary | ICD-10-CM | POA: Diagnosis not present

## 2023-07-09 DIAGNOSIS — G40901 Epilepsy, unspecified, not intractable, with status epilepticus: Secondary | ICD-10-CM

## 2023-07-09 LAB — BASIC METABOLIC PANEL
Anion gap: 12 (ref 5–15)
BUN: 5 mg/dL — ABNORMAL LOW (ref 8–23)
CO2: 25 mmol/L (ref 22–32)
Calcium: 9.2 mg/dL (ref 8.9–10.3)
Chloride: 103 mmol/L (ref 98–111)
Creatinine, Ser: 0.69 mg/dL (ref 0.61–1.24)
GFR, Estimated: >60 mL/min (ref >60)
Glucose, Bld: 127 mg/dL — ABNORMAL HIGH (ref 70–99)
Potassium: 3.6 mmol/L (ref 3.5–5.1)
Sodium: 140 mmol/L (ref 135–145)

## 2023-07-09 LAB — CBC
HCT: 38.4 % — ABNORMAL LOW (ref 39.0–52.0)
Hemoglobin: 12.3 g/dL — ABNORMAL LOW (ref 13.0–17.0)
MCH: 26.8 pg (ref 26.0–34.0)
MCHC: 32 g/dL (ref 30.0–36.0)
MCV: 83.7 fL (ref 80.0–100.0)
Platelets: 299 10*3/uL (ref 150–400)
RBC: 4.59 MIL/uL (ref 4.22–5.81)
RDW: 14 % (ref 11.5–15.5)
WBC: 10.4 10*3/uL (ref 4.0–10.5)
nRBC: 0 % (ref 0.0–0.2)

## 2023-07-09 MED ORDER — VALPROATE SODIUM 100 MG/ML IV SOLN
250.0000 mg | Freq: Four times a day (QID) | INTRAVENOUS | Status: DC
  Administered 2023-07-09 – 2023-07-11 (×9): 250 mg via INTRAVENOUS
  Filled 2023-07-09: qty 2.5
  Filled 2023-07-09: qty 250
  Filled 2023-07-09 (×10): qty 2.5
  Filled 2023-07-09: qty 250
  Filled 2023-07-09: qty 2.5
  Filled 2023-07-09: qty 250

## 2023-07-09 MED ORDER — ACETAMINOPHEN 325 MG PO TABS
650.0000 mg | ORAL_TABLET | Freq: Four times a day (QID) | ORAL | Status: DC | PRN
  Administered 2023-07-09: 650 mg via ORAL
  Filled 2023-07-09: qty 2

## 2023-07-09 MED ORDER — SODIUM CHLORIDE 0.9 % IV SOLN
15.0000 mg/kg | Freq: Once | INTRAVENOUS | Status: AC
  Administered 2023-07-09: 1041 mg via INTRAVENOUS
  Filled 2023-07-09: qty 20.82

## 2023-07-09 NOTE — Progress Notes (Signed)
ATRIUM MONITORING. Moved to new room

## 2023-07-09 NOTE — Plan of Care (Signed)

## 2023-07-09 NOTE — Progress Notes (Addendum)
HD#1 SUBJECTIVE:  Patient Summary: Brian Dudley is a 62 y.o. male with a pertinent PMH of recent left MCA CVA c/b seizures, Stage IV prostate cancer, history of stage III rectosigmoid cancer s/p resection, HTN, and BPH who presented with worsening aphasia, weakness, and right arm twitching and is admitted for focal status epilepticus.   Overnight Events: None  Interim History: Patient was evaluated at bedside. He responds to commands but his verbal responses are not appropriate. On LTM EEG. No arm twitching noted.   OBJECTIVE:  Vital Signs: Vitals:   07/09/23 0343 07/09/23 0754 07/09/23 0852 07/09/23 1129  BP: 103/63 102/60  115/66  Pulse: 86 (!) 101  96  Resp: 18 18  17   Temp: 98.1 F (36.7 C) 98.2 F (36.8 C)  99 F (37.2 C)  TempSrc:  Oral  Oral  SpO2: 100% 97% 97% 100%  Weight:      Height:       Supplemental O2: Room Air SpO2: 100 %  Filed Weights   07/08/23 1230  Weight: 69.4 kg     Intake/Output Summary (Last 24 hours) at 07/09/2023 1141 Last data filed at 07/08/2023 2305 Gross per 24 hour  Intake 250 ml  Output 350 ml  Net -100 ml   Net IO Since Admission: -100 mL [07/09/23 1141]  Physical Exam: General: laying in bed, in NAD Cardiac: RRR, no m/r/g Pulmonary: CTA bilaterally, normal effort Abdomen: soft, non-tender, non-distended MSK: normal bulk/tone, decreased ROM and strength on the right Neuro: alert, unable to assess orientation given aphasia; speech minimal and inappropriate; no arm twitching noted; no new focal deficits appreciated  Patient Lines/Drains/Airways Status     Active Line/Drains/Airways     Name Placement date Placement time Site Days   Peripheral IV 07/08/23 22 G Antecubital 07/08/23  1514  Antecubital  1            Pertinent Labs:    Latest Ref Rng & Units 07/08/2023   12:45 PM 05/30/2023   12:37 AM 05/26/2023    3:57 AM  CBC  WBC 4.0 - 10.5 K/uL 10.3  13.0  7.2   Hemoglobin 13.0 - 17.0 g/dL 24.4  01.0  27.2    Hematocrit 39.0 - 52.0 % 42.2  38.9  36.3   Platelets 150 - 400 K/uL 292  358  431        Latest Ref Rng & Units 07/08/2023   12:45 PM 05/30/2023   12:37 AM 05/26/2023    3:57 AM  CMP  Glucose 70 - 99 mg/dL 536  644  034   BUN 8 - 23 mg/dL 5  <5  <5   Creatinine 0.61 - 1.24 mg/dL 7.42  5.95  6.38   Sodium 135 - 145 mmol/L 140  133  138   Potassium 3.5 - 5.1 mmol/L 3.8  4.6  4.2   Chloride 98 - 111 mmol/L 100  99  105   CO2 22 - 32 mmol/L 23  24  26    Calcium 8.9 - 10.3 mg/dL 9.7  9.1  9.1   Total Protein 6.5 - 8.1 g/dL 8.7     Total Bilirubin 0.3 - 1.2 mg/dL 0.3     Alkaline Phos 38 - 126 U/L 241     AST 15 - 41 U/L 21     ALT 0 - 44 U/L 12       Recent Labs    07/08/23 1347  GLUCAP 107*     Pertinent  Imaging: Overnight EEG with video  Result Date: 07/09/2023 Charlsie Quest, MD     07/09/2023  8:24 AM Patient Name: Brian Dudley MRN: [id number] Epilepsy Attending: Charlsie Quest Referring Physician/Provider: Erick Blinks, MD Duration: 07/08/2023 2209 to 07/09/2023 0815 Patient history: 62 y.o. male with hx of prior L MCA stroke c/b seizures on Keppra 1000mg  BID with recent EEG showing L posterior quadrant spikes, who presents with aphasia out of proportion to confusion and intermittent RUE twitching. EEG to evaluate for seizure Level of alertness: Awake, asleep AEDs during EEG study: LEV, VPA, Ativan Technical aspects: This EEG study was done with scalp electrodes positioned according to the 10-20 International system of electrode placement. Electrical activity was reviewed with band pass filter of 1-70Hz , sensitivity of 7 uV/mm, display speed of 33mm/sec with a 60Hz  notched filter applied as appropriate. EEG data were recorded continuously and digitally stored.  Video monitoring was available and reviewed as appropriate. Description: The posterior dominant rhythm consists of 8 Hz activity of moderate voltage (25-35 uV) seen predominantly in posterior head regions,  asymmetric ( left<right) and reactive to eye opening and eye closing. Sleep was characterized by sleep spindles (12 to 15 Hz), maximal frontocentral region. EEG showed continuous 3 to 5 Hz theta-delta slowing in left hemisphere as well as intermittent generalized 3-6hz  theta-delta slowing. Lateralized periodic discharges with overriding fast activity were noted in left hemisphere, maximal left posterior quadrant at 1Hz . Clinically, patient had right upper extremity jerking per Dr Derry Lory ( difficult to see on camera). 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 Patient was loaded with IV Valproic acid on 07/08/2023 at 2315. Subsequently clinical jerking resolved. EEG continued to show lateralized periodic discharges with overriding fast activity were noted in left hemisphere, maximal left posterior quadrant at 1Hz .  ABNORMALITY - Focal convulsive status epilepticus, left posterior quadrant. - Lateralized periodic discharges with overriding fast activity, left hemisphere, maximal left posterior quadrant ( LPD+F) - Continuous slow, left hemisphere - Intermittent slow, generalized - Background asymmetry, left<right  IMPRESSION: At the beginning of the study, patient reportedly had right upper extremity jerking. 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  Patient was loaded with IV Valproic acid on 07/08/2023 at 2315. Subsequently clinical jerking resolved. EEG continued to show evidence of epileptogenicity arising from left hemisphere, maximal left posterior quadrant with increased risk of seizure recurrence. Additionally there was cortical dysfunction arising from left hemisphere likely secondary to underlying stroke. Lastly there was moderate diffuse encephalopathy.  Charlsie Quest   CT Head Wo Contrast  Result Date:  07/08/2023 CLINICAL DATA:  Mental status changes. EXAM: CT HEAD WITHOUT CONTRAST TECHNIQUE: Contiguous axial images were obtained from the base of the skull through the vertex without intravenous contrast. RADIATION DOSE REDUCTION: This exam was performed according to the departmental dose-optimization program which includes automated exposure control, adjustment of the mA and/or kV according to patient size and/or use of iterative reconstruction technique. COMPARISON:  MRI brain 07/05/2023 FINDINGS: Brain: There is no evidence for acute hemorrhage, hydrocephalus, mass lesion, or abnormal extra-axial fluid collection. No definite CT evidence for acute infarction. Patchy low attenuation in the deep hemispheric and periventricular white matter is nonspecific, but likely reflects chronic microvascular ischemic demyelination. Old left MCA territory infarct again noted. Vascular: No hyperdense vessel or unexpected calcification. Skull: No evidence for fracture. No worrisome lytic or sclerotic lesion. Sinuses/Orbits:  The visualized paranasal sinuses and mastoid air cells are clear. Visualized portions of the globes and intraorbital fat are unremarkable. Other: None. IMPRESSION: 1. No acute intracranial abnormality. 2. Old left MCA territory infarct. 3. Chronic small vessel white matter ischemic disease. Electronically Signed   By: Kennith Center M.D.   On: 07/08/2023 16:31   DG Chest Portable 1 View  Result Date: 07/08/2023 CLINICAL DATA:  Altered mental status. EXAM: PORTABLE CHEST 1 VIEW COMPARISON:  Chest radiograph dated 05/24/2023. FINDINGS: The heart size and mediastinal contours are within normal limits. Mild left basilar atelectasis/airspace disease. No significant pleural effusion or pneumothorax. Degenerative changes are seen in the spine. IMPRESSION: Mild left basilar atelectasis/airspace disease. Electronically Signed   By: Romona Curls M.D.   On: 07/08/2023 14:49    ASSESSMENT/PLAN:   Assessment: Principal Problem:   Status epilepticus (HCC) Active Problems:   COPD (chronic obstructive pulmonary disease) (HCC)   HLD (hyperlipidemia)   BPH (benign prostatic hyperplasia)   Prostate cancer (HCC)   Hypertension   Rectal cancer (HCC)  Plan: Focal Convulsive Status Epilepticus  History of L MCA CVA Neurology following, on long term monitoring EEG. Takes Keppra 1000 mg BID at home and reportedly had not missed any doses. Currently on depakote 250 mg IV q6h, Keppra 1500 mg BID, and fosphenytoin. -Neurology following, appreciate recs -Continue LTM EEG today -Swallow study today; can transition to oral meds once he can swallow -Continue depakote 250 mg IV q6h, Keppra 1500 mg BID, and fosphenytoin  -Continue thiamine -Seizure precautions, neurochecks q2h  Chronic conditions: PO home meds held pending swallow eval Metastatic Prostate Cancer- follows outpatient with Dr. Cherly Hensen. Previous history of radiation and ADT/ARPI, known metastasis to lymph nodes/bones. Has follow up on 10/3 with repeat pet scan.  HTN- on amlodopine 5 mg daily, metoprolol 50 mg BID Hx CVA, HLD- plavix 75 mg, ASA 81 mg daily, rosuvastatin 20 mg daily BPH- tamsulosin 0.4 mg daily COPD- montelukast 10 mg nightly, trelegy ellipta  Best Practice: Diet: NPO IVF: Fluids: None VTE: enoxaparin (LOVENOX) injection 40 mg Start: 07/08/23 2100 Code: Full AB: None DISPO: Anticipated discharge in 3-5 days pending medical workup.  Signature: Annett Fabian, MD  Internal Medicine Resident, PGY-1 [facility]  Pager: [phone] 11:41 AM, 07/09/2023   Please contact the on call pager after 5 pm and on weekends at [phone].

## 2023-07-09 NOTE — Progress Notes (Signed)
LTM maint complete - no skin breakdown under: F3, F8

## 2023-07-09 NOTE — Plan of Care (Signed)

## 2023-07-09 NOTE — Progress Notes (Signed)
Initial HIP:  Brian Dudley is a 62 y.o. male with hx of prior L MCA stroke c/b seizures on Keppra 1000mg  BID with recent EEG showing L posterior quadrant spikes, hx of COPD, CVA, HTN, BPH, prostate cancer stage IV, history of stage III rectosigmoid cancer who presents with aphasia out of proportion to confusion and intermittent RUE twitching.   He is unable to provide any history secondary to aphasia.   Daughter reported that yesterday morning patient was at his baseline.  He got up, make coffee and sat down in the recliner in the living room.  In the afternoon, they noted that he stayed in the recliner and has not gotten up.  When family helped him get to the bathroom, they noted that he was having significant trouble with walking.  He was unable to get up.  They put briefs on him.  They were able to get him to eat for dinner.  They use wheelchair to transfer him to the bed and patient was able to help with the transfer.  They noted that he had taken his short off and he was covered in vomit.  This is very unusual for him.  They also report that last night, he was screaming loud in his right arm look like it was doing some twitches.  He was using his left hand to pick up his right arm.   Family called EMS and he was brought into the ED.  He was noted to have intermittent right upper extremity twitching and neurology was consulted for further evaluation and workup. He has not missed any doses of Keppra. No recent sickness.  Subjective: Interval History:  No family at bedside. Non verbal.   Objective: Vital signs in last 24 hours: Temp:  [97.8 F (36.6 C)-98.9 F (37.2 C)] 98.2 F (36.8 C) (09/29 0754) Pulse Rate:  [72-102] 101 (09/29 0754) Resp:  [18-31] 18 (09/29 0754) BP: (102-159)/(60-116) 102/60 (09/29 0754) SpO2:  [94 %-100 %] 97 % (09/29 0852) Weight:  [69.4 kg] 69.4 kg (09/28 1230)   Gen: NAD. Sitting in bed, appears comfortable. Non verbal. HEETN: [place]/AT. Resp: non labored.    Neuro: alert, awake. Looking around, tracks examiner. Will nod yes when his name is called out.  He will not tell me his name. Will intermittency follow simple commands but only with repetition. Aphasic.  CN: PERRL, no forced gaze. Blink to threat b/l.  Tongue midline.  Motor: non focal. Will react to painful stimuli  in all 4 ext.      Lab Results: Recent Labs    07/08/23 1245  WBC 10.3  HGB 14.1  HCT 42.2  PLT 292  NA 140  K 3.8  CL 100  CO2 23  GLUCOSE 117*  BUN 5*  CREATININE 0.56*  CALCIUM 9.7   Lipid Panel No results for input(s): "CHOL", "TRIG", "HDL", "CHOLHDL", "VLDL", "LDLCALC" in the last 72 hours.  Studies/Results: Overnight EEG with video  Result Date: 07/09/2023 Brian Quest, MD     07/09/2023  8:24 AM Patient Name: Brian Dudley MRN: [id number] Epilepsy Attending: [name] Referring Physician/Provider: Erick Blinks, MD Duration: 07/08/2023 2209 to 07/09/2023 0815 Patient history: 62 y.o. male with hx of prior L MCA stroke c/b seizures on Keppra 1000mg  BID with recent EEG showing L posterior quadrant spikes, who presents with aphasia out of proportion to confusion and intermittent RUE twitching. EEG to evaluate for seizure Level of alertness: Awake, asleep AEDs during EEG study: LEV, VPA,  Ativan Technical aspects: This EEG study was done with scalp electrodes positioned according to the 10-20 International system of electrode placement. Electrical activity was reviewed with band pass filter of 1-70Hz , sensitivity of 7 uV/mm, display speed of 53mm/sec with a 60Hz  notched filter applied as appropriate. EEG data were recorded continuously and digitally stored.  Video monitoring was available and reviewed as appropriate. Description: The posterior dominant rhythm consists of 8 Hz activity of moderate voltage (25-35 uV) seen predominantly in posterior head regions, asymmetric ( left<right) and reactive to eye opening and eye closing. Sleep was  characterized by sleep spindles (12 to 15 Hz), maximal frontocentral region. EEG showed continuous 3 to 5 Hz theta-delta slowing in left hemisphere as well as intermittent generalized 3-6hz  theta-delta slowing. Lateralized periodic discharges with overriding fast activity were noted in left hemisphere, maximal left posterior quadrant at 1Hz . Clinically, patient had right upper extremity jerking per Dr Derry Lory ( difficult to see on camera). 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 Patient was loaded with IV Valproic acid on 07/08/2023 at 2315. Subsequently clinical jerking resolved. EEG continued to show lateralized periodic discharges with overriding fast activity were noted in left hemisphere, maximal left posterior quadrant at 1Hz .  ABNORMALITY - Focal convulsive status epilepticus, left posterior quadrant. - Lateralized periodic discharges with overriding fast activity, left hemisphere, maximal left posterior quadrant ( LPD+F) - Continuous slow, left hemisphere - Intermittent slow, generalized - Background asymmetry, left<right  IMPRESSION: At the beginning of the study, patient reportedly had right upper extremity jerking. 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  Patient was loaded with IV Valproic acid on 07/08/2023 at 2315. Subsequently clinical jerking resolved. EEG continued to show evidence of epileptogenicity arising from left hemisphere, maximal left posterior quadrant with increased risk of seizure recurrence. Additionally there was cortical dysfunction arising from left hemisphere likely secondary to underlying stroke. Lastly there was moderate diffuse encephalopathy.  Brian Dudley   CT Head Wo Contrast  Result Date: 07/08/2023 CLINICAL DATA:  Mental status changes. EXAM: CT HEAD WITHOUT CONTRAST TECHNIQUE:  Contiguous axial images were obtained from the base of the skull through the vertex without intravenous contrast. RADIATION DOSE REDUCTION: This exam was performed according to the departmental dose-optimization program which includes automated exposure control, adjustment of the mA and/or kV according to patient size and/or use of iterative reconstruction technique. COMPARISON:  MRI brain 07/05/2023 FINDINGS: Brain: There is no evidence for acute hemorrhage, hydrocephalus, mass lesion, or abnormal extra-axial fluid collection. No definite CT evidence for acute infarction. Patchy low attenuation in the deep hemispheric and periventricular white matter is nonspecific, but likely reflects chronic microvascular ischemic demyelination. Old left MCA territory infarct again noted. Vascular: No hyperdense vessel or unexpected calcification. Skull: No evidence for fracture. No worrisome lytic or sclerotic lesion. Sinuses/Orbits: The visualized paranasal sinuses and mastoid air cells are clear. Visualized portions of the globes and intraorbital fat are unremarkable. Other: None. IMPRESSION: 1. No acute intracranial abnormality. 2. Old left MCA territory infarct. 3. Chronic small vessel white matter ischemic disease. Electronically Signed   By: Kennith Center M.D.   On: 07/08/2023 16:31   DG Chest Portable 1 View  Result Date: 07/08/2023 CLINICAL DATA:  Altered mental status. EXAM: PORTABLE CHEST 1 VIEW COMPARISON:  Chest radiograph dated 05/24/2023. FINDINGS: The heart size and mediastinal contours are within normal limits. Mild left basilar atelectasis/airspace  disease. No significant pleural effusion or pneumothorax. Degenerative changes are seen in the spine. IMPRESSION: Mild left basilar atelectasis/airspace disease. Electronically Signed   By: Romona Curls M.D.   On: 07/08/2023 14:49    Medications: Scheduled:  enoxaparin (LOVENOX) injection  40 mg Subcutaneous Q24H   fluticasone furoate-vilanterol  1 puff  Inhalation Daily   And   umeclidinium bromide  1 puff Inhalation Daily   levETIRAcetam  1,500 mg Oral BID   thiamine (VITAMIN B1) injection  100 mg Intravenous Daily   Continuous:  fosPHENYtoin (CEREBYX) IV     levETIRAcetam 1,500 mg (07/09/23 0831)   valproate sodium     PRN:  9/28-9/29 LTM: IMPRESSION: At the beginning of the study, patient reportedly had right upper extremity jerking. 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     Patient was loaded with IV Valproic acid on 07/08/2023 at 2315. Subsequently clinical jerking resolved. EEG continued to show evidence of epileptogenicity arising from left hemisphere, maximal left posterior quadrant with increased risk of seizure recurrence.    Additionally there was cortical dysfunction arising from left hemisphere likely secondary to underlying stroke. Lastly there was moderate diffuse encephalopathy.  Assessment/Plan: 62 year old with history of left MCA stroke and seizures on Keppra 1000 mg twice daily at baseline, history of hypertension prostate cancer.  Had right upper extremity twitching concerning for focal seizure.  Loaded with fosphenytoin but finally broke with Depakote load.  Impression: Status focal convulsive epilepticus arising from left posterior quadrant-improving  Plan: Continue Depakote 250mg  IV q 6hrs. can transition to p.o. when he is able to swallow. Continue Keppra 1,500mg  BID Another day of LTM to assess for stability due to increased risk for seizure recurrence on LTM. Neurology will continue to follow.   LOS: 1 day   Nicollette Wilhelmi M Jenasis Straley

## 2023-07-09 NOTE — Procedures (Signed)
Patient Name: Brian Dudley  MRN: [id number]  Epilepsy Attending: Charlsie Quest  Referring Physician/Provider: Erick Blinks, MD  Duration: 07/08/2023 2209 to 07/09/2023 2209  Patient history: 62 y.o. male with hx of prior L MCA stroke c/b seizures on Keppra 1000mg  BID with recent EEG showing L posterior quadrant spikes, who presents with aphasia out of proportion to confusion and intermittent RUE twitching. EEG to evaluate for seizure  Level of alertness: Awake, asleep  AEDs during EEG study: LEV, VPA, Ativan  Technical aspects: This EEG study was done with scalp electrodes positioned according to the 10-20 International system of electrode placement. Electrical activity was reviewed with band pass filter of 1-70Hz , sensitivity of 7 uV/mm, display speed of 42mm/sec with a 60Hz  notched filter applied as appropriate. EEG data were recorded continuously and digitally stored.  Video monitoring was available and reviewed as appropriate.  Description: The posterior dominant rhythm consists of 8 Hz activity of moderate voltage (25-35 uV) seen predominantly in posterior head regions, asymmetric ( left<right) and reactive to eye opening and eye closing. Sleep was characterized by sleep spindles (12 to 15 Hz), maximal frontocentral region. EEG showed continuous 3 to 5 Hz theta-delta slowing in left hemisphere as well as intermittent generalized 3-6hz  theta-delta slowing.  Lateralized periodic discharges with overriding fast activity were noted in left hemisphere, maximal left posterior quadrant at 1Hz . Clinically, patient had right upper extremity jerking per Dr Derry Lory ( difficult to see on camera). 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 Patient was loaded with IV Valproic acid on 07/08/2023 at 2315. Subsequently clinical jerking resolved. EEG continued to show lateralized periodic discharges with overriding fast activity were  noted in left hemisphere, maximal left posterior quadrant at 1-1.5Hz .    ABNORMALITY - Focal convulsive status epilepticus, left posterior quadrant. - Lateralized periodic discharges with overriding fast activity, left hemisphere, maximal left posterior quadrant ( LPD+F) - Continuous slow, left hemisphere - Intermittent slow, generalized - Background asymmetry, left<right   IMPRESSION: At the beginning of the study, patient reportedly had right upper extremity jerking. 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    Patient was loaded with IV Valproic acid on 07/08/2023 at 2315. Subsequently clinical jerking resolved. EEG continued to show evidence of epileptogenicity arising from left hemisphere, maximal left posterior quadrant with increased risk of seizure recurrence.   Additionally there was cortical dysfunction arising from left hemisphere likely secondary to underlying stroke. Lastly there was moderate diffuse encephalopathy.   [name]

## 2023-07-10 DIAGNOSIS — G40901 Epilepsy, unspecified, not intractable, with status epilepticus: Secondary | ICD-10-CM | POA: Diagnosis not present

## 2023-07-10 DIAGNOSIS — R4182 Altered mental status, unspecified: Secondary | ICD-10-CM | POA: Diagnosis not present

## 2023-07-10 DIAGNOSIS — R531 Weakness: Secondary | ICD-10-CM | POA: Diagnosis not present

## 2023-07-10 LAB — COMPREHENSIVE METABOLIC PANEL
ALT: 10 U/L (ref 0–44)
AST: 16 U/L (ref 15–41)
Albumin: 3 g/dL — ABNORMAL LOW (ref 3.5–5.0)
Alkaline Phosphatase: 214 U/L — ABNORMAL HIGH (ref 38–126)
Anion gap: 9 (ref 5–15)
BUN: 5 mg/dL — ABNORMAL LOW (ref 8–23)
CO2: 23 mmol/L (ref 22–32)
Calcium: 8.9 mg/dL (ref 8.9–10.3)
Chloride: 104 mmol/L (ref 98–111)
Creatinine, Ser: 0.78 mg/dL (ref 0.61–1.24)
GFR, Estimated: >60 mL/min (ref >60)
Glucose, Bld: 78 mg/dL (ref 70–99)
Potassium: 3.4 mmol/L — ABNORMAL LOW (ref 3.5–5.1)
Sodium: 136 mmol/L (ref 135–145)
Total Bilirubin: 0.7 mg/dL (ref 0.3–1.2)
Total Protein: 7.2 g/dL (ref 6.5–8.1)

## 2023-07-10 LAB — CBC
HCT: 41 % (ref 39.0–52.0)
Hemoglobin: 13.2 g/dL (ref 13.0–17.0)
MCH: 27.3 pg (ref 26.0–34.0)
MCHC: 32.2 g/dL (ref 30.0–36.0)
MCV: 84.7 fL (ref 80.0–100.0)
Platelets: 310 10*3/uL (ref 150–400)
RBC: 4.84 MIL/uL (ref 4.22–5.81)
RDW: 13.8 % (ref 11.5–15.5)
WBC: 9.4 10*3/uL (ref 4.0–10.5)
nRBC: 0 % (ref 0.0–0.2)

## 2023-07-10 LAB — VALPROIC ACID LEVEL: Valproic Acid Lvl: 85 ug/mL (ref 50.0–100.0)

## 2023-07-10 MED ORDER — CLOPIDOGREL BISULFATE 75 MG PO TABS
75.0000 mg | ORAL_TABLET | Freq: Every day | ORAL | Status: DC
  Administered 2023-07-10 – 2023-07-13 (×4): 75 mg via ORAL
  Filled 2023-07-10 (×4): qty 1

## 2023-07-10 MED ORDER — ASPIRIN 81 MG PO TBEC
81.0000 mg | DELAYED_RELEASE_TABLET | Freq: Every day | ORAL | Status: DC
  Administered 2023-07-10 – 2023-07-13 (×4): 81 mg via ORAL
  Filled 2023-07-10 (×4): qty 1

## 2023-07-10 MED ORDER — SODIUM CHLORIDE 0.9 % IV SOLN
2000.0000 mg | Freq: Two times a day (BID) | INTRAVENOUS | Status: DC
  Administered 2023-07-10 – 2023-07-11 (×3): 2000 mg via INTRAVENOUS
  Filled 2023-07-10: qty 20
  Filled 2023-07-10: qty 15
  Filled 2023-07-10 (×2): qty 20

## 2023-07-10 MED ORDER — GUAIFENESIN ER 600 MG PO TB12
600.0000 mg | ORAL_TABLET | Freq: Two times a day (BID) | ORAL | Status: AC
  Administered 2023-07-10 (×2): 600 mg via ORAL
  Filled 2023-07-10 (×2): qty 1

## 2023-07-10 MED ORDER — ROSUVASTATIN CALCIUM 20 MG PO TABS
20.0000 mg | ORAL_TABLET | Freq: Every day | ORAL | Status: DC
  Administered 2023-07-10 – 2023-07-12 (×3): 20 mg via ORAL
  Filled 2023-07-10 (×3): qty 1

## 2023-07-10 MED ORDER — FOLIC ACID 1 MG PO TABS
1.0000 mg | ORAL_TABLET | Freq: Every day | ORAL | Status: DC
  Administered 2023-07-10 – 2023-07-13 (×4): 1 mg via ORAL
  Filled 2023-07-10 (×4): qty 1

## 2023-07-10 MED ORDER — MONTELUKAST SODIUM 10 MG PO TABS
10.0000 mg | ORAL_TABLET | Freq: Every day | ORAL | Status: DC
  Administered 2023-07-10 – 2023-07-12 (×3): 10 mg via ORAL
  Filled 2023-07-10 (×3): qty 1

## 2023-07-10 MED ORDER — METOPROLOL TARTRATE 50 MG PO TABS
50.0000 mg | ORAL_TABLET | Freq: Two times a day (BID) | ORAL | Status: DC
  Administered 2023-07-10 – 2023-07-13 (×7): 50 mg via ORAL
  Filled 2023-07-10 (×7): qty 1

## 2023-07-10 MED ORDER — POTASSIUM CHLORIDE CRYS ER 20 MEQ PO TBCR
20.0000 meq | EXTENDED_RELEASE_TABLET | Freq: Two times a day (BID) | ORAL | Status: DC
  Administered 2023-07-10 – 2023-07-13 (×7): 20 meq via ORAL
  Filled 2023-07-10 (×7): qty 1

## 2023-07-10 MED ORDER — LEVETIRACETAM 500 MG PO TABS
1500.0000 mg | ORAL_TABLET | Freq: Two times a day (BID) | ORAL | Status: DC
Start: 2023-07-10 — End: 2023-07-10

## 2023-07-10 NOTE — Plan of Care (Signed)

## 2023-07-10 NOTE — Evaluation (Signed)
Physical Therapy Evaluation Patient Details Name: Brian Dudley MRN: [id number] DOB: 04-15-1961 Today's Date: 07/10/2023  History of Present Illness  Pt is a 62 y/o M admitted on 07/08/23 after presenting with c/o worsening aphasia, weakness, & RUE twitching. Pt is admitted for focal status epilepticus. 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 Pt presents with expressive aphasia & impaired ability to follow simple commands. Pt requires max assist for supine<>sit & rolling L<>R in bed. Pt requires max assist for static sitting EOB 2/2 pt pushing R/posteriorly with LUE. Assisted pt with leaning onto L elbow to prevent pushing with pt able to maintain position ~1 minute x 2 times. Pt would benefit from ongoing PT treatment to address balance, strengthening, & activity tolerance to increase independence with bed mobility & transfers.        If plan is discharge home, recommend the following: Two people to help with walking and/or transfers;Assistance with cooking/housework;Direct supervision/assist for medications management;Direct supervision/assist for financial management;Help with stairs or ramp for entrance;Assist for transportation;Supervision due to cognitive status;Two people to help with bathing/dressing/bathroom;Assistance with feeding   Can travel by private vehicle   No    Equipment Recommendations Other (comment) (defer to next venue)  Recommendations for Other Services       Functional Status Assessment Patient has had a recent decline in their functional status and demonstrates the ability to make significant improvements in function in a reasonable and predictable amount of time.     Precautions / Restrictions Precautions Precautions: Fall Precaution Comments: continuous  EEG Restrictions Weight Bearing Restrictions: No      Mobility  Bed Mobility Overal bed mobility: Needs Assistance Bed Mobility: Rolling Rolling: Max assist, Total assist, +2 for physical assistance, Used rails, +2 for safety/equipment   Supine to sit: Max assist, HOB elevated, Used rails Sit to supine: Max assist, HOB elevated, Used rails   General bed mobility comments: Pt with poor ability to follow simple cuing throughout mobility.    Transfers                        Ambulation/Gait                  Stairs            Wheelchair Mobility     Tilt Bed    Modified Rankin (Stroke Patients Only)       Balance Overall balance assessment: Needs assistance Sitting-balance support: Single extremity supported, Feet supported Sitting balance-Leahy Scale: Zero Sitting balance - Comments: Pt pushing R with LUE. PT positions LUE in flexed elbow resting on bed to prevent pushing R with pt able to sustain with assistance to also prevent posterior lean. Poor awareness re: impaired balance, absent righting reactions. Postural control: Right lateral lean, Posterior lean                                   Pertinent Vitals/Pain Pain Assessment Pain Assessment: Faces Faces Pain Scale: No hurt    Home Living Family/patient expects to be discharged to:: Unsure                   Additional Comments: Pt unable to provide information, no family  present during session. Per chart, pt was with daughter prior to admission. Nursing staff reports pt had some expressive aphasia at baseline.    Prior Function Prior Level of Function : Patient poor historian/Family not available             Mobility Comments: Per chart, pt mobile in some capacity, making cup of coffee prior to admission.       Extremity/Trunk Assessment   Upper Extremity Assessment RUE Deficits / Details: Pt with functional use of LUE but no active movement noted in RUE.  WFL PROM RUE.    Lower Extremity Assessment Lower Extremity Assessment:  (Pt with 3/5 L knee extension in sitting, 2-/5 R knee extension in sitting.)       Communication   Communication Communication: Difficulty communicating thoughts/reduced clarity of speech Following commands: Follows one step commands inconsistently;Follows one step commands with increased time Cueing Techniques: Verbal cues;Gestural cues;Tactile cues;Visual cues  Cognition Arousal: Alert Behavior During Therapy: Flat affect Overall Cognitive Status: No family/caregiver present to determine baseline cognitive functioning Area of Impairment: Following commands                       Following Commands: Follows one step commands inconsistently, Follows one step commands with increased time   Awareness: Intellectual   General Comments: Pt with very poor cognition, does not follow simple commands even with multimodal cuing. Pt also limited by expressive aphasia.        General Comments General comments (skin integrity, edema, etc.): Pt noted to be incontinent of BM, requires total assist for peri hygiene. RUE placed on pillow for support at end of session. Pt talking throughout session but no coherent, understandable words.    Exercises     Assessment/Plan    PT Assessment Patient needs continued PT services  PT Problem List Decreased strength;Decreased activity tolerance;Decreased balance;Decreased mobility;Decreased cognition;Decreased coordination;Decreased safety awareness;Decreased knowledge of precautions;Decreased knowledge of use of DME;Decreased range of motion       PT Treatment Interventions DME instruction;Gait training;Stair training;Functional mobility training;Therapeutic activities;Therapeutic exercise;Balance training;Neuromuscular re-education;Cognitive remediation;Patient/family education;Wheelchair mobility training;Manual techniques    PT Goals (Current goals can be found in the  Care Plan section)  Acute Rehab PT Goals PT Goal Formulation: Patient unable to participate in goal setting Time For Goal Achievement: 07/24/23 Potential to Achieve Goals: Fair    Frequency Min 1X/week     Co-evaluation               AM-PAC PT "6 Clicks" Mobility  Outcome Measure Help needed turning from your back to your side while in a flat bed without using bedrails?: Total Help needed moving from lying on your back to sitting on the side of a flat bed without using bedrails?: Total Help needed moving to and from a bed to a chair (including a wheelchair)?: Total Help needed standing up from a chair using your arms (e.g., wheelchair or bedside chair)?: Total Help needed to walk in hospital room?: Total Help needed climbing 3-5 steps with a railing? : Total 6 Click Score: 6    End of Session   Activity Tolerance: Patient tolerated treatment well Patient left: in bed;with call bell/phone within reach;with bed alarm set;with nursing/sitter in room Nurse Communication: Mobility status PT Visit Diagnosis: Difficulty in walking, not elsewhere classified (R26.2);Muscle weakness (generalized) (M62.81);Other abnormalities of gait and mobility (R26.89);Unsteadiness on feet (R26.81);Hemiplegia and hemiparesis Hemiplegia - Right/Left: Right Hemiplegia - caused by: Other cerebrovascular disease  Time: 4098-1191 PT Time Calculation (min) (ACUTE ONLY): 12 min   Charges:   PT Evaluation $PT Eval Moderate Complexity: 1 Mod   PT General Charges $$ ACUTE PT VISIT: 1 Visit         Aleda Grana, PT, DPT 07/10/23, 4:16 PM   Sandi Mariscal 07/10/2023, 4:15 PM

## 2023-07-10 NOTE — Progress Notes (Signed)
Review of Initial HPI (from 9/28):  Brian Dudley is a 62 y.o. male with hx of prior L MCA stroke c/b seizures on Keppra 1000 mg BID with recent EEG showing L posterior quadrant spikes, hx of COPD, CVA, HTN, BPH, prostate cancer stage IV, history of stage III rectosigmoid cancer who presents with aphasia out of proportion to confusion and intermittent RUE twitching.   He is unable to provide any history secondary to aphasia.   Daughter reported that 9/27 patient was at his baseline.  He got up, made coffee and sat down in the recliner in the living room.  In the afternoon, they noted that he stayed in the recliner and had not gotten up.  When family helped him get to the bathroom, they noted that he was having significant trouble walking.  He was unable to get up.  They put briefs on him.  They were able to get him to eat for dinner.  They used a wheelchair to transfer him to the bed and patient was able to help with the transfer.  They noted that he had taken his shorts off and he was covered in vomit.  This was very unusual for him.  They also reported that the night prior to admission, he was screaming loudly and his right arm looked like it was having some twitches.  He was using his left hand to pick up his right arm.   Family called EMS and he was brought to the ED.  He was noted to have intermittent right upper extremity twitching and neurology was consulted for further evaluation and workup. He has not missed any doses of Keppra. No recent sickness. Per daughter, there have not been any missed doses of Keppra.   Was last admitted 05/23/2023-05/30/2023 for suspicion of seizure activity and agitation. EEG during that hospitalization showed epileptogenicity in the left posterior quadrant but no seizures. He was discharged on Keppra 1000 mg BID. During that hospitalization he did have intermittent confusion but was able to speak in full sentences.   Subjective: Interval History:  No family at  bedside.  Opens eyes to voice, states "yes?" When calling his name. Raises left arm to command but does not hold up two fingers or thumb when asked. No movement in right arm. Left leg with more movement than right.   Objective: Vital signs in last 24 hours: Temp:  [97.9 F (36.6 C)-99.5 F (37.5 C)] 99.5 F (37.5 C) (09/30 0721) Pulse Rate:  [96-112] 109 (09/30 0721) Resp:  [16-20] 18 (09/30 0721) BP: (102-126)/(60-76) 124/76 (09/30 0721) SpO2:  [95 %-100 %] 97 % (09/30 0721)   Gen: NAD. Sitting in bed, appears comfortable. Non verbal. HEETN: [place]/AT. Resp: Non-labored.   Neuro: Alert, awake. Looking around, tracks examiner. Will state "yes" when his name is called out.  He will not tell me his name or answer orientation questions. Will intermittently follow simple commands but only with repetition. CN: PERRL, no forced gaze. Blinks to threat b/l.  Tongue midline.  Motor: More spontaneous movement in left upper and lower extremity Will react to painful stimuli  in all 4 ext.      Lab Results: Recent Labs    07/08/23 1245 07/09/23 1945  WBC 10.3 10.4  HGB 14.1 12.3*  HCT 42.2 38.4*  PLT 292 299  NA 140 140  K 3.8 3.6  CL 100 103  CO2 23 25  GLUCOSE 117* 127*  BUN 5* 5*  CREATININE 0.56* 0.69  CALCIUM  9.7 9.2   Lipid Panel No results for input(s): "CHOL", "TRIG", "HDL", "CHOLHDL", "VLDL", "LDLCALC" in the last 72 hours.  Studies/Results: Overnight EEG with video  Result Date: 07/09/2023 Charlsie Quest, MD     07/09/2023  8:24 AM Patient Name: Brian Dudley MRN: [id number] Epilepsy Attending: Charlsie Quest Referring Physician/Provider: Erick Blinks, MD Duration: 07/08/2023 2209 to 07/09/2023 0815 Patient history: 62 y.o. male with hx of prior L MCA stroke c/b seizures on Keppra 1000mg  BID with recent EEG showing L posterior quadrant spikes, who presents with aphasia out of proportion to confusion and intermittent RUE twitching. EEG to evaluate for seizure  Level of alertness: Awake, asleep AEDs during EEG study: LEV, VPA, Ativan Technical aspects: This EEG study was done with scalp electrodes positioned according to the 10-20 International system of electrode placement. Electrical activity was reviewed with band pass filter of 1-70Hz , sensitivity of 7 uV/mm, display speed of 108mm/sec with a 60Hz  notched filter applied as appropriate. EEG data were recorded continuously and digitally stored.  Video monitoring was available and reviewed as appropriate. Description: The posterior dominant rhythm consists of 8 Hz activity of moderate voltage (25-35 uV) seen predominantly in posterior head regions, asymmetric ( left<right) and reactive to eye opening and eye closing. Sleep was characterized by sleep spindles (12 to 15 Hz), maximal frontocentral region. EEG showed continuous 3 to 5 Hz theta-delta slowing in left hemisphere as well as intermittent generalized 3-6hz  theta-delta slowing. Lateralized periodic discharges with overriding fast activity were noted in left hemisphere, maximal left posterior quadrant at 1Hz . Clinically, patient had right upper extremity jerking per Dr Derry Lory ( difficult to see on camera). 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 Patient was loaded with IV Valproic acid on 07/08/2023 at 2315. Subsequently clinical jerking resolved. EEG continued to show lateralized periodic discharges with overriding fast activity were noted in left hemisphere, maximal left posterior quadrant at 1Hz .  ABNORMALITY - Focal convulsive status epilepticus, left posterior quadrant. - Lateralized periodic discharges with overriding fast activity, left hemisphere, maximal left posterior quadrant ( LPD+F) - Continuous slow, left hemisphere - Intermittent slow, generalized - Background asymmetry, left<right  IMPRESSION: At the beginning of the study, patient reportedly had right upper extremity jerking.  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  Patient was loaded with IV Valproic acid on 07/08/2023 at 2315. Subsequently clinical jerking resolved. EEG continued to show evidence of epileptogenicity arising from left hemisphere, maximal left posterior quadrant with increased risk of seizure recurrence. Additionally there was cortical dysfunction arising from left hemisphere likely secondary to underlying stroke. Lastly there was moderate diffuse encephalopathy.  Charlsie Quest   CT Head Wo Contrast  Result Date: 07/08/2023 CLINICAL DATA:  Mental status changes. EXAM: CT HEAD WITHOUT CONTRAST TECHNIQUE: Contiguous axial images were obtained from the base of the skull through the vertex without intravenous contrast. RADIATION DOSE REDUCTION: This exam was performed according to the departmental dose-optimization program which includes automated exposure control, adjustment of the mA and/or kV according to patient size and/or use of iterative reconstruction technique. COMPARISON:  MRI brain 07/05/2023 FINDINGS: Brain: There is no evidence for acute hemorrhage, hydrocephalus, mass lesion, or abnormal extra-axial fluid collection. No definite CT evidence for acute infarction. Patchy low attenuation in the deep hemispheric and periventricular white matter is nonspecific, but likely reflects chronic microvascular ischemic demyelination. Old left MCA territory  infarct again noted. Vascular: No hyperdense vessel or unexpected calcification. Skull: No evidence for fracture. No worrisome lytic or sclerotic lesion. Sinuses/Orbits: The visualized paranasal sinuses and mastoid air cells are clear. Visualized portions of the globes and intraorbital fat are unremarkable. Other: None. IMPRESSION: 1. No acute intracranial abnormality. 2. Old left MCA territory infarct. 3. Chronic small vessel white matter ischemic  disease. Electronically Signed   By: Kennith Center M.D.   On: 07/08/2023 16:31   DG Chest Portable 1 View  Result Date: 07/08/2023 CLINICAL DATA:  Altered mental status. EXAM: PORTABLE CHEST 1 VIEW COMPARISON:  Chest radiograph dated 05/24/2023. FINDINGS: The heart size and mediastinal contours are within normal limits. Mild left basilar atelectasis/airspace disease. No significant pleural effusion or pneumothorax. Degenerative changes are seen in the spine. IMPRESSION: Mild left basilar atelectasis/airspace disease. Electronically Signed   By: Romona Curls M.D.   On: 07/08/2023 14:49    Medications: Scheduled:  clopidogrel  75 mg Oral Daily   enoxaparin (LOVENOX) injection  40 mg Subcutaneous Q24H   fluticasone furoate-vilanterol  1 puff Inhalation Daily   And   umeclidinium bromide  1 puff Inhalation Daily   folic acid  1 mg Oral Daily   levETIRAcetam  1,500 mg Oral BID   metoprolol tartrate  50 mg Oral BID   rosuvastatin  20 mg Oral QHS   thiamine (VITAMIN B1) injection  100 mg Intravenous Daily   Continuous:  levETIRAcetam 1,500 mg (07/09/23 2114)   valproate sodium 250 mg (07/10/23 0401)    LTM EEG 07/08/2023 2209 to 07/09/2023 2209  ABNORMALITY - Focal convulsive status epilepticus, left posterior quadrant. - Lateralized periodic discharges with overriding fast activity, left hemisphere, maximal left posterior quadrant ( LPD+F) - Continuous slow, left hemisphere - Intermittent slow, generalized - Background asymmetry, left<right  IMPRESSION: At the beginning of the study, patient reportedly had right upper extremity jerking. 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   Patient was loaded with IV Valproic acid on 07/08/2023 at 2315. Subsequently clinical jerking resolved. EEG continued to show evidence of epileptogenicity arising from left hemisphere, maximal left  posterior quadrant with increased risk of seizure recurrence. Additionally there was cortical dysfunction arising from left hemisphere likely secondary to underlying stroke. Lastly there was moderate diffuse encephalopathy.  Assessment: 62 year old male with a history of left MCA stroke and seizures, on Keppra 1000 mg twice daily at baseline, history of hypertension and prostate cancer.  Admitted for AMS and right upper extremity twitching concerning for focal seizure.  Loaded with fosphenytoin but clinical and electrographic seizure finally broke with Depakote load.  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 - Chronic right sided weakness secondary to prior left MCA stroke   Recommendations: - Continue Depakote 250 mg IV q6h - Increasing Keppra to 2000 mg BID - Another day of LTM to assess for stability due to increased risk for seizure recurrence . - Neurology will continue to follow.   LOS: 2 days   Patient seen and examined by NP/APP with MD.  Elmer Picker, DNP, FNP-BC [facility] Pager: [phone]  Electronically signed: Dr. Caryl Pina

## 2023-07-10 NOTE — Progress Notes (Signed)
PT pulling at wires as per [facility], fp1/p7 lose. Fixing wires when pt calms down

## 2023-07-10 NOTE — Progress Notes (Addendum)
HD#2 SUBJECTIVE:  Patient Summary: Brian Dudley is a 62 y.o. male with a pertinent PMH of recent left MCA CVA c/b seizures, Stage IV prostate cancer, history of stage III rectosigmoid cancer s/p resection, HTN, and BPH who presented with worsening aphasia, weakness, and right arm twitching and is admitted for focal status epilepticus.   Overnight Events: None  Interim History: Patient was evaluated at bedside. He is alert and able to follow commands. No change in aphasia. ROM and strength unchanged.   OBJECTIVE:  Vital Signs: Vitals:   07/09/23 2100 07/09/23 2307 07/10/23 0338 07/10/23 0721  BP: 116/66 116/61 107/65 124/76  Pulse: (!) 107 (!) 112 (!) 109 (!) 109  Resp: 20 18 16 18   Temp: 98.9 F (37.2 C) 99.3 F (37.4 C) 99.3 F (37.4 C) 99.5 F (37.5 C)  TempSrc: Axillary Axillary Axillary Axillary  SpO2: 95% 100% 100% 97%  Weight:      Height:       Supplemental O2: Room Air SpO2: 97 %  Filed Weights   07/08/23 1230  Weight: 69.4 kg    Intake/Output Summary (Last 24 hours) at 07/10/2023 1103 Last data filed at 07/10/2023 0900 Gross per 24 hour  Intake 756.3 ml  Output 1225 ml  Net -468.7 ml   Net IO Since Admission: -468.7 mL [07/10/23 1103]  Physical Exam: General: laying in bed, in NAD Cardiac: RRR, no m/r/g, no edema Pulmonary: CTA bilaterally, normal effort; loud upper airway sounds Abdomen: soft, non-tender, non-distended MSK: normal bulk/tone, decreased ROM and strength on the right, unchanged from yesterday.  Neuro: alert, aphasic; responds appropriately to commands with motor response but verbal response inappropriate. Weakness unchanged. No new focal deficits noted.   Patient Lines/Drains/Airways Status     Active Line/Drains/Airways     Name Placement date Placement time Site Days   Peripheral IV 07/08/23 22 G Antecubital 07/08/23  1514  Antecubital  1            Pertinent Labs:    Latest Ref Rng & Units 07/10/2023    7:34 AM  07/09/2023    7:45 PM 07/08/2023   12:45 PM  CBC  WBC 4.0 - 10.5 K/uL 9.4  10.4  10.3   Hemoglobin 13.0 - 17.0 g/dL 16.1  09.6  04.5   Hematocrit 39.0 - 52.0 % 41.0  38.4  42.2   Platelets 150 - 400 K/uL 310  299  292        Latest Ref Rng & Units 07/10/2023    7:34 AM 07/09/2023    7:45 PM 07/08/2023   12:45 PM  CMP  Glucose 70 - 99 mg/dL 78  409  811   BUN 8 - 23 mg/dL 5  5  5    Creatinine 0.61 - 1.24 mg/dL 9.14  7.82  9.56   Sodium 135 - 145 mmol/L 136  140  140   Potassium 3.5 - 5.1 mmol/L 3.4  3.6  3.8   Chloride 98 - 111 mmol/L 104  103  100   CO2 22 - 32 mmol/L 23  25  23    Calcium 8.9 - 10.3 mg/dL 8.9  9.2  9.7   Total Protein 6.5 - 8.1 g/dL 7.2   8.7   Total Bilirubin 0.3 - 1.2 mg/dL 0.7   0.3   Alkaline Phos 38 - 126 U/L 214   241   AST 15 - 41 U/L 16   21   ALT 0 - 44 U/L 10  12     Recent Labs    07/08/23 1347  GLUCAP 107*    ASSESSMENT/PLAN:  Assessment: Principal Problem:   Status epilepticus (HCC) Active Problems:   COPD (chronic obstructive pulmonary disease) (HCC)   HLD (hyperlipidemia)   BPH (benign prostatic hyperplasia)   Prostate cancer (HCC)   Hypertension   Rectal cancer (HCC)  Plan: Focal Convulsive Status Epilepticus  History of L MCA CVA Neurology following. Still on LTM EEG. Takes Keppra 1000 mg BID at home. Currently on depakote 250 mg IV q6h, Keppra 1500 mg BID per neurology. Spoke with his daughter Brian Dudley, who was bedside this afternoon, and gave her an update.  -Neurology following, appreciate recs -Passed swallow study, restarted home metoprolol, plavix, ASA, rosuvastatin -F/u neuro recs for any adjustments to antiseizure meds -Continue thiamine, folate -Seizure precautions, neurochecks q2h  Chronic conditions: PO home meds restarted after passing swallow study Metastatic Prostate Cancer- follows outpatient with Dr. Cherly Hensen. Previous history of radiation and ADT/ARPI, known metastasis to lymph nodes/bones. Has follow up on 10/3  with repeat pet scan.  HTN- held amlodopine 5 mg since BP wnl; restarted metoprolol 50 mg BID Hx CVA, HLD- restarted plavix 75 mg, ASA 81 mg daily, rosuvastatin 20 mg daily BPH- holding tamsulosin 0.4 mg daily COPD- restarted montelukast 10 mg nightly, trelegy ellipta  Best Practice: Diet: Dys 2 IVF: Fluids: None VTE: enoxaparin (LOVENOX) injection 40 mg Start: 07/08/23 2100 Code: Full AB: None DISPO: Anticipated discharge in 3-5 days pending medical workup.  Signature: Annett Fabian, MD  Internal Medicine Resident, PGY-1 [facility]  Pager: [phone] 11:03 AM, 07/10/2023   Please contact the on call pager after 5 pm and on weekends at [phone].

## 2023-07-10 NOTE — Procedures (Addendum)
Patient Name: Brian Dudley  MRN: [id number]  Epilepsy Attending: Charlsie Quest  Referring Physician/Provider: Erick Blinks, MD  Duration: 07/09/2023 2209 to 07/10/2023 2209   Patient history: 62 y.o. male with hx of prior L MCA stroke c/b seizures on Keppra 1000mg  BID with recent EEG showing L posterior quadrant spikes, who presents with aphasia out of proportion to confusion and intermittent RUE twitching. EEG to evaluate for seizure   Level of alertness: Awake, asleep   AEDs during EEG study: LEV, VPA   Technical aspects: This EEG study was done with scalp electrodes positioned according to the 10-20 International system of electrode placement. Electrical activity was reviewed with band pass filter of 1-70Hz , sensitivity of 7 uV/mm, display speed of 29mm/sec with a 60Hz  notched filter applied as appropriate. EEG data were recorded continuously and digitally stored.  Video monitoring was available and reviewed as appropriate.   Description: The posterior dominant rhythm consists of 8 Hz activity of moderate voltage (25-35 uV) seen predominantly in posterior head regions, asymmetric ( left<right) and reactive to eye opening and eye closing. Sleep was characterized by sleep spindles (12 to 15 Hz), maximal frontocentral region. EEG showed continuous 3 to 5 Hz theta-delta slowing in left hemisphere as well as intermittent generalized 3-6hz  theta-delta slowing. Lateralized periodic discharges with overriding fast activity were noted in left hemisphere, maximal left posterior quadrant at 1-1.5Hz .   ABNORMALITY - Lateralized periodic discharges with overriding fast activity, left hemisphere, maximal left posterior quadrant ( LPD+F) - Continuous slow, left hemisphere - Intermittent slow, generalized - Background asymmetry, left<right   IMPRESSION: This study showed evidence of epileptogenicity arising from left hemisphere, maximal left posterior quadrant with increased risk of seizure  recurrence. Additionally there was cortical dysfunction arising from left hemisphere likely secondary to underlying stroke. Lastly there was moderate diffuse encephalopathy. No definite seizures were noted.  Of note, focal motor seizures may not be seen on scalp EEG.  Clinical correlation is recommended.   [name] [name]

## 2023-07-11 ENCOUNTER — Inpatient Hospital Stay (HOSPITAL_COMMUNITY): Payer: 59

## 2023-07-11 ENCOUNTER — Other Ambulatory Visit (HOSPITAL_COMMUNITY): Payer: Self-pay

## 2023-07-11 ENCOUNTER — Telehealth (HOSPITAL_COMMUNITY): Payer: Self-pay | Admitting: Pharmacy Technician

## 2023-07-11 ENCOUNTER — Ambulatory Visit: Payer: 59 | Admitting: Neurology

## 2023-07-11 DIAGNOSIS — G40901 Epilepsy, unspecified, not intractable, with status epilepticus: Secondary | ICD-10-CM | POA: Diagnosis not present

## 2023-07-11 LAB — BASIC METABOLIC PANEL
Anion gap: 11 (ref 5–15)
BUN: 5 mg/dL — ABNORMAL LOW (ref 8–23)
CO2: 21 mmol/L — ABNORMAL LOW (ref 22–32)
Calcium: 8.8 mg/dL — ABNORMAL LOW (ref 8.9–10.3)
Chloride: 105 mmol/L (ref 98–111)
Creatinine, Ser: 0.51 mg/dL — ABNORMAL LOW (ref 0.61–1.24)
GFR, Estimated: >60 mL/min (ref >60)
Glucose, Bld: 82 mg/dL (ref 70–99)
Potassium: 4.3 mmol/L (ref 3.5–5.1)
Sodium: 137 mmol/L (ref 135–145)

## 2023-07-11 MED ORDER — DIVALPROEX SODIUM 250 MG PO DR TAB
500.0000 mg | DELAYED_RELEASE_TABLET | Freq: Two times a day (BID) | ORAL | Status: DC
  Administered 2023-07-11 – 2023-07-12 (×3): 500 mg via ORAL
  Filled 2023-07-11 (×4): qty 2

## 2023-07-11 MED ORDER — TAMSULOSIN HCL 0.4 MG PO CAPS
0.4000 mg | ORAL_CAPSULE | Freq: Every day | ORAL | Status: DC
  Administered 2023-07-11 – 2023-07-13 (×3): 0.4 mg via ORAL
  Filled 2023-07-11 (×3): qty 1

## 2023-07-11 MED ORDER — LEVETIRACETAM 750 MG PO TABS
2000.0000 mg | ORAL_TABLET | Freq: Two times a day (BID) | ORAL | Status: DC
  Administered 2023-07-11 – 2023-07-13 (×4): 2000 mg via ORAL
  Filled 2023-07-11: qty 2
  Filled 2023-07-11 (×3): qty 1

## 2023-07-11 NOTE — Care Management Important Message (Signed)
Important Message  Patient Details  Name: Brian Dudley MRN: [id number] Date of Birth: [birth year]   Important Message Given:  Yes - Medicare IM     [name] 07/11/2023, 3:42 PM

## 2023-07-11 NOTE — Procedures (Addendum)
Patient Name: RITA PROM  MRN: [id number]  Epilepsy Attending: Charlsie Quest  Referring Physician/Provider: Erick Blinks, MD  Duration: 07/10/2023 2209 to 07/11/2023 1052   Patient history: 62 y.o. male with hx of prior L MCA stroke c/b seizures on Keppra 1000mg  BID with recent EEG showing L posterior quadrant spikes, who presents with aphasia out of proportion to confusion and intermittent RUE twitching. EEG to evaluate for seizure   Level of alertness: Awake, asleep   AEDs during EEG study: LEV, VPA   Technical aspects: This EEG study was done with scalp electrodes positioned according to the 10-20 International system of electrode placement. Electrical activity was reviewed with band pass filter of 1-70Hz , sensitivity of 7 uV/mm, display speed of 23mm/sec with a 60Hz  notched filter applied as appropriate. EEG data were recorded continuously and digitally stored.  Video monitoring was available and reviewed as appropriate.   Description: The posterior dominant rhythm consists of 8 Hz activity of moderate voltage (25-35 uV) seen predominantly in posterior head regions, asymmetric ( left<right) and reactive to eye opening and eye closing. Sleep was characterized by sleep spindles (12 to 15 Hz), maximal frontocentral region. EEG showed continuous 3 to 5 Hz theta-delta slowing in left hemisphere as well as intermittent generalized 3-6hz  theta-delta slowing. Lateralized periodic discharges were noted in left hemisphere, maximal left posterior quadrant at 0.75-1Hz .   ABNORMALITY - Lateralized periodic discharges, left hemisphere, maximal left posterior quadrant ( LPD) - Continuous slow, left hemisphere - Intermittent slow, generalized - Background asymmetry, left<right   IMPRESSION: This study showed evidence of epileptogenicity and cortical dysfunction arising from left hemisphere, maximal left posterior quadrant likely secondary to underlying stroke. Additionally there was moderate  diffuse encephalopathy. No definite seizures were noted.    EEG appears to be improving compared to previous day   Ilir Mahrt [name]

## 2023-07-11 NOTE — Progress Notes (Signed)
HD#3 SUBJECTIVE:  Patient Summary: Brian Dudley is a 62 y.o. male with a pertinent PMH of recent left MCA CVA c/b seizures, Stage IV prostate cancer, history of stage III rectosigmoid cancer s/p resection, HTN, and BPH who presented with worsening aphasia, weakness, and right arm twitching and is admitted for focal status epilepticus.   Overnight Events: None  Interim History: Patient was evaluated at bedside. Alert and engaged. More verbally responsive today, but still pretty aphasic and speech inappropriate. Patient has no new concerns at this time.   OBJECTIVE:  Vital Signs: Vitals:   07/10/23 2320 07/11/23 0324 07/11/23 0816 07/11/23 1123  BP: (!) 117/92 124/76 (!) 144/79 118/80  Pulse: 91 90 94 88  Resp: 18 19 18 16   Temp: 98.8 F (37.1 C) 98.1 F (36.7 C) 98.8 F (37.1 C) 97.6 F (36.4 C)  TempSrc: Oral Oral Oral Axillary  SpO2: 100% 99% 99% 100%  Weight:      Height:       Supplemental O2: Room Air SpO2: 100 %  Filed Weights   07/08/23 1230  Weight: 69.4 kg    Intake/Output Summary (Last 24 hours) at 07/11/2023 1337 Last data filed at 07/11/2023 0817 Gross per 24 hour  Intake 218 ml  Output 1100 ml  Net -882 ml   Net IO Since Admission: -992.7 mL [07/11/23 1337]  Physical Exam: General: laying in bed, in NAD Cardiac: RRR, no m/r/g, no edema Pulmonary: CTA bilaterally, normal effort Abdomen: soft, non-tender, non-distended MSK: normal bulk/tone, ROM still significantly decreased on right, in tact on left.  Neuro: alert, tracks [place] with his eyes, responds verbally but mostly inappropriate. Responded to commands appropriately. No change in focal deficits from previous exam.   Patient Lines/Drains/Airways Status     Active Line/Drains/Airways     Name Placement date Placement time Site Days   Peripheral IV 07/08/23 22 G Antecubital 07/08/23  1514  Antecubital  1            Pertinent Labs:    Latest Ref Rng & Units 07/10/2023    7:34 AM  07/09/2023    7:45 PM 07/08/2023   12:45 PM  CBC  WBC 4.0 - 10.5 K/uL 9.4  10.4  10.3   Hemoglobin 13.0 - 17.0 g/dL 40.9  81.1  91.4   Hematocrit 39.0 - 52.0 % 41.0  38.4  42.2   Platelets 150 - 400 K/uL 310  299  292        Latest Ref Rng & Units 07/11/2023    6:16 AM 07/10/2023    7:34 AM 07/09/2023    7:45 PM  CMP  Glucose 70 - 99 mg/dL 82  78  782   BUN 8 - 23 mg/dL 5  5  5    Creatinine 0.61 - 1.24 mg/dL 9.56  2.13  0.86   Sodium 135 - 145 mmol/L 137  136  140   Potassium 3.5 - 5.1 mmol/L 4.3  3.4  3.6   Chloride 98 - 111 mmol/L 105  104  103   CO2 22 - 32 mmol/L 21  23  25    Calcium 8.9 - 10.3 mg/dL 8.8  8.9  9.2   Total Protein 6.5 - 8.1 g/dL  7.2    Total Bilirubin 0.3 - 1.2 mg/dL  0.7    Alkaline Phos 38 - 126 U/L  214    AST 15 - 41 U/L  16    ALT 0 - 44 U/L  10  Recent Labs    07/08/23 1347  GLUCAP 107*    ASSESSMENT/PLAN:  Assessment: Principal Problem:   Status epilepticus (HCC) Active Problems:   COPD (chronic obstructive pulmonary disease) (HCC)   HLD (hyperlipidemia)   BPH (benign prostatic hyperplasia)   Prostate cancer (HCC)   Hypertension   Rectal cancer (HCC)  Plan: Focal Convulsive Status Epilepticus  History of L MCA CVA Neurology following. On keppra 2000 mg BID, depakote transitioned to oral, 500 mg BID. LTM EEG discontinued given no continued seizure activity. Has IV versed for breakthrough seizures while admitted, will need to be discharged with intranasal valtoco 15mg  for seizures lasting over 2 minutes. His aphasia and weakness are slightly improved this morning, may take several days to see more improvement.  -Neurology following, appreciate recs -Continue keppra 200 mg BID, depakote 500 mg BID -Continue thiamine, folate -Seizure precautions, neurochecks q2h -Continue PT/OT -SLP speech/language eval  Chronic conditions: PO home meds restarted after passing swallow study Metastatic Prostate Cancer- follows outpatient with Dr.  Cherly Hensen. Previous history of radiation and ADT/ARPI, known metastasis to lymph nodes/bones. Has follow up on 10/3 with repeat pet scan. Dr. Cherly Hensen reached out to medicine team, may need to reschedule follow up appointment on 10/3 given patient's hospital course.  HTN- held amlodopine 5 mg since BP wnl; restarted metoprolol 50 mg BID Hx CVA, HLD- restarted plavix 75 mg, ASA 81 mg daily, rosuvastatin 20 mg daily BPH- restarted tamsulosin 0.4 mg daily COPD- restarted montelukast 10 mg nightly, trelegy ellipta  Best Practice: Diet: Dys 2 IVF: Fluids: None VTE: enoxaparin (LOVENOX) injection 40 mg Start: 07/08/23 2100 Code: Full AB: None DISPO: Anticipated discharge in 2-4 days pending medical workup.  Signature: Annett Fabian, MD  Internal Medicine Resident, PGY-1 [facility]  Pager: [phone] 1:37 PM, 07/11/2023   Please contact the on call pager after 5 pm and on weekends at [phone].

## 2023-07-11 NOTE — Progress Notes (Signed)
Pt rehooked, no skin breakdown

## 2023-07-11 NOTE — Telephone Encounter (Signed)
Pharmacy Patient Advocate Encounter  Received notification from [facility] that Prior Authorization for Valtoco 15 MG Dose 7.5MG /0.1ML liquid has been APPROVED from 07/11/2023 to 10/09/2024. Ran test claim, Copay is $0.00. This test claim was processed through [facility]- copay amounts may vary at other pharmacies due to pharmacy/plan contracts, or as the patient moves through the different stages of their insurance plan.   PA #/Case ID/Reference #: [id number]

## 2023-07-11 NOTE — TOC Benefit Eligibility Note (Signed)
Patient Product/process development scientist completed.    The patient is insured through [facility]. Patient has Medicare and is not eligible for a copay card, but may be able to apply for patient assistance, if available.    Ran test claim for Valtoco 15mg  - Requires prior authorization  This test claim was processed through [employer]- copay amounts may vary at other pharmacies due to [employer], or as the patient moves through the different stages of their insurance plan.     Roland Earl, CPHT Pharmacy Technician III Certified Patient Advocate [facility] Pharmacy Patient Advocate Team Direct Number: [phone]  Fax: [phone]

## 2023-07-11 NOTE — Progress Notes (Addendum)
Subjective: No acute events overnight.  ROS: Unable to obtain due to aphasia  Examination  Vital signs in last 24 hours: Temp:  [97.8 F (36.6 C)-99.3 F (37.4 C)] 98.8 F (37.1 C) (10/01 0816) Pulse Rate:  [90-113] 94 (10/01 0816) Resp:  [16-19] 18 (10/01 0816) BP: (110-167)/(65-92) 144/79 (10/01 0816) SpO2:  [97 %-100 %] 99 % (10/01 0816)  General: lying in bed, NAD Neuro: Awake, alert, looks at examiner and tracks examiner in room, mumbling some words but difficult to understand, did not follow any command, pupils equally round and reactive, no apparent facial asymmetry, spontaneously moving left upper and left lower extremity, did move right upper extremity with antigravity strength, did move right lower extremity against gravity  Basic Metabolic Panel: Recent Labs  Lab 07/08/23 1245 07/08/23 1412 07/09/23 1945 07/10/23 0734 07/11/23 0616  NA 140  --  140 136 137  K 3.8  --  3.6 3.4* 4.3  CL 100  --  103 104 105  CO2 23  --  25 23 21*  GLUCOSE 117*  --  127* 78 82  BUN 5*  --  5* 5* 5*  CREATININE 0.56*  --  0.69 0.78 0.51*  CALCIUM 9.7  --  9.2 8.9 8.8*  MG  --  2.1  --   --   --     CBC: Recent Labs  Lab 07/08/23 1245 07/09/23 1945 07/10/23 0734  WBC 10.3 10.4 9.4  HGB 14.1 12.3* 13.2  HCT 42.2 38.4* 41.0  MCV 84.2 83.7 84.7  PLT 292 299 310     Coagulation Studies: No results for input(s): "LABPROT", "INR" in the last 72 hours.  Imaging CT head without contrast 07/08/2023: No acute intracranial abnormality. Old left MCA territory infarct. Chronic small vessel white matter ischemic disease.  ASSESSMENT AND PLAN: 62 year old male with a history of left MCA stroke and seizures, on Keppra 1000 mg twice daily at baseline, history of hypertension and prostate cancer.  Admitted for AMS and right upper extremity twitching concerning for focal seizure.  Loaded with fosphenytoin but clinical and electrographic seizure finally broke with Depakote load.   Focal  convulsive status epilepticus, resolved -Etiology of status epilepticus: Unclear  Recommendations -No further seizures therefore we will DC LTM EEG -Continue Keppra 2000 mg twice daily and Depakote 250 mg every 6 hours. -Will check with RN and if patient is consistently taking p.o., can switch to PO Keppra 2000 mg twice daily and Depakote DR 500 mg twice daily -Patient is still aphasic which might take a few days to improve -Continue seizure precautions -As needed IV versed for breakthrough seizure while in the hospital -Rescue medication at the time of discharge: intranasal valtoco 15mg  for seizure lasting over 2 minutes -Discussed plan with medicine team by secure chat  I have spent a total of  36  minutes with the patient reviewing hospital notes,  test results, labs and examining the patient as well as establishing an assessment and plan .  > 50% of time was spent in direct patient care.     Lindie Spruce Epilepsy [facility] For questions after 5pm please refer to AMION to reach the Neurologist on call

## 2023-07-11 NOTE — Telephone Encounter (Signed)
Pharmacy Patient Advocate Encounter   Received notification that prior authorization for Valtoco 15 MG Dose 7.5MG /0.1ML liquid is required/requested.   Insurance verification completed.   The patient is insured through [facility] .   Per test claim: PA required; PA submitted to [facility] via CoverMyMeds Key/confirmation #/EOC [id number] Status is pending

## 2023-07-11 NOTE — Progress Notes (Signed)
LTM EEG discontinued - no skin breakdown at unhook.   

## 2023-07-11 NOTE — TOC Initial Note (Addendum)
Transition of Care [facility]) - Initial/Assessment Note    Patient Details  Name: Brian Dudley MRN: [id number] Date of Birth: [birth year]  [facility] CM/SW Contact:    [name], LCSW Phone Number: 07/11/2023, 3:40 PM  Clinical Narrative:        CSW spoke with daughter, [name], to discuss recommendation for SNF. [name] in agreement, frustrated that the patient has had another setback when he did so well in rehab and has been doing well at home. CSW discussed CMS Choice with [name], she would like patient to return to [facility] as he was recently there and they enjoyed the experience. CSW completed referral and sent to [facility], they are reviewing. CSW to follow.           Expected Discharge Plan: Skilled Nursing Facility Barriers to Discharge: Continued Medical Work up, English as a second language teacher   Patient Goals and CMS Choice Patient states their goals for this hospitalization and ongoing recovery are:: patient unable to participate in goal setting, not oriented CMS Medicare.gov Compare Post Acute Care list provided to:: Patient Represenative (must comment) Choice offered to / list presented to : Adult Children [place] ownership interest in [facility].provided to:: Adult Children    Expected Discharge Plan and Services     Post Acute Care Choice: Skilled Nursing Facility Living arrangements for the past 2 months: Single Family Home                                      Prior Living Arrangements/Services Living arrangements for the past 2 months: Single Family Home Lives with:: Adult Children Patient language and need for interpreter reviewed:: No Do you feel safe going back to the place where you live?: Yes      Need for Family Participation in Patient Care: Yes (Comment) Care giver support system in place?: No (comment)   Criminal Activity/Legal Involvement Pertinent to Current Situation/Hospitalization: No - Comment as  needed  Activities of Daily Living      Permission Sought/Granted Permission sought to share information with : Facility Medical sales representative, Family Supports Permission granted to share information with : Yes, Verbal Permission Granted  Share Information with NAME: [name]  Permission granted to share info w AGENCY: SNF  Permission granted to share info w Relationship: Daughter     Emotional Assessment   Attitude/Demeanor/Rapport: Unable to Assess Affect (typically observed): Unable to Assess Orientation: : Oriented to Self Alcohol / Substance Use: Not Applicable Psych Involvement: No (comment)  Admission diagnosis:  Status epilepticus (HCC) [G40.901] Aphagia [R13.0] Right sided weakness [R53.1] Altered mental status, unspecified altered mental status type [R41.82] Patient Active Problem List   Diagnosis Date Noted   Rectal cancer (HCC) 07/09/2023   Status epilepticus (HCC) 07/08/2023   Hypertension    Family history of pancreatic cancer    Abnormal brain MRI 06/22/2023   At risk for side effect of medication 06/22/2023   Normocytic anemia 06/21/2023   COPD (chronic obstructive pulmonary disease) (HCC) 05/24/2023   Tobacco abuse 05/24/2023   HLD (hyperlipidemia) 05/24/2023   BPH (benign prostatic hyperplasia) 05/24/2023   Prostate cancer (HCC) 05/24/2023   History of malignant neoplasm of rectosigmoid junction 05/24/2023   Altered mental status 05/24/2023   Colonic diverticular abscess    Perforation of sigmoid colon due to diverticulitis 03/24/2018   PCP:  Jerrell Belfast, MD Pharmacy:   [facility] [phone] -  [place], [place] - [address] AT [facility] & [place] [address] [facility] [place] [address] Phone: [phone] Fax: [phone]  [id number] - [address] ([place]), [place] - [address] [place] [address] Phone: [phone] Fax: [phone]     Social Determinants  of Health (SDOH) Social History: SDOH Screenings   Food Insecurity: Patient Unable To Answer (05/24/2023)  Housing: Patient Unable To Answer (05/24/2023)  Transportation Needs: No Transportation Needs (09/09/2019)   Received from [facility]  Utilities: Patient Unable To Answer (05/24/2023)  Financial Resource Strain: Low Risk  (09/09/2019)   Received from [facility] [place]  Physical Activity: Inactive (11/07/2018)   Received from [facility]  Social Connections: Unknown (12/06/2022)   Received from [facility]  Stress: Stress Concern Present (11/07/2018)   Received from [facility]  Tobacco Use: High Risk (07/08/2023)   SDOH Interventions:     Readmission Risk Interventions     No data to display

## 2023-07-11 NOTE — Evaluation (Signed)
Occupational Therapy Evaluation Patient Details Name: Brian Dudley MRN: [id number] DOB: October 26, 1960 Today's Date: 07/11/2023   History of Present Illness Pt is a 62 y/o M admitted on 07/08/23 after presenting with c/o worsening aphasia, weakness, & RUE twitching. Pt is admitted for focal status epilepticus. 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 Pt with expressive difficulties, needs mod-total A for ADLs, max A for bed mobility to sit EOB. No active movement noted in RUE or RLE during session. Pt presenting with impairments listed below, will follow acutely. Patient will benefit from continued inpatient follow up therapy, <3 hours/day to maximize safety/ind with ADLs/functional mobility.        If plan is discharge home, recommend the following: A lot of help with walking and/or transfers;Two people to help with walking and/or transfers;A lot of help with bathing/dressing/bathroom;Two people to help with bathing/dressing/bathroom    Functional Status Assessment  Patient has had a recent decline in their functional status and demonstrates the ability to make significant improvements in function in a reasonable and predictable amount of time.  Equipment Recommendations  Other (comment) (defer)    Recommendations for Other Services PT consult     Precautions / Restrictions Precautions Precautions: Fall Restrictions Weight Bearing Restrictions: No      Mobility Bed Mobility Overal bed mobility: Needs Assistance Bed Mobility: Rolling     Supine to sit: Max assist Sit to supine: Max assist   General bed mobility comments: pt hooking LUE under therapist's arm to pull self to EOB    Transfers                   General transfer comment: deferred      Balance Overall  balance assessment: Needs assistance Sitting-balance support: Single extremity supported Sitting balance-Leahy Scale: Zero Sitting balance - Comments: min-mod A posteriorly to remain upright at EOB                                   ADL either performed or assessed with clinical judgement   ADL Overall ADL's : Needs assistance/impaired Eating/Feeding: Moderate assistance   Grooming: Minimal assistance Grooming Details (indicate cue type and reason): cues to perform Upper Body Bathing: Maximal assistance;Moderate assistance   Lower Body Bathing: Maximal assistance   Upper Body Dressing : Moderate assistance   Lower Body Dressing: Maximal assistance   Toilet Transfer: Moderate assistance;Maximal assistance   Toileting- Clothing Manipulation and Hygiene: Total assistance       Functional mobility during ADLs: Moderate assistance;Maximal assistance       Vision   Vision Assessment?: Vision impaired- to be further tested in functional context Additional Comments: tend to have gaze to L, can look to R with max cues     Perception Perception: Not tested       Praxis Praxis: Not tested       Pertinent Vitals/Pain Pain Assessment Pain Assessment: Faces     Extremity/Trunk Assessment Upper Extremity Assessment Upper Extremity Assessment: RUE deficits/detail RUE Deficits / Details: PROM WFL, no active movement noted. RUE Sensation: decreased light touch;decreased proprioception RUE Coordination: decreased fine motor;decreased gross motor   Lower Extremity Assessment Lower Extremity Assessment: Defer to PT evaluation   Cervical /  Trunk Assessment Cervical / Trunk Assessment: Kyphotic   Communication Communication Communication: Difficulty communicating thoughts/reduced clarity of speech Following commands: Follows one step commands with increased time;Follows one step commands inconsistently Cueing Techniques: Gestural cues;Verbal cues   Cognition  Arousal: Alert Behavior During Therapy: Flat affect Overall Cognitive Status: No family/caregiver present to determine baseline cognitive functioning Area of Impairment: Following commands                       Following Commands: Follows one step commands inconsistently, Follows one step commands with increased time   Awareness: Intellectual   General Comments: alert, limited by expressive aphasia, needs verbal and tactile cues to attend task     General Comments  incontinent of BM during session, RN /NT notified    Exercises     Shoulder Instructions      Home Living Family/patient expects to be discharged to:: Unsure                                        Prior Functioning/Environment                          OT Problem List:        OT Treatment/Interventions:      OT Goals(Current goals can be found in the care plan section) Acute Rehab OT Goals Patient Stated Goal: none stated OT Goal Formulation: With patient Time For Goal Achievement: 07/25/23 Potential to Achieve Goals: Fair ADL Goals Pt Will Perform Upper Body Dressing: with min assist;sitting Pt Will Perform Lower Body Dressing: with mod assist;bed level;sitting/lateral leans Pt Will Transfer to Toilet: with min assist;squat pivot transfer;stand pivot transfer;bedside commode Additional ADL Goal #1: pt will perform bed mobility CGA in prep for ADLs  OT Frequency: Min 1X/week    Co-evaluation              AM-PAC OT "6 Clicks" Daily Activity     Outcome Measure Help from another person eating meals?: A Lot Help from another person taking care of personal grooming?: A Lot Help from another person toileting, which includes using toliet, bedpan, or urinal?: Total Help from another person bathing (including washing, rinsing, drying)?: A Lot Help from another person to put on and taking off regular upper body clothing?: A Lot Help from another person to put on and  taking off regular lower body clothing?: Total 6 Click Score: 10   End of Session Nurse Communication: Mobility status  Activity Tolerance: Patient tolerated treatment well Patient left: in bed;with call bell/phone within reach;with bed alarm set;with nursing/sitter in room  OT Visit Diagnosis: Unsteadiness on feet (R26.81);Other abnormalities of gait and mobility (R26.89);Muscle weakness (generalized) (M62.81)                Time: 5638-7564 OT Time Calculation (min): 18 min Charges:  OT General Charges $OT Visit: 1 Visit OT Evaluation $OT Eval Moderate Complexity: 1 Mod  Tyleah Loh K, OTD, OTR/L SecureChat Preferred Acute Rehab [phone]   Carver Fila Koonce 07/11/2023, 12:57 PM

## 2023-07-11 NOTE — NC FL2 (Signed)
[place] MEDICAID FL2 LEVEL OF CARE FORM     IDENTIFICATION  Patient Name: Brian Dudley Birthdate: [birth year] Sex: male Admission Date (Current Location): 07/08/2023  [facility] and [place] Number:  Producer, television/film/video and Address:  [facility] [place], [address], [place], [place] [address]      Provider Number: [id number]  Attending Physician Name and Address:  Inez Catalina, MD  Relative Name and Phone Number:       Current Level of Care: Hospital Recommended Level of Care: Skilled Nursing Facility Prior Approval Number:    Date Approved/Denied:   PASRR Number: [id number]  Discharge Plan: SNF    Current Diagnoses: Patient Active Problem List   Diagnosis Date Noted   Rectal cancer (HCC) 07/09/2023   Status epilepticus (HCC) 07/08/2023   Hypertension    Family history of pancreatic cancer    Abnormal brain MRI 06/22/2023   At risk for side effect of medication 06/22/2023   Normocytic anemia 06/21/2023   COPD (chronic obstructive pulmonary disease) (HCC) 05/24/2023   Tobacco abuse 05/24/2023   HLD (hyperlipidemia) 05/24/2023   BPH (benign prostatic hyperplasia) 05/24/2023   Prostate cancer (HCC) 05/24/2023   History of malignant neoplasm of rectosigmoid junction 05/24/2023   Altered mental status 05/24/2023   Colonic diverticular abscess    Perforation of sigmoid colon due to diverticulitis 03/24/2018    Orientation RESPIRATION BLADDER Height & Weight     Self  Normal Incontinent Weight: 153 lb (69.4 kg) Height:  5\' 5"  (165.1 cm)  BEHAVIORAL SYMPTOMS/MOOD NEUROLOGICAL BOWEL NUTRITION STATUS    Convulsions/Seizures Incontinent Diet (see DC summary)  AMBULATORY STATUS COMMUNICATION OF NEEDS Skin   Extensive Assist Verbally Normal                       Personal Care Assistance Level of Assistance  Bathing, Feeding, Dressing Bathing Assistance: Maximum assistance Feeding assistance: Limited assistance Dressing Assistance:  Maximum assistance     Functional Limitations Info  Speech     Speech Info: Impaired (incomprehensible)    SPECIAL CARE FACTORS FREQUENCY  PT (By licensed PT), OT (By licensed OT), Speech therapy     PT Frequency: 5x/wk OT Frequency: 5x/wk     Speech Therapy Frequency: 5x/wk      Contractures Contractures Info: Not present    Additional Factors Info  Code Status, Allergies, Insulin Sliding Scale Code Status Info: Full Allergies Info: NKA   Insulin Sliding Scale Info: see DC summary       Current Medications (07/11/2023):  This is the current hospital active medication list Current Facility-Administered Medications  Medication Dose Route Frequency Provider Last Rate Last Admin   acetaminophen (TYLENOL) tablet 650 mg  650 mg Oral Q6H PRN Annett Fabian, MD   650 mg at 07/09/23 1843   aspirin EC tablet 81 mg  81 mg Oral Daily Annett Fabian, MD   81 mg at 07/11/23 0919   clopidogrel (PLAVIX) tablet 75 mg  75 mg Oral Daily Annett Fabian, MD   75 mg at 07/11/23 0919   divalproex (DEPAKOTE) DR tablet 500 mg  500 mg Oral BID Charlsie Quest, MD       enoxaparin (LOVENOX) injection 40 mg  40 mg Subcutaneous Q24H Marrianne Mood, MD   40 mg at 07/10/23 2158   fluticasone furoate-vilanterol (BREO ELLIPTA) 100-25 MCG/ACT 1 puff  1 puff Inhalation Daily Marrianne Mood, MD   1 puff at 07/11/23 0915   And  umeclidinium bromide (INCRUSE ELLIPTA) 62.5 MCG/ACT 1 puff  1 puff Inhalation Daily Marrianne Mood, MD   1 puff at 07/11/23 0914   folic acid (FOLVITE) tablet 1 mg  1 mg Oral Daily Annett Fabian, MD   1 mg at 07/11/23 0919   levETIRAcetam (KEPPRA) tablet 2,000 mg  2,000 mg Oral BID Charlsie Quest, MD       metoprolol tartrate (LOPRESSOR) tablet 50 mg  50 mg Oral BID Annett Fabian, MD   50 mg at 07/11/23 0919   montelukast (SINGULAIR) tablet 10 mg  10 mg Oral Sharlyne Cai, MD   10 mg at 07/10/23 2145   potassium chloride SA (KLOR-CON M) CR tablet 20 mEq   20 mEq Oral BID Annett Fabian, MD   20 mEq at 07/11/23 0919   rosuvastatin (CRESTOR) tablet 20 mg  20 mg Oral Sharlyne Cai, MD   20 mg at 07/10/23 2145   tamsulosin (FLOMAX) capsule 0.4 mg  0.4 mg Oral Daily Annett Fabian, MD   0.4 mg at 07/11/23 8295   thiamine (VITAMIN B1) injection 100 mg  100 mg Intravenous Daily Marrianne Mood, MD   100 mg at 07/11/23 6213     Discharge Medications: Please see discharge summary for a list of discharge medications.  Relevant Imaging Results:  Relevant Lab Results:   Additional Information SS#: [id number]  [name], [place]

## 2023-07-12 ENCOUNTER — Inpatient Hospital Stay (HOSPITAL_COMMUNITY): Payer: 59

## 2023-07-12 DIAGNOSIS — G40901 Epilepsy, unspecified, not intractable, with status epilepticus: Secondary | ICD-10-CM | POA: Diagnosis not present

## 2023-07-12 LAB — BASIC METABOLIC PANEL
Anion gap: 13 (ref 5–15)
BUN: <5 mg/dL — ABNORMAL LOW (ref 8–23)
CO2: 19 mmol/L — ABNORMAL LOW (ref 22–32)
Calcium: 9 mg/dL (ref 8.9–10.3)
Chloride: 103 mmol/L (ref 98–111)
Creatinine, Ser: 0.56 mg/dL — ABNORMAL LOW (ref 0.61–1.24)
GFR, Estimated: >60 mL/min (ref >60)
Glucose, Bld: 95 mg/dL (ref 70–99)
Potassium: 4 mmol/L (ref 3.5–5.1)
Sodium: 135 mmol/L (ref 135–145)

## 2023-07-12 LAB — AMMONIA: Ammonia: 58 umol/L — ABNORMAL HIGH (ref 9–35)

## 2023-07-12 MED ORDER — LACTATED RINGERS IV SOLN
INTRAVENOUS | Status: AC
  Administered 2023-07-12: 75 mL/h via INTRAVENOUS

## 2023-07-12 MED ORDER — AMLODIPINE BESYLATE 5 MG PO TABS
5.0000 mg | ORAL_TABLET | Freq: Every day | ORAL | Status: DC
  Administered 2023-07-12 – 2023-07-13 (×2): 5 mg via ORAL
  Filled 2023-07-12 (×2): qty 1

## 2023-07-12 MED ORDER — THIAMINE MONONITRATE 100 MG PO TABS
100.0000 mg | ORAL_TABLET | Freq: Every day | ORAL | Status: DC
  Administered 2023-07-13: 100 mg via ORAL
  Filled 2023-07-12: qty 1

## 2023-07-12 MED ORDER — MIDAZOLAM HCL 2 MG/2ML IJ SOLN: 1.0000 mg | INTRAMUSCULAR | Status: DC | PRN

## 2023-07-12 MED ORDER — LORAZEPAM 1 MG PO TABS
1.0000 mg | ORAL_TABLET | Freq: Once | ORAL | Status: AC
  Administered 2023-07-12: 1 mg via ORAL
  Filled 2023-07-12: qty 1

## 2023-07-12 NOTE — Progress Notes (Signed)
HD#4 SUBJECTIVE:  Patient Summary: Brian Dudley is a 62 y.o. male with a pertinent PMH of recent left MCA CVA c/b seizures, Stage IV prostate cancer, history of stage III rectosigmoid cancer s/p resection, HTN, and BPH who presented with worsening aphasia, weakness, and right arm twitching and is admitted for focal status epilepticus.   Overnight Events: None  Interim History: Patient was evaluated at bedside. He is more verbal than yesterday, tracks eyes, but speech still inappropriate. Does not appear in any discomfort this morning.   OBJECTIVE:  Vital Signs: Vitals:   07/11/23 2319 07/12/23 0424 07/12/23 0827 07/12/23 1133  BP: 138/80 (!) 175/89 135/80 120/85  Pulse: 93 (!) 104 (!) 101 96  Resp: 19 18 (!) 21 20  Temp: 98.6 F (37 C) 98.6 F (37 C) 97.7 F (36.5 C) 97.6 F (36.4 C)  TempSrc: Oral Oral Axillary Axillary  SpO2: 100% 99% 97% 97%  Weight:      Height:       Supplemental O2: Room Air SpO2: 97 %  Filed Weights   07/08/23 1230  Weight: 69.4 kg    Intake/Output Summary (Last 24 hours) at 07/12/2023 1140 Last data filed at 07/12/2023 0900 Gross per 24 hour  Intake 120 ml  Output --  Net 120 ml   Net IO Since Admission: -872.7 mL [07/12/23 1140]  Physical Exam: General: laying in bed, in NAD Cardiac: RRR, no m/r/g, no edema Pulmonary: CTA bilaterally, normal effort Abdomen: soft, non-tender, non-distended MSK: normal bulk/tone, ROM still significantly decreased on right, in tact on left.  Neuro: alert, tracking movements with eyes, smiling; persistent aphasia, speech inappropriate; following commands, still significantly weak on right but improving especially in RUE.   Patient Lines/Drains/Airways Status     Active Line/Drains/Airways     Name Placement date Placement time Site Days   Peripheral IV 07/08/23 22 G Antecubital 07/08/23  1514  Antecubital  1            Pertinent Labs:    Latest Ref Rng & Units 07/10/2023    7:34 AM  07/09/2023    7:45 PM 07/08/2023   12:45 PM  CBC  WBC 4.0 - 10.5 K/uL 9.4  10.4  10.3   Hemoglobin 13.0 - 17.0 g/dL 82.9  56.2  13.0   Hematocrit 39.0 - 52.0 % 41.0  38.4  42.2   Platelets 150 - 400 K/uL 310  299  292        Latest Ref Rng & Units 07/12/2023    5:06 AM 07/11/2023    6:16 AM 07/10/2023    7:34 AM  CMP  Glucose 70 - 99 mg/dL 95  82  78   BUN 8 - 23 mg/dL 5  5  5    Creatinine 0.61 - 1.24 mg/dL 8.65  7.84  6.96   Sodium 135 - 145 mmol/L 135  137  136   Potassium 3.5 - 5.1 mmol/L 4.0  4.3  3.4   Chloride 98 - 111 mmol/L 103  105  104   CO2 22 - 32 mmol/L 19  21  23    Calcium 8.9 - 10.3 mg/dL 9.0  8.8  8.9   Total Protein 6.5 - 8.1 g/dL   7.2   Total Bilirubin 0.3 - 1.2 mg/dL   0.7   Alkaline Phos 38 - 126 U/L   214   AST 15 - 41 U/L   16   ALT 0 - 44 U/L   10  No results for input(s): "GLUCAP" in the last 72 hours.   ASSESSMENT/PLAN:  Assessment: Principal Problem:   Status epilepticus (HCC) Active Problems:   COPD (chronic obstructive pulmonary disease) (HCC)   HLD (hyperlipidemia)   BPH (benign prostatic hyperplasia)   Prostate cancer (HCC)   Hypertension   Rectal cancer (HCC)  Plan: Focal Convulsive Status Epilepticus  History of L MCA CVA Neurology following. On keppra 2000 mg BID, depakote 500 mg BID. Strength and ROM improving on exam. Aphasia persists. LTM EEG discontinued by neuro since no continued seizure activity was seen. Will repeat brain imaging today given persistent aphasia to ensure there is no other etiology than the focal seizures and prior stroke. Patient previously at [facility], SNF approval pending.  -MRI brain wo contrast, oral ativan for sedation -Continue keppra 200 mg BID, depakote 500 mg BID -Switched IV thiamine to oral, continue folate -Seizure precautions, neurochecks q2h -Continue PT/OT -IV versed for breakthrough seizures per neuro; d/c with intranasal valtoco 15mg  for seizures -Neurology following, appreciate  recs  Chronic conditions:  Metastatic Prostate Cancer- follows outpatient with Dr. Cherly Hensen. Previous history of radiation and ADT/ARPI, known metastasis to lymph nodes/bones. Has follow up on 10/3 with repeat pet scan. Dr. Cherly Hensen reached out to medicine team, may need to reschedule follow up appointment on 10/3 given patient's hospital course.  HTN- restarted amlodopine 5 mg since BP elevated; restarted metoprolol 50 mg BID Hx CVA, HLD- restarted plavix 75 mg, ASA 81 mg daily, rosuvastatin 20 mg daily BPH- restarted tamsulosin 0.4 mg daily COPD- restarted montelukast 10 mg nightly, trelegy ellipta  Best Practice: Diet: Dys 2 IVF: Fluids: LR 75cc/hr VTE: enoxaparin (LOVENOX) injection 40 mg Start: 07/08/23 2100 Code: Full AB: None DISPO: Anticipated discharge in 1-2 days pending SNF placement.  Signature: Annett Fabian, MD  Internal Medicine Resident, PGY-1 [facility]  Pager: [phone] 11:40 AM, 07/12/2023   Please contact the on call pager after 5 pm and on weekends at [phone].

## 2023-07-12 NOTE — Progress Notes (Signed)
Physical Therapy Treatment Patient Details Name: Brian Dudley MRN: [id number] DOB: 06/16/1961 Today's Date: 07/12/2023   History of Present Illness Pt is a 62 y/o M admitted on 07/08/23 after presenting with c/o worsening aphasia, weakness, & RUE twitching. Pt is admitted for focal status epilepticus. 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 Moving better with less assistance for bed mobility, sitting balance, standing and standing balance. Unable to transfer to chair due to lack of chair alarm. Positioned upright in bed in chair-like position.     If plan is discharge home, recommend the following: Two people to help with walking and/or transfers;Assistance with cooking/housework;Direct supervision/assist for medications management;Direct supervision/assist for financial management;Help with stairs or ramp for entrance;Assist for transportation;Supervision due to cognitive status;Two people to help with bathing/dressing/bathroom;Assistance with feeding   Can travel by private vehicle     No  Equipment Recommendations  Other (comment) (defer to next venue)    Recommendations for Other Services       Precautions / Restrictions Precautions Precautions: Fall Restrictions Weight Bearing Restrictions: No     Mobility  Bed Mobility Overal bed mobility: Needs Assistance Bed Mobility: Supine to Sit, Sit to Supine     Supine to sit: Min assist, HOB elevated Sit to supine: Min assist   General bed mobility comments: pt reaching for therapist's arm to pull self to EOB    Transfers Overall transfer level: Needs assistance   Transfers: Sit to/from Stand Sit to Stand: Min assist           General transfer comment: stood x 4 reps; OOB deferred due to lack of chair alarm on unit    Ambulation/Gait Ambulation/Gait assistance:  Mod assist Gait Distance (Feet): 2 Feet Assistive device: 1 person hand held assist Gait Pattern/deviations: Step-to pattern, Decreased stride length, Decreased dorsiflexion - right, Decreased weight shift to right Gait velocity: reduced     General Gait Details: side step to his left toward [facility]   Stairs             Wheelchair Mobility     Tilt Bed    Modified Rankin (Stroke Patients Only) Modified Rankin (Stroke Patients Only) Pre-Morbid Rankin Score: Slight disability Modified Rankin: Moderately severe disability     Balance Overall balance assessment: Needs assistance Sitting-balance support: Single extremity supported Sitting balance-Leahy Scale: Poor Sitting balance - Comments: sitting balance with CGA including during seated rest breaks between stands   Standing balance support: Reliant on assistive device for balance Standing balance-Leahy Scale: Poor Standing balance comment: reliant on at least single UE support                            Cognition Arousal: Alert Behavior During Therapy: Flat affect Overall Cognitive Status: Difficult to assess                     Current Attention Level: Sustained   Following Commands: Follows one step commands inconsistently, Follows one step commands with increased time     Problem Solving: Slow processing, Requires verbal cues, Requires tactile cues General Comments: alert, limited by expressive aphasia, needs verbal and tactile cues to attend task        Exercises      General Comments General comments (skin integrity, edema, etc.):  Bed wet/soiled on arrival; repeated stands for cleaning pt and changing bed; able to stand up to 2 minutes with min assist      Pertinent Vitals/Pain Pain Assessment Pain Assessment: Faces Faces Pain Scale: No hurt    Home Living                          Prior Function            PT Goals (current goals can now be found in the care plan  section) Acute Rehab PT Goals Patient Stated Goal: "Get out of here" Time For Goal Achievement: 07/24/23 Potential to Achieve Goals: Fair Progress towards PT goals: Progressing toward goals    Frequency    Min 1X/week      PT Plan      Co-evaluation              AM-PAC PT "6 Clicks" Mobility   Outcome Measure  Help needed turning from your back to your side while in a flat bed without using bedrails?: Total Help needed moving from lying on your back to sitting on the side of a flat bed without using bedrails?: A Lot Help needed moving to and from a bed to a chair (including a wheelchair)?: A Lot Help needed standing up from a chair using your arms (e.g., wheelchair or bedside chair)?: A Little Help needed to walk in hospital room?: Total (<20 ft) Help needed climbing 3-5 steps with a railing? : Total 6 Click Score: 10    End of Session Equipment Utilized During Treatment: Gait belt Activity Tolerance: Patient tolerated treatment well Patient left: in bed;with call bell/phone within reach;with bed alarm set Nurse Communication: Mobility status;Other (comment) (condom catheter off on arrival) PT Visit Diagnosis: Difficulty in walking, not elsewhere classified (R26.2);Muscle weakness (generalized) (M62.81);Other abnormalities of gait and mobility (R26.89);Unsteadiness on feet (R26.81);Hemiplegia and hemiparesis Hemiplegia - Right/Left: Right Hemiplegia - caused by: Other cerebrovascular disease     Time: 0903-0920 PT Time Calculation (min) (ACUTE ONLY): 17 min  Charges:    $Therapeutic Activity: 8-22 mins PT General Charges $$ ACUTE PT VISIT: 1 Visit                      Jerolyn Center, PT Acute Rehabilitation Services  Office [phone]    Zena Amos 07/12/2023, 9:56 AM

## 2023-07-12 NOTE — Plan of Care (Signed)

## 2023-07-12 NOTE — Progress Notes (Signed)
Patient has been confused and attempted to climb over side rails twice and got out of bed and stood on one leg.  Patient pulled out his PIV and keep saying he was at [facility]. Tele sitter ordered. Vonna Drafts, MD.  No new orders.

## 2023-07-12 NOTE — Progress Notes (Signed)
Subjective: No seizure-like activity.  However per RN patient stood up in the bed and had a bowel incontinence this morning.  ROS: Unable to obtain due to poor mental status  Examination  Vital signs in last 24 hours: Temp:  [97.6 F (36.4 C)-98.6 F (37 C)] 97.7 F (36.5 C) (10/02 0827) Pulse Rate:  [88-104] 101 (10/02 0827) Resp:  [16-21] 21 (10/02 0827) BP: (115-175)/(77-89) 135/80 (10/02 0827) SpO2:  [97 %-100 %] 97 % (10/02 0827)  General: lying in bed, NAD Neuro: Awake, alert, looks at examiner and tracks examiner in room, perseverating on brain and dreams, did not follow any command, pupils equally round and reactive, no apparent facial asymmetry, spontaneously moving left upper and left lower extremity, did move right upper extremity with antigravity strength, did move right lower extremity against gravity  Basic Metabolic Panel: Recent Labs  Lab 07/08/23 1245 07/08/23 1412 07/09/23 1945 07/10/23 0734 07/11/23 0616 07/12/23 0506  NA 140  --  140 136 137 135  K 3.8  --  3.6 3.4* 4.3 4.0  CL 100  --  103 104 105 103  CO2 23  --  25 23 21* 19*  GLUCOSE 117*  --  127* 78 82 95  BUN 5*  --  5* 5* 5* <5*  CREATININE 0.56*  --  0.69 0.78 0.51* 0.56*  CALCIUM 9.7  --  9.2 8.9 8.8* 9.0  MG  --  2.1  --   --   --   --     CBC: Recent Labs  Lab 07/08/23 1245 07/09/23 1945 07/10/23 0734  WBC 10.3 10.4 9.4  HGB 14.1 12.3* 13.2  HCT 42.2 38.4* 41.0  MCV 84.2 83.7 84.7  PLT 292 299 310     Coagulation Studies: No results for input(s): "LABPROT", "INR" in the last 72 hours.  Imaging No new brain imaging overnight  ASSESSMENT AND PLAN: 62 year old male with a history of left MCA stroke and seizures, on Keppra 1000 mg twice daily at baseline, history of hypertension and prostate cancer.  Admitted for AMS and right upper extremity twitching concerning for focal seizure.  Loaded with fosphenytoin but clinical and electrographic seizure finally broke with Depakote load.     Focal convulsive status epilepticus, resolved -Etiology of status epilepticus: Unclear   Recommendations -Continue Keppra 2000 mg twice daily and Depakote 250 mg every 6 hours. -Will check ammonia as patient is on Depakote and still aphasic -At baseline patient is able to have normal communication.  Therefore will obtain MRI brain without contrast.  May need IV Ativan as patient may not be able to cooperate/lay still for MRI -Patient is still aphasic.  As on his MRI brain does not show any acute abnormality, aphasia might take a few days to improve -Continue seizure precautions -As needed IV versed for breakthrough seizure while in the hospital -Rescue medication at the time of discharge: intranasal valtoco 15mg  for seizure lasting over 2 minutes -Discussed plan with medicine team   I have spent a total of  36  minutes with the patient reviewing hospital notes,  test results, labs and examining the patient as well as establishing an assessment and plan .  > 50% of time was spent in direct patient care.    Lindie Spruce Epilepsy [facility] For questions after 5pm please refer to AMION to reach the Neurologist on call

## 2023-07-12 NOTE — Progress Notes (Signed)
SLP Cancellation Note  Patient Details Name: Brian Dudley MRN: [id number] DOB: 30-Sep-1961   Cancelled treatment:       Reason Eval/Treat Not Completed: Pt  covered in stool and agitated; notified staff so he can be bathed. Will follow for cognitive assessment.   Blenda Mounts Laurice 07/12/2023, 2:32 PM

## 2023-07-13 ENCOUNTER — Other Ambulatory Visit: Payer: Self-pay

## 2023-07-13 ENCOUNTER — Inpatient Hospital Stay: Payer: 59

## 2023-07-13 ENCOUNTER — Other Ambulatory Visit (HOSPITAL_COMMUNITY): Payer: Self-pay

## 2023-07-13 ENCOUNTER — Ambulatory Visit: Payer: 59 | Admitting: Physical Therapy

## 2023-07-13 DIAGNOSIS — G40901 Epilepsy, unspecified, not intractable, with status epilepticus: Secondary | ICD-10-CM | POA: Diagnosis not present

## 2023-07-13 MED ORDER — VALTOCO 15 MG DOSE 7.5 MG/0.1ML NA LQPK: 15.0000 mg | NASAL | 0 refills | Status: AC

## 2023-07-13 MED ORDER — LEVETIRACETAM 1000 MG PO TABS
2000.0000 mg | ORAL_TABLET | Freq: Two times a day (BID) | ORAL | 0 refills | Status: AC
Start: 2023-07-13 — End: 2023-08-12
  Filled 2023-07-13: qty 90, 23d supply, fill #0

## 2023-07-13 MED ORDER — VALTOCO 15 MG DOSE 7.5 MG/0.1ML NA LQPK
15.0000 mg | NASAL | 0 refills | Status: DC
  Filled 2023-07-13: qty 2, 28d supply, fill #0

## 2023-07-13 NOTE — Progress Notes (Signed)
Patient discharged to SNF [facility], transported by [facility].

## 2023-07-13 NOTE — Evaluation (Signed)
Speech Language Pathology Evaluation Patient Details Name: Brian Dudley MRN: [id number] DOB: September 22, 1961 Today's Date: 07/13/2023 Time: 7829-5621 SLP Time Calculation (min) (ACUTE ONLY): 12 min  Problem List:  Patient Active Problem List   Diagnosis Date Noted   Rectal cancer (HCC) 07/09/2023   Status epilepticus (HCC) 07/08/2023   Hypertension    Family history of pancreatic cancer    Abnormal brain MRI 06/22/2023   At risk for side effect of medication 06/22/2023   Normocytic anemia 06/21/2023   COPD (chronic obstructive pulmonary disease) (HCC) 05/24/2023   Tobacco abuse 05/24/2023   HLD (hyperlipidemia) 05/24/2023   BPH (benign prostatic hyperplasia) 05/24/2023   Prostate cancer (HCC) 05/24/2023   History of malignant neoplasm of rectosigmoid junction 05/24/2023   Altered mental status 05/24/2023   Colonic diverticular abscess    Perforation of sigmoid colon due to diverticulitis 03/24/2018   Past Medical History:  Past Medical History:  Diagnosis Date   BPH (benign prostatic hyperplasia)    COPD with emphysema (HCC)    CVA (cerebral vascular accident) (HCC)    Diverticulitis    Family history of pancreatic cancer    History of alcohol abuse    History of colon cancer    History of tobacco abuse    Hypertension    Prostate cancer [facility])    Past Surgical History:  Past Surgical History:  Procedure Laterality Date   CAROTID ENDARTERECTOMY Left 05/11/2023   TONSILLECTOMY     HPI:  Pt is a 62 y/o M admitted on 07/08/23 after presenting with c/o worsening aphasia, weakness, & RUE twitching. Pt is admitted for focal status epilepticus. PMH: recent L MCA CVA (April 2024) c/b seizures, Stage IV prostate CA, stage 3 rectosigmoid CA s/p resection, HTN, BPH, COPD   Assessment / Plan / Recommendation Clinical Impression  Pt presents with a mixed expressive/receptive aphasia most consistent with Wernicke's Aphasia.  Pt was encountered awake/alert in bed.  He was noted to  be conversing with fluent speech upon SLP arrival; however, pt was utilizing language of confusion with frequent perseveration.  He was unable to state name or complete additional automatic speech tasks.  He was unable to follow 1-step commands, answer yes/no questions, or complete confrontational naming tasks.  Speech intelligibility was reduced, approximately 65% intelligible to an unfamiliar listener.  Recommend continued ST acutely and at time of discharge targeting aphasia.    SLP Assessment  SLP Recommendation/Assessment: Patient needs continued Speech Lanaguage Pathology Services SLP Visit Diagnosis: Aphasia (R47.01)    Recommendations for follow up therapy are one component of a multi-disciplinary discharge planning process, led by the attending physician.  Recommendations may be updated based on patient status, additional functional criteria and insurance authorization.    Follow Up Recommendations  Home health SLP    Assistance Recommended at Discharge  Frequent or constant Supervision/Assistance  Functional Status Assessment Patient has had a recent decline in their functional status and demonstrates the ability to make significant improvements in function in a reasonable and predictable amount of time.  Frequency and Duration min 2x/week  2 weeks      SLP Evaluation Cognition  Overall Cognitive Status: Difficult to assess Arousal/Alertness: Awake/alert       Comprehension  Auditory Comprehension Overall Auditory Comprehension: Impaired Yes/No Questions: Impaired Basic Biographical Questions: 0-25% accurate Basic Immediate Environment Questions: 0-24% accurate Commands: Impaired One Step Basic Commands: 0-24% accurate    Expression Expression Primary Mode of Expression: Verbal Verbal Expression Overall Verbal Expression: Impaired at baseline Initiation:  No impairment Level of Generative/Spontaneous Verbalization: Phrase Repetition: Impaired Level of Impairment: Word  level Naming: Impairment Responsive: Not tested Confrontation: Impaired Convergent: 0-24% accurate Verbal Errors: Language of confusion;Perseveration;Not aware of errors Pragmatics: Impairment Impairments: Eye contact   Oral / Motor  Oral Motor/Sensory Function Overall Oral Motor/Sensory Function: Other (comment) (Pt unable to follow commands) Motor Speech Overall Motor Speech: Impaired Intelligibility: Intelligibility reduced Word: 75-100% accurate Phrase: 50-74% accurate           Eino Farber, M.S., CCC-SLP Acute Rehabilitation Services Office: [phone]  Shanon Rosser Akiko Schexnider 07/13/2023, 1:55 PM

## 2023-07-13 NOTE — TOC Progression Note (Signed)
Transition of Care [facility]) - Progression Note    Patient Details  Name: Brian Dudley MRN: [id number] Date of Birth: 01-13-1961  [facility] CM/SW Contact  [name], [place] Phone Number: 07/13/2023, 9:58 AM  Clinical Narrative:   CSW discussed disposition update with MDs, can initiate insurance authorization for SNF. CSW asked CMA to initiate insurance authorization. CSW verified with [facility] that bed is available when Berkley Harvey is received. CSW to folow.    Expected Discharge Plan: Skilled Nursing Facility Barriers to Discharge: Continued Medical Work up, English as a second language teacher  Expected Discharge Plan and Services     Post Acute Care Choice: Skilled Nursing Facility Living arrangements for the past 2 months: Single Family Home                                       Social Determinants of Health (SDOH) Interventions SDOH Screenings   Food Insecurity: Patient Unable To Answer (05/24/2023)  Housing: Patient Unable To Answer (05/24/2023)  Transportation Needs: No Transportation Needs (09/09/2019)   Received from [facility]  Utilities: Patient Unable To Answer (05/24/2023)  Financial Resource Strain: Low Risk  (09/09/2019)   Received from [facility]  Physical Activity: Inactive (11/07/2018)   Received from [facility]  Social Connections: Unknown (12/06/2022)   Received from [facility]  Stress: Stress Concern Present (11/07/2018)   Received from [facility]  Tobacco Use: High Risk (07/08/2023)    Readmission Risk Interventions     No data to display

## 2023-07-13 NOTE — Progress Notes (Addendum)
Subjective: No seizures overnight.  I was able to call and speak with the patient daughter who is also a Engineer, civil (consulting).  Daughter states when she is with patient, he does attempt to communicate and watch TV with her.  However prior to this admission he was fully conversational and currently on my assessment patient continues to be aphasic (not following any commands).  He does continue to mumble words  ROS: Unable to obtain due to poor mental status  Examination  Vital signs in last 24 hours: Temp:  [97.6 F (36.4 C)-99 F (37.2 C)] 98.6 F (37 C) (10/03 1114) Pulse Rate:  [96-110] 102 (10/03 1114) Resp:  [18-20] 19 (10/03 1114) BP: (119-153)/(73-90) 142/89 (10/03 1114) SpO2:  [97 %-100 %] 100 % (10/03 1114)  General: lying in bed, NAD Neuro: Awake, alert, looks at examiner and tracks examiner in room, mumbling words but did not follow any command, did not repeat, pupils equally round and reactive, no apparent facial asymmetry, spontaneously moving left upper and left lower extremity, did move right upper extremity with antigravity strength, did move right lower extremity against gravity  Basic Metabolic Panel: Recent Labs  Lab 07/08/23 1245 07/08/23 1412 07/09/23 1945 07/10/23 0734 07/11/23 0616 07/12/23 0506  NA 140  --  140 136 137 135  K 3.8  --  3.6 3.4* 4.3 4.0  CL 100  --  103 104 105 103  CO2 23  --  25 23 21* 19*  GLUCOSE 117*  --  127* 78 82 95  BUN 5*  --  5* 5* 5* <5*  CREATININE 0.56*  --  0.69 0.78 0.51* 0.56*  CALCIUM 9.7  --  9.2 8.9 8.8* 9.0  MG  --  2.1  --   --   --   --     CBC: Recent Labs  Lab 07/08/23 1245 07/09/23 1945 07/10/23 0734  WBC 10.3 10.4 9.4  HGB 14.1 12.3* 13.2  HCT 42.2 38.4* 41.0  MCV 84.2 83.7 84.7  PLT 292 299 310     Coagulation Studies: No results for input(s): "LABPROT", "INR" in the last 72 hours.  Imaging personally reviewed MRI brain without contrast 07/12/2023: Motion degraded exam.  No acute abnormality. Old infarcts and  chronic ischemic microangiopathy.   ASSESSMENT AND PLAN:  62 year old male with a history of left MCA stroke and seizures, on Keppra 1000 mg twice daily at baseline, history of hypertension and prostate cancer.  Admitted for AMS and right upper extremity twitching concerning for focal seizure.  Loaded with fosphenytoin but clinical and electrographic seizure finally broke with Depakote load.    Focal convulsive status epilepticus, resolved Hyperammonemia due to Depakote -Etiology of status epilepticus: Unclear   Recommendations -Continue Keppra 2000 mg twice daily -Will DC Depakote due to hyperammonemia -Patient was admitted in August and had aphasia at that time, however quickly recovered.  This time the seizures lasting a little longer.  He did have LPDs on eeg but no definite seizures.  With no restricted diffusion on MRI, I suspect this is prolonged postictal aphasia.  I talked to patient's daughter who is also a nurse who states he has been communicating a little bit more when she is around but does not communicate with staff.  Therefore, I am okay with discharging patient to SNF.  I did tell patient's daughter that if over the next 1 week patient's aphasia does not improve, would like to see patient in clinic with Dr. Karel Jarvis.  May need to consider  outpatient EEG and adjusting antiseizure medications again at that time. -I have messaged Dr. Karel Jarvis to update her about the plan -Plan also discussed with patient daughter on phone as well as medicine team by secure chat -Continue seizure precautions -As needed IV versed for breakthrough seizure while in the hospital -Rescue medication at the time of discharge: intranasal valtoco 15mg  for seizure lasting over 2 minutes  Seizure precautions: Per [facility] statutes, patients with seizures are not allowed to drive until they have been seizure-free for six months and cleared by a physician    Use caution when using heavy equipment or power  tools. Avoid working on ladders or at heights. Take showers instead of baths. Ensure the water temperature is not too high on the home water heater. Do not go swimming alone. Do not lock yourself in a room alone (i.e. bathroom). When caring for infants or small children, sit down when holding, feeding, or changing them to minimize risk of injury to the child in the event you have a seizure. Maintain good sleep hygiene. Avoid alcohol.    If patient has another seizure, call 911 and bring them back to the ED if: A.  The seizure lasts longer than 5 minutes.      B.  The patient doesn't wake shortly after the seizure or has new problems such as difficulty seeing, speaking or moving following the seizure C.  The patient was injured during the seizure D.  The patient has a temperature over 102 F (39C) E.  The patient vomited during the seizure and now is having trouble breathing    During the Seizure   - First, ensure adequate ventilation and place patients on the floor on their left side  Loosen clothing around the neck and ensure the airway is patent. If the patient is clenching the teeth, do not force the mouth open with any object as this can cause severe damage - Remove all items from the surrounding that can be hazardous. The patient may be oblivious to what's happening and may not even know what he or she is doing. If the patient is confused and wandering, either gently guide him/her away and block access to outside areas - Reassure the individual and be comforting - Call 911. In most cases, the seizure ends before EMS arrives. However, there are cases when seizures may last over 3 to 5 minutes. Or the individual may have developed breathing difficulties or severe injuries. If a pregnant patient or a person with diabetes develops a seizure, it is prudent to call an ambulance.    After the Seizure (Postictal Stage)   After a seizure, most patients experience confusion, fatigue, muscle pain and/or  a headache. Thus, one should permit the individual to sleep. For the next few days, reassurance is essential. Being calm and helping reorient the person is also of importance.   Most seizures are painless and end spontaneously. Seizures are not harmful to others but can lead to complications such as stress on the lungs, brain and the heart. Individuals with prior lung problems may develop labored breathing and respiratory distress.     I have spent a total of  39 minutes with the patient reviewing hospital notes,  test results, labs and examining the patient as well as establishing an assessment and plan .  > 50% of time was spent in direct patient care.    [place] Epilepsy [facility] For questions after 5pm please refer to AMION to reach  the Neurologist on call

## 2023-07-13 NOTE — TOC Transition Note (Signed)
Transition of Care [facility]) - CM/SW Discharge Note   Patient Details  Name: Brian Dudley MRN: [id number] Date of Birth: 03-28-1961  Transition of Care [facility]) CM/SW Contact:  [name], LCSW Phone Number: 07/13/2023, 2:23 PM   Clinical Narrative:   CSW received insurance approval for patient to admit to SNF, updated MD. CSW sent discharge information to [facility] and confirmed patient ready to admit. CSW updated daughter, [name], she is in agreement. Transport arranged with [facility] for next available.  Nurse to call report to [phone], Room 210.    Final next level of care: Skilled Nursing Facility Barriers to Discharge: Barriers Resolved   Patient Goals and CMS Choice CMS Medicare.gov Compare Post Acute Care list provided to:: Patient Represenative (must comment) Choice offered to / list presented to : Adult Children  Discharge Placement                Patient [facility] bed at: [facility] Patient to be transferred to facility by: [facility] Name of family member notified: [name] Patient and family notified of of transfer: 07/13/23  Discharge Plan and Services Additional resources added to the After Visit Summary for       Post Acute Care Choice: Skilled Nursing Facility                               Social Determinants of Health (SDOH) Interventions SDOH Screenings   Food Insecurity: Patient Unable To Answer (05/24/2023)  Housing: Patient Unable To Answer (05/24/2023)  Transportation Needs: No Transportation Needs (09/09/2019)   Received [facility]  Utilities: Patient Unable To Answer (05/24/2023)  Financial Resource Strain: Low Risk  (09/09/2019)   Received from [facility]  Physical Activity: Inactive (11/07/2018)   Received from [facility]  Social Connections: Unknown (12/06/2022)   Received from [facility]  Stress: Stress Concern Present (11/07/2018)   Received from [facility]   Tobacco Use: High Risk (07/08/2023)     Readmission Risk Interventions     No data to display

## 2023-07-13 NOTE — Discharge Summary (Signed)
Name: Brian Dudley MRN: [id number] DOB: [birth year] 62 y.o. PCP: Jerrell Belfast, MD  Date of Admission: 07/08/2023 12:13 PM Date of Discharge:  07/13/2023 Attending Physician: Dr. Criselda Peaches  DISCHARGE DIAGNOSIS:  Primary Problem: Status epilepticus [facility])   Hospital Problems: Principal Problem:   Status epilepticus (HCC) Active Problems:   COPD (chronic obstructive pulmonary disease) (HCC)   HLD (hyperlipidemia)   BPH (benign prostatic hyperplasia)   Prostate cancer (HCC)   Hypertension   Rectal cancer (HCC)    DISCHARGE MEDICATIONS:   Allergies as of 07/13/2023       Reactions   Valproic Acid And Related Other (See Comments)   Hyperammonemia ( 58) at 500mg  BID VPA        Medication List     TAKE these medications    acetaminophen 325 MG tablet Commonly known as: TYLENOL Take 2 tablets (650 mg total) by mouth every 6 (six) hours as needed for mild pain (or Fever >/= 101).   amLODipine 5 MG tablet Commonly known as: NORVASC Take 5 mg by mouth at bedtime.   aspirin EC 81 MG tablet Take 1 tablet (81 mg total) by mouth daily. Swallow whole.   clopidogrel 75 MG tablet Commonly known as: PLAVIX Take 75 mg by mouth daily.   folic acid 1 MG tablet Commonly known as: FOLVITE Take 1 tablet (1 mg total) by mouth daily.   levETIRAcetam 1000 MG tablet Commonly known as: KEPPRA Take 2 tablets (2,000 mg total) by mouth 2 (two) times daily. What changed: how much to take   metoprolol tartrate 50 MG tablet Commonly known as: LOPRESSOR Take 1 tablet (50 mg total) by mouth 2 (two) times daily.   montelukast 10 MG tablet Commonly known as: SINGULAIR Take 10 mg by mouth at bedtime.   multivitamin with minerals Tabs tablet Take 1 tablet by mouth daily.   rosuvastatin 20 MG tablet Commonly known as: CRESTOR Take 20 mg by mouth at bedtime.   tamsulosin 0.4 MG Caps capsule Commonly known as: FLOMAX Take 0.4 mg by mouth daily.   thiamine 100 MG  tablet Commonly known as: VITAMIN B1 Take 1 tablet (100 mg total) by mouth daily.   Trelegy Ellipta 100-62.5-25 MCG/ACT Aepb Generic drug: Fluticasone-Umeclidin-Vilant Inhale 1 puff into the lungs daily.   Valtoco 15 MG Dose 7.5 MG/0.1ML Lqpk Generic drug: diazePAM (15 MG Dose) Place 15 mg into the nose as directed. Place 15 mg into the nose for seizures lasting longer than 2 minutes.       DISPOSITION AND FOLLOW-UP:  Brian Dudley was discharged from [facility] in [place] condition. At the hospital follow up visit please address:  Focal Convulsive Status Epilepticus, resolved History of CVA: ensure adherence to keppra 2000 mg BID. Screen for new seizures. Educate about use of intranasal valtoco for breakthrough seizure. Ensure he is taking DAPT daily.   Hypertension: recheck BP outpatient and modify medications as appropriate.   Metastatic prostate cancer: ensure patient follows up with Dr. Cherly Hensen, his oncologist.   Follow-up Recommendations: Labs: BMP Studies: None Medications: Keppra 2000 mg BID, intranasal valtoco 15 mg for breakthrough seizures lasting over 2 minutes  Follow-up Appointments:  Contact information for after-discharge care     Destination     [facility] Preferred SNF .   Service: Skilled Paramedic information: [address] [place] [address] [phone]  HOSPITAL COURSE:  Patient Summary: Brian Dudley is a 62 y.o. male with hx of prior L MCA stroke c/b seizures on Keppra 1000mg  BID at home with recent EEG showing L posterior quadrant spikes, hx of COPD, CVA, HTN, BPH, prostate cancer stage IV, history of stage III rectosigmoid cancer who presented with aphasia out of proportion to confusion and intermittent RUE twitching.  Focal Convulsive Status Epilepticus, resolved History of L MCA CVA He was brought to the emergency department by his  family who was concerned for severe pain, intermittent right upper extremity twitching and aphasia.  CT head showed no new abnormalities. MRI brain was limited by patient motion despite oral sedation, but also demonstrated no new findings since his recent stroke. Neurology was consulted given his history of stroke and new neurologic symptoms, and they followed him throughout his hospital stay.  He was given Ativan and a loading dose of valproic acid, which resolved his clinical jerking. A LTM EEG was started, which showed evidence of epileptogenicity and cortical dysfunction arising from left hemisphere, maximal left posterior quadrant likely secondary to underlying stroke. His Keppra was increased to 1500 mg twice daily and a maintenance dose of valproic acid of 250 mg every 6 hours was initiated. He was also loaded with fosphenytoin. His seizures ceased. He remained significantly weak with motor deficits in the right upper and lower extremities with persistent aphasia.  Neurology stopped his valproic acid for concerns of hyperammonemia and increased his Keppra to 2000 mg twice daily. He was prescribed intranasal Valtoco for breakthrough seizures lasting more than 2 minutes.  Since he was seizure free for several days on his current medications and is beginning to show improvement in his speech and motor function, he was deemed stable for discharge to a skilled nursing facility for continued care.   DISCHARGE INSTRUCTIONS:   Discharge Instructions     Call MD for:  difficulty breathing, headache or visual disturbances   Complete by: As directed    Call MD for:  extreme fatigue   Complete by: As directed    Call MD for:  persistant dizziness or light-headedness   Complete by: As directed    Call MD for:  persistant nausea and vomiting   Complete by: As directed    Call MD for:  severe uncontrolled pain   Complete by: As directed    Discharge instructions   Complete by: As directed    You were  hospitalized for focal convulsive seizures. Thank you for allowing [place] to be part of your care. You are now medically stable and ready for continued care at [facility] skilled nursing facility.   Please note these changes made to your medications:   Please START taking: Keppra 2000 mg (2 tablets) twice daily. We are prescribing an intranasal medication called valtoco, which should only be used if you experience seizures lasting longer than 2 minutes.   Please STOP taking: your previous Keppra dose.   You may continue taking the rest of your home medications as prescribed.   Please make sure to schedule a hospital follow up appointment with your PCP, Brian Dudley, Brian Gitelman, MD in the next week or two.   Please call our clinic if you have any questions or concerns, we may be able to help and keep you from a long and expensive emergency room wait. Our clinic and after hours phone number is [phone], the best time to call is Monday through Friday 9 am to 4 pm but there is  always someone available 24/7 if you have an emergency. If you need medication refills please notify your pharmacy one week in advance and they will send [place] a request.   Increase activity slowly   Complete by: As directed        SUBJECTIVE:   Patient was evaluated at bedside.  He continues to be aphasic, speaking more and more each day but it is still inappropriate.  Motor deficits unchanged from yesterday.  He does not appear to be in any discomfort.  Daughter is bedside, updated her on the plan.  Discharge Vitals:   BP (!) 142/89 (BP Location: Left Arm)   Pulse (!) 102   Temp 98.6 F (37 C) (Oral)   Resp 19   Ht 5\' 5"  (1.651 m)   Wt 69.4 kg   SpO2 100%   BMI 25.46 kg/m   OBJECTIVE:  Physical Exam Eyes:     Extraocular Movements: Extraocular movements intact.     Pupils: Pupils are equal, round, and reactive to light.  Cardiovascular:     Rate and Rhythm: Normal rate and regular rhythm.     Pulses: Normal  pulses.     Heart sounds: Normal heart sounds.  Pulmonary:     Effort: Pulmonary effort is normal.     Breath sounds: Normal breath sounds.  Abdominal:     General: Abdomen is flat.     Palpations: Abdomen is soft.  Musculoskeletal:     Comments: ROM decreased in right upper and lower extremities. Unable to hold extremities against gravity.   Skin:    General: Skin is warm and dry.  Neurological:     Mental Status: He is alert. He is disoriented.     Cranial Nerves: No cranial nerve deficit.     Motor: Weakness present.     Comments: Alert, able to track provider in room, can follow commands but selectively, mumbles words but relatively nonsensical, reacts to conversation. No facial asymmetry. PEERL. Spontaneously moves left UE and LE, does not move right UE and LE.          Pertinent Labs, Studies, and Procedures:     Latest Ref Rng & Units 07/10/2023    7:34 AM 07/09/2023    7:45 PM 07/08/2023   12:45 PM  CBC  WBC 4.0 - 10.5 K/uL 9.4  10.4  10.3   Hemoglobin 13.0 - 17.0 g/dL 08.6  57.8  46.9   Hematocrit 39.0 - 52.0 % 41.0  38.4  42.2   Platelets 150 - 400 K/uL 310  299  292        Latest Ref Rng & Units 07/12/2023    5:06 AM 07/11/2023    6:16 AM 07/10/2023    7:34 AM  CMP  Glucose 70 - 99 mg/dL 95  82  78   BUN 8 - 23 mg/dL 5  5  5    Creatinine 0.61 - 1.24 mg/dL 6.29  5.28  4.13   Sodium 135 - 145 mmol/L 135  137  136   Potassium 3.5 - 5.1 mmol/L 4.0  4.3  3.4   Chloride 98 - 111 mmol/L 103  105  104   CO2 22 - 32 mmol/L 19  21  23    Calcium 8.9 - 10.3 mg/dL 9.0  8.8  8.9   Total Protein 6.5 - 8.1 g/dL   7.2   Total Bilirubin 0.3 - 1.2 mg/dL   0.7   Alkaline Phos 38 - 126 U/L   214  AST 15 - 41 U/L   16   ALT 0 - 44 U/L   10     Overnight EEG with video  Result Date: 07/09/2023 Charlsie Quest, MD     07/10/2023  9:16 AM Patient Name: RAHIL REH MRN: [id number] Epilepsy Attending: Charlsie Quest Referring Physician/Provider: Erick Blinks, MD  Duration: 07/08/2023 2209 to 07/09/2023 2209 Patient history: 62 y.o. male with hx of prior L MCA stroke c/b seizures on Keppra 1000mg  BID with recent EEG showing L posterior quadrant spikes, who presents with aphasia out of proportion to confusion and intermittent RUE twitching. EEG to evaluate for seizure Level of alertness: Awake, asleep AEDs during EEG study: LEV, VPA, Ativan Technical aspects: This EEG study was done with scalp electrodes positioned according to the 10-20 International system of electrode placement. Electrical activity was reviewed with band pass filter of 1-70Hz , sensitivity of 7 uV/mm, display speed of 40mm/sec with a 60Hz  notched filter applied as appropriate. EEG data were recorded continuously and digitally stored.  Video monitoring was available and reviewed as appropriate. Description: The posterior dominant rhythm consists of 8 Hz activity of moderate voltage (25-35 uV) seen predominantly in posterior head regions, asymmetric ( left<right) and reactive to eye opening and eye closing. Sleep was characterized by sleep spindles (12 to 15 Hz), maximal frontocentral region. EEG showed continuous 3 to 5 Hz theta-delta slowing in left hemisphere as well as intermittent generalized 3-6hz  theta-delta slowing. Lateralized periodic discharges with overriding fast activity were noted in left hemisphere, maximal left posterior quadrant at 1Hz . Clinically, patient had right upper extremity jerking per Dr Derry Lory ( difficult to see on camera). 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 Patient was loaded with IV Valproic acid on 07/08/2023 at 2315. Subsequently clinical jerking resolved. EEG continued to show lateralized periodic discharges with overriding fast activity were noted in left hemisphere, maximal left posterior quadrant at 1-1.5Hz .  ABNORMALITY - Focal convulsive status epilepticus, left posterior quadrant. - Lateralized  periodic discharges with overriding fast activity, left hemisphere, maximal left posterior quadrant ( LPD+F) - Continuous slow, left hemisphere - Intermittent slow, generalized - Background asymmetry, left<right  IMPRESSION: At the beginning of the study, patient reportedly had right upper extremity jerking. 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  Patient was loaded with IV Valproic acid on 07/08/2023 at 2315. Subsequently clinical jerking resolved. EEG continued to show evidence of epileptogenicity arising from left hemisphere, maximal left posterior quadrant with increased risk of seizure recurrence. Additionally there was cortical dysfunction arising from left hemisphere likely secondary to underlying stroke. Lastly there was moderate diffuse encephalopathy.  Charlsie Quest   CT Head Wo Contrast  Result Date: 07/08/2023 CLINICAL DATA:  Mental status changes. EXAM: CT HEAD WITHOUT CONTRAST TECHNIQUE: Contiguous axial images were obtained from the base of the skull through the vertex without intravenous contrast. RADIATION DOSE REDUCTION: This exam was performed according to the departmental dose-optimization program which includes automated exposure control, adjustment of the mA and/or kV according to patient size and/or use of iterative reconstruction technique. COMPARISON:  MRI brain 07/05/2023 FINDINGS: Brain: There is no evidence for acute hemorrhage, hydrocephalus, mass lesion, or abnormal extra-axial fluid collection. No definite CT evidence for acute infarction. Patchy low attenuation in the deep hemispheric and periventricular white matter is nonspecific, but likely reflects chronic microvascular ischemic demyelination. Old left MCA territory infarct  again noted. Vascular: No hyperdense vessel or unexpected calcification. Skull: No evidence for fracture. No worrisome lytic or sclerotic  lesion. Sinuses/Orbits: The visualized paranasal sinuses and mastoid air cells are clear. Visualized portions of the globes and intraorbital fat are unremarkable. Other: None. IMPRESSION: 1. No acute intracranial abnormality. 2. Old left MCA territory infarct. 3. Chronic small vessel white matter ischemic disease. Electronically Signed   By: Kennith Center M.D.   On: 07/08/2023 16:31   DG Chest Portable 1 View  Result Date: 07/08/2023 CLINICAL DATA:  Altered mental status. EXAM: PORTABLE CHEST 1 VIEW COMPARISON:  Chest radiograph dated 05/24/2023. FINDINGS: The heart size and mediastinal contours are within normal limits. Mild left basilar atelectasis/airspace disease. No significant pleural effusion or pneumothorax. Degenerative changes are seen in the spine. IMPRESSION: Mild left basilar atelectasis/airspace disease. Electronically Signed   By: Romona Curls M.D.   On: 07/08/2023 14:49     Signed: Annett Fabian, MD Internal Medicine Resident, PGY-1 [facility]  Pager: [phone] 12:56 PM, 07/13/2023

## 2023-07-13 NOTE — Plan of Care (Signed)

## 2023-07-13 NOTE — Plan of Care (Signed)

## 2023-07-14 ENCOUNTER — Encounter: Payer: Self-pay | Admitting: Genetic Counselor

## 2023-07-14 DIAGNOSIS — Z1379 Encounter for other screening for genetic and chromosomal anomalies: Secondary | ICD-10-CM | POA: Insufficient documentation

## 2023-07-18 ENCOUNTER — Encounter (HOSPITAL_COMMUNITY): Payer: Self-pay

## 2023-07-18 ENCOUNTER — Other Ambulatory Visit: Payer: Self-pay

## 2023-07-18 ENCOUNTER — Emergency Department (HOSPITAL_COMMUNITY)
Admission: EM | Admit: 2023-07-18 | Discharge: 2023-07-18 | Disposition: A | Discharge status: Home / Self Care | Payer: 59 | Attending: Emergency Medicine | Admitting: Emergency Medicine

## 2023-07-18 ENCOUNTER — Emergency Department (HOSPITAL_COMMUNITY): Payer: 59

## 2023-07-18 DIAGNOSIS — M25551 Pain in right hip: Secondary | ICD-10-CM | POA: Diagnosis not present

## 2023-07-18 DIAGNOSIS — I10 Essential (primary) hypertension: Secondary | ICD-10-CM | POA: Diagnosis not present

## 2023-07-18 DIAGNOSIS — G4700 Insomnia, unspecified: Secondary | ICD-10-CM | POA: Insufficient documentation

## 2023-07-18 DIAGNOSIS — Z8546 Personal history of malignant neoplasm of prostate: Secondary | ICD-10-CM | POA: Diagnosis not present

## 2023-07-18 DIAGNOSIS — J449 Chronic obstructive pulmonary disease, unspecified: Secondary | ICD-10-CM | POA: Diagnosis not present

## 2023-07-18 DIAGNOSIS — Z79899 Other long term (current) drug therapy: Secondary | ICD-10-CM | POA: Insufficient documentation

## 2023-07-18 DIAGNOSIS — Z0000 Encounter for general adult medical examination without abnormal findings: Secondary | ICD-10-CM | POA: Insufficient documentation

## 2023-07-18 DIAGNOSIS — R002 Palpitations: Secondary | ICD-10-CM | POA: Diagnosis not present

## 2023-07-18 DIAGNOSIS — W19XXXA Unspecified fall, initial encounter: Secondary | ICD-10-CM | POA: Diagnosis not present

## 2023-07-18 DIAGNOSIS — Z7901 Long term (current) use of anticoagulants: Secondary | ICD-10-CM | POA: Insufficient documentation

## 2023-07-18 DIAGNOSIS — R4182 Altered mental status, unspecified: Secondary | ICD-10-CM | POA: Diagnosis present

## 2023-07-18 DIAGNOSIS — Z7982 Long term (current) use of aspirin: Secondary | ICD-10-CM | POA: Insufficient documentation

## 2023-07-18 LAB — CBC WITH DIFFERENTIAL/PLATELET
Abs Immature Granulocytes: 0.06 10*3/uL (ref 0.00–0.07)
Basophils Absolute: 0.1 10*3/uL (ref 0.0–0.1)
Basophils Relative: 1 %
Eosinophils Absolute: 0.1 10*3/uL (ref 0.0–0.5)
Eosinophils Relative: 1 %
HCT: 41.8 % (ref 39.0–52.0)
Hemoglobin: 13.2 g/dL (ref 13.0–17.0)
Immature Granulocytes: 1 %
Lymphocytes Relative: 21 %
Lymphs Abs: 2.2 10*3/uL (ref 0.7–4.0)
MCH: 27.5 pg (ref 26.0–34.0)
MCHC: 31.6 g/dL (ref 30.0–36.0)
MCV: 87.1 fL (ref 80.0–100.0)
Monocytes Absolute: 0.8 10*3/uL (ref 0.1–1.0)
Monocytes Relative: 8 %
Neutro Abs: 7.2 10*3/uL (ref 1.7–7.7)
Neutrophils Relative %: 68 %
Platelets: 352 10*3/uL (ref 150–400)
RBC: 4.8 MIL/uL (ref 4.22–5.81)
RDW: 14.3 % (ref 11.5–15.5)
WBC: 10.4 10*3/uL (ref 4.0–10.5)
nRBC: 0 % (ref 0.0–0.2)

## 2023-07-18 LAB — CBG MONITORING, ED: Glucose-Capillary: 108 mg/dL — ABNORMAL HIGH (ref 70–99)

## 2023-07-18 LAB — COMPREHENSIVE METABOLIC PANEL
ALT: 15 U/L (ref 0–44)
AST: 24 U/L (ref 15–41)
Albumin: 3.3 g/dL — ABNORMAL LOW (ref 3.5–5.0)
Alkaline Phosphatase: 346 U/L — ABNORMAL HIGH (ref 38–126)
Anion gap: 13 (ref 5–15)
BUN: 5 mg/dL — ABNORMAL LOW (ref 8–23)
CO2: 24 mmol/L (ref 22–32)
Calcium: 9.3 mg/dL (ref 8.9–10.3)
Chloride: 98 mmol/L (ref 98–111)
Creatinine, Ser: 0.72 mg/dL (ref 0.61–1.24)
GFR, Estimated: >60 mL/min (ref >60)
Glucose, Bld: 107 mg/dL — ABNORMAL HIGH (ref 70–99)
Potassium: 3.8 mmol/L (ref 3.5–5.1)
Sodium: 135 mmol/L (ref 135–145)
Total Bilirubin: 0.5 mg/dL (ref 0.3–1.2)
Total Protein: 8.2 g/dL — ABNORMAL HIGH (ref 6.5–8.1)

## 2023-07-18 LAB — URINALYSIS, ROUTINE W REFLEX MICROSCOPIC
Bilirubin Urine: NEGATIVE
Glucose, UA: NEGATIVE mg/dL
Hgb urine dipstick: NEGATIVE
Ketones, ur: 5 mg/dL — AB
Leukocytes,Ua: NEGATIVE
Nitrite: NEGATIVE
Protein, ur: NEGATIVE mg/dL
Specific Gravity, Urine: 1.009 (ref 1.005–1.030)
pH: 5 (ref 5.0–8.0)

## 2023-07-18 LAB — ETHANOL: Alcohol, Ethyl (B): <10 mg/dL (ref <10)

## 2023-07-18 LAB — AMMONIA: Ammonia: 47 umol/L — ABNORMAL HIGH (ref 9–35)

## 2023-07-18 MED ORDER — METOPROLOL TARTRATE 25 MG PO TABS: 50.0000 mg | ORAL_TABLET | Freq: Once | ORAL | Status: DC

## 2023-07-18 MED ORDER — LORAZEPAM 1 MG PO TABS
1.0000 mg | ORAL_TABLET | Freq: Once | ORAL | Status: AC
  Administered 2023-07-18: 1 mg via ORAL
  Filled 2023-07-18: qty 1

## 2023-07-18 MED ORDER — LACTATED RINGERS IV BOLUS
500.0000 mL | Freq: Once | INTRAVENOUS | Status: AC
  Administered 2023-07-18: 500 mL via INTRAVENOUS

## 2023-07-18 MED ORDER — LORAZEPAM 1 MG PO TABS
0.5000 mg | ORAL_TABLET | Freq: Once | ORAL | Status: AC
  Administered 2023-07-18: 0.5 mg via ORAL
  Filled 2023-07-18: qty 1

## 2023-07-18 MED ORDER — METOPROLOL TARTRATE 25 MG PO TABS
50.0000 mg | ORAL_TABLET | Freq: Once | ORAL | Status: AC
  Administered 2023-07-18: 50 mg via ORAL
  Filled 2023-07-18: qty 2

## 2023-07-18 MED ORDER — LACTATED RINGERS IV BOLUS
1000.0000 mL | Freq: Once | INTRAVENOUS | Status: AC
  Administered 2023-07-18: 1000 mL via INTRAVENOUS

## 2023-07-18 NOTE — ED Notes (Signed)
Sitter at bedside, pt calm at this time.

## 2023-07-18 NOTE — ED Provider Notes (Signed)
[place] EMERGENCY DEPARTMENT AT [facility] Provider Note   CSN: [id number] Arrival date & time: 07/18/23  4098     History  Chief Complaint  Patient presents with   Altered Mental Status   Insomnia   Hip Pain   Palpitations   Fall    Brian Dudley is a 62 y.o. 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  Per EMS, patient was sent from SNF w/ multiple complaints: -not sleeping -Facility does not have seizure meds on hand -Tachycardic (though paperwork reports history of the same)  -Returned to facility on Friday after being gone for approx 1 month. Reports a mental status change since being there previously. -Recurrent falls, reported right hip pain?  Per chart review he was admitted from 07/08/23-07/13/23 after presenting with aphasia and R sided jerking. CTH/MRI brain unchanged from prior stroke, likely seizure-like activity. Remained weak w/ deficits in RUE/RLE and aphasic. Neuro stopped valproic acid d/t c/f hyperammonemia and increased keppra to 2g BID.  On my evaluation of the patient, he is awake and alert. He follows some commands. He is profoundly aphasic and I cannot interpret what he is trying to communicate. When asked if he is in pain he states yes but I cannot determine where he is hurting. He is talkative but not understandable which seems consistent with his most recent DC summary.   Called [place], patient's daughter. She states that the nurse told him his pulse was 120 bpm and he was restless. Latest ailment has been seizure activity. Patient's daughter unclear if he has had seizure or not. Last saw the patient yesterday evening, and he was the acting the same. Thinks that the patient can't sleep because he isn't comfortable there and wants to go home. Doctor at facility came to see him and  suggested he go to ED. Daughter states he has had two falls, one yesterday and the night before, both while transferring with PT.    Past Medical History:  Diagnosis Date   BPH (benign prostatic hyperplasia)    COPD with emphysema (HCC)    CVA (cerebral vascular accident) (HCC)    Diverticulitis    Family history of pancreatic cancer    History of alcohol abuse    History of colon cancer    History of tobacco abuse    Hypertension    Prostate cancer (HCC)        Home Medications Prior to Admission medications   Medication Sig Start Date End Date Taking? Authorizing Provider  amLODipine (NORVASC) 5 MG tablet Take 5 mg by mouth at bedtime.   Yes [provider]  aspirin EC 81 MG tablet Take 1 tablet (81 mg total) by mouth daily. Swallow whole. 05/31/23  Yes [place], Alvira Philips, DO  clopidogrel (PLAVIX) 75 MG tablet Take 75 mg by mouth daily.   Yes [provider]  diazePAM, 15 MG Dose, (VALTOCO 15 MG DOSE) 2 x 7.5 MG/0.1ML LQPK Place 15 mg into the nose as directed. Place 15 mg into the nose for seizures lasting longer than 2 minutes. 07/13/23  Yes Champ Mungo, DO  Fluticasone-Umeclidin-Vilant (TRELEGY ELLIPTA) 100-62.5-25 MCG/ACT AEPB Inhale 1 puff into the lungs daily.   Yes [provider]  folic acid (FOLVITE) 1 MG tablet Take 1 tablet (1 mg total) by mouth daily. 03/29/18  Yes Elgergawy, Leana Roe, MD  levETIRAcetam (KEPPRA) 1000 MG tablet Take 2 tablets (2,000 mg total) by mouth 2 (two) times daily. 07/13/23 08/12/23 Yes Annett Fabian, MD  metoprolol tartrate (LOPRESSOR) 50 MG tablet Take 1 tablet (50 mg total) by mouth 2 (two) times daily. 05/30/23  Yes [place], Eric J, DO  montelukast (SINGULAIR) 10 MG tablet Take 10 mg by mouth at bedtime.   Yes [provider]  Multiple Vitamin (MULTIVITAMIN WITH MINERALS) TABS tablet Take 1 tablet by mouth daily. 03/29/18  Yes Elgergawy, Leana Roe, MD  rosuvastatin (CRESTOR) 20 MG tablet Take 20 mg by mouth at  bedtime.   Yes [provider]  tamsulosin (FLOMAX) 0.4 MG CAPS capsule Take 0.4 mg by mouth daily.   Yes [provider]      Allergies    Valproic acid and related    Review of Systems   Review of Systems A 10 point review of systems was performed and is negative unless otherwise reported in HPI.  Physical Exam Updated Vital Signs BP 104/66   Pulse 92   Temp 98.1 F (36.7 C) (Axillary)   Resp 14   Ht 5\' 5"  (1.651 m)   Wt 70 kg   SpO2 96%   BMI 25.68 kg/m  Physical Exam General: Normal appearing male, lying in bed.  HEENT: PERRLA, Sclera anicteric, MMM, trachea midline.  Cardiology: RRR, no murmurs/rubs/gallops. BL radial and DP pulses equal bilaterally.  Resp: Normal respiratory rate and effort. CTAB, no wheezes, rhonchi, crackles.  Abd: Soft, non-tender, non-distended. No rebound tenderness or guarding.  GU: Deferred. MSK: No peripheral edema or signs of trauma. Extremities without deformity or TTP. No cyanosis or clubbing. Skin: warm, dry. No rashes or lesions. Back: No CVA tenderness Neuro: A&Ox4, CNs II-XII grossly intact. MAEs. Sensation grossly intact.  Psych: Normal mood and affect.   ED Results / Procedures / Treatments   Labs (all labs ordered are listed, but only abnormal results are displayed) Labs Reviewed  COMPREHENSIVE METABOLIC PANEL - Abnormal; Notable for the following components:      Result Value   Glucose, Bld 107 (*)    BUN 5 (*)    Total Protein 8.2 (*)    Albumin 3.3 (*)    Alkaline Phosphatase 346 (*)    All other components within normal limits  URINALYSIS, ROUTINE W REFLEX MICROSCOPIC - Abnormal; Notable for the following components:   Ketones, ur 5 (*)    All other components within normal limits  LEVETIRACETAM LEVEL - Abnormal; Notable for the following components:   Levetiracetam Lvl 77.3 (*)    All other components within normal limits  AMMONIA - Abnormal; Notable for the following components:   Ammonia 47 (*)     All other components within normal limits  CBG MONITORING, ED - Abnormal; Notable for the following components:   Glucose-Capillary 108 (*)    All other components within normal limits  CBC WITH DIFFERENTIAL/PLATELET  ETHANOL    EKG EKG Interpretation Date/Time:  Tuesday July 18 2023 11:13:23 EDT Ventricular Rate:  117 PR Interval:  184 QRS Duration:  85 QT Interval:  312 QTC Calculation: 436 R Axis:   102  Text Interpretation: Sinus tachycardia Probable left atrial enlargement Right axis deviation Confirmed by Vivi Barrack [phone]) on 07/18/2023 11:18:11 AM  Radiology CTH: No acute intracranial abnormality   Femur R XR No acute osseous abnormality.   PXR No acute osseous abnormality.   CXR Persistent diffuse interstitial lung changes. Acute versus chronic. Recommend follow up or  dedicated workup when appropriate  Procedures Procedures    Medications Ordered in ED Medications  lactated ringers bolus 500 mL (0 mLs Intravenous Stopped 07/18/23 1352)  lactated ringers bolus 500 mL (0 mLs Intravenous Stopped 07/18/23 1425)  LORazepam (ATIVAN) tablet 1 mg (1 mg Oral Given 07/18/23 1445)  lactated ringers bolus 1,000 mL (0 mLs Intravenous Stopped 07/18/23 1705)  metoprolol tartrate (LOPRESSOR) tablet 50 mg (50 mg Oral Given 07/18/23 1724)  LORazepam (ATIVAN) tablet 0.5 mg (0.5 mg Oral Given 07/18/23 1724)    ED Course/ Medical Decision Making/ A&P                          Medical Decision Making Amount and/or Complexity of Data Reviewed Labs: ordered. Decision-making details documented in ED Course. Radiology: ordered.  Risk Prescription drug management.    This patient presents to the ED for concern of reported AMS, recurrent falls, R hip pain; this involves an extensive number of treatment options, and is a complaint that carries with it a high risk of complications and morbidity.  I considered the following differential and admission for this acute,  potentially life threatening condition.   MDM:    Initially reported by facility that patient was altered, however on further discussion with daughter he appears to be c/w his new baseline. He is noted to be tachycardic, which most likely is due to hypovolemia/dehydration in s/o dementia/h/o CVA, however will give fluids and reassess. He was also reported to have fallen, I can find no signs of trauma but he had reported hip pain at facility reportedly so obtained XRs of chest, pelvis, R femur which were reassuring and negative. Also obtained CTH which was negative for ICH/skull fracture/hydrocephalus. He had no hypo/hyperglycemia, electrolyte derangements, UTI, PNA. On most recent admission to hospital had mildly elevated ammonia thought to be due to valproic acid which I will retest here. Will also obtain keppra level as unclear what doses he has received from facility.   Clinical Course as of 07/22/23 1203  Tue Jul 18, 2023  1109 CTH, PXR, CXR, femur XR without acute changes.  [HN]  1354 CBC, CMP unremarkable. EtOH neg [HN]  1537 Ammonia has hemolyzed x2 in the lab. Patient then pulled out his IV. HR still in 120s despite 1L IVF. Will give another L IVF but will need to regain IV access. Given 1 mg PO ativan to assist in helping patient calm down. [HN]  1538 Urinalysis, Routine w reflex microscopic -Urine, Clean Catch(!) No UTI [HN]  1602 IV placed, receiving 2nd L IVF, ammonia in process [HN]    Clinical Course User Index [HN] Loetta Rough, MD    Labs: I Ordered, and personally interpreted labs.  The pertinent results include:  those listed above  Imaging Studies ordered: I ordered imaging studies including those listed above I independently visualized and interpreted imaging. I agree with the radiologist interpretation  Additional history obtained from chart review, EMS, daughter.    Cardiac Monitoring: The patient was maintained on a cardiac monitor.  I personally viewed and  interpreted the cardiac monitored which showed an underlying rhythm of: sinus tachycardia  Reevaluation: After the interventions noted above, I reevaluated the patient and found that they have :improved  Social Determinants of Health: Lives at facility  Disposition:  D/w daughter who is in agreement with patient going back to facility once his workup here is complete. I discussed with her his results thus far. Patient is  signed out to the oncoming ED physician Dr. Wilkie Aye who is made aware of her history, presentation, exam, workup, and plan.    Co morbidities that complicate the patient evaluation  Past Medical History:  Diagnosis Date   BPH (benign prostatic hyperplasia)    COPD with emphysema (HCC)    CVA (cerebral vascular accident) (HCC)    Diverticulitis    Family history of pancreatic cancer    History of alcohol abuse    History of colon cancer    History of tobacco abuse    Hypertension    Prostate cancer (HCC)      Medicines Meds ordered this encounter  Medications   lactated ringers bolus 500 mL   lactated ringers bolus 500 mL   LORazepam (ATIVAN) tablet 1 mg   lactated ringers bolus 1,000 mL   metoprolol tartrate (LOPRESSOR) tablet 50 mg   LORazepam (ATIVAN) tablet 0.5 mg   DISCONTD: metoprolol tartrate (LOPRESSOR) tablet 50 mg    I have reviewed the patients home medicines and have made adjustments as needed  Problem List / ED Course: Problem List Items Addressed This Visit   None Visit Diagnoses     General medical exam    -  Primary                   This note was created using dictation software, which may contain spelling or grammatical errors.    Loetta Rough, MD 07/22/23 [phone]

## 2023-07-18 NOTE — ED Notes (Signed)
Report to [facility] at [facility] nursing and rehab

## 2023-07-18 NOTE — Discharge Instructions (Addendum)
You have been seen and discharged from the emergency department.  Your blood work and evaluation was baseline for you.  Follow-up with your primary provider for further evaluation and further care. Take home medications as prescribed. If you have any worsening symptoms or further concerns for your health please return to an emergency department for further evaluation.

## 2023-07-18 NOTE — ED Notes (Signed)
Pt will return to [facility]

## 2023-07-18 NOTE — ED Notes (Signed)
Ammonia/dk green on ice sent

## 2023-07-18 NOTE — ED Triage Notes (Signed)
EMS stated, Pt comes from Nursing home. Pt has been at home and has returned to the facility and stated his mental status has changed since a month ago. He is not sleeping and I think he has fallen, not exactly know when. Pt complains of right hip pain.

## 2023-07-18 NOTE — ED Notes (Signed)
Phlebotomy at bedside trying to obtain ammonia.. four samples have hemolyzed

## 2023-07-18 NOTE — ED Notes (Signed)
Phlebotomy attempted to collect ammonia and was unable to obtain.

## 2023-07-18 NOTE — ED Notes (Signed)
Pt was stuck twice. Wasn't successful.

## 2023-07-18 NOTE — ED Notes (Signed)
Managed to obtain small blood sample for ammonia. Personally walked to lab and handed to tech. Tech will release result even if hemolysed.

## 2023-07-18 NOTE — ED Notes (Signed)
Pt will not leave monitor leads on. He ripped them all off. He is constantly trying to get out of bed and having to be re-directed.

## 2023-07-18 NOTE — ED Provider Notes (Signed)
Patient signed out to me by previous provider. Please refer to their note for full HPI.  Briefly this is a 62 year old male who presents from facility with multiple generalized concerns reported from facility staff.  At my time of signout patient was pending ammonia and evaluation of tachycardia which was sinus.  Review of the patient's med list shows that the patient is post to be on metoprolol twice a day.  It appears he was not given his morning dose today.  I believe the tachycardia is most likely rebound from this.  After an oral dose the tachycardia has resolved.  Repeat ammonia shows that it is slightly lower at 47 when compared to outpatient blood work.  I do not believe that this is elevated enough or acute in regards to any change in mental status.  I called to the daughter as well and she states that this has been the patient's new normal.  She is happy for her to be discharged back to the facility and she will continue his care from there.  Patient at this time appears safe and stable for discharge and close outpatient follow up. Discharge plan and strict return to ED precautions discussed, patient verbalizes understanding and agreement.   Rozelle Logan, DO 07/18/23 2207

## 2023-07-18 NOTE — ED Notes (Signed)
Pt just ripped his IV out and waved at this paramedic at the door

## 2023-07-18 NOTE — ED Notes (Signed)
PT is screaming that he needs to go to [facility] and pointing to the side of the room that it is there. Pt also trying to climb out of bed. He is easily re-directed but happens again just a minute later. Pt also ripping off pulse ox monitor.

## 2023-07-19 ENCOUNTER — Telehealth: Payer: Self-pay | Admitting: Genetic Counselor

## 2023-07-19 LAB — LEVETIRACETAM LEVEL: Levetiracetam Lvl: 77.3 ug/mL — ABNORMAL HIGH (ref 10.0–40.0)

## 2023-07-19 NOTE — Telephone Encounter (Signed)
Spoke with patient's daughter, [name].  Revealed negative genetic testing.  Discussed that we do not know why he has prostate cancer or why there is cancer in the family. It could be due to a different gene that we are not testing, or maybe our current technology may not be able to pick something up.  It will be important for him to keep in contact with genetics to keep up with whether additional testing may be needed.   VUS identified. This will not change medical management.

## 2023-07-20 ENCOUNTER — Ambulatory Visit: Payer: Self-pay | Admitting: Genetic Counselor

## 2023-07-20 DIAGNOSIS — Z1379 Encounter for other screening for genetic and chromosomal anomalies: Secondary | ICD-10-CM

## 2023-07-20 NOTE — Progress Notes (Signed)
HPI:  [name] was previously seen in the [facility] due to a personal and family history of cancer and concerns regarding a hereditary predisposition to cancer. Please refer to our prior cancer genetics clinic note for more information regarding our discussion, assessment and recommendations, at the time. [name] recent genetic test results were disclosed to him, as were recommendations warranted by these results. These results and recommendations are discussed in more detail below.  CANCER HISTORY:  Oncology History Overview Note  Initial diagnosis: 04/15/2020 PSA 150.24  05/2020 Abdominal and pelvic lymphadenopathy present at diagnosis. 05/28/20 Biopsy of the prostate demonstrated 8 of 8 cores positive with a Gleason score of 4+5= 9.  Gleason score 9  07/16/2020 starteed Lupron every 3 months   11/2020 Pylarify PET CT scattered hypermetabolic lymph nodes in the axilla retroperitoneal lymph nodes, and pelvic lymph nodes have resolved. New small hypermetabolic paratracheal esophageal lymph node 7 mm to be monitored.   09/2020 PSA 0.96 11/24/20 5500 cGy to the prostate completed January 05, 2021   12/09/2020 PSA 0.80 03/25/2021 PSA 0.28  01/31/2022 PSA 8.49   03/2022 PET showed evidence of early skeletal metastatic disease identified with lesions in the L3 vertebral body and the spinous process of the left scapula. Left perirectal, right obturator, and right retrocrural lymph nodes demonstrating increased tracer avidity, suspected to represent metastatic disease   04/18/2022 PSA 78.27  04/21/22 Xtandi 160 mg started. 07/22/2022 PSA 0.7 10/17/2022. PSA 1.44 02/2023 PSA 13.43 03/16/23 PSA 27.42 04/07/23 FDG PET 1.  Progressive hypermetabolic lymphadenopathy within the neck and chest as described above.  Neck maximum SUV value of 25.8.  1 cm left paratracheal lymph node demonstrating maximum SUV value of 45.0  Anterior mediastinal lymph node measuring 2.1 cm in  diameter demonstrating maximum SUV value of 66.7 international units appears progressive compared to previous study (previously measured 1 cm in diameter with a maximum SUV value of 39.2 international units  ABDOMEN/PELVIS:Hypermetabolic right obturator lymph node measures 1.2 cm and demonstrates a maximum SUV value of 10.2 international units (unchanged in size with previous maximum SUV value 8.3 international units).  No discrete evidence of tracer avid disease within the prostate gland.   2.  Progressive hypermetabolic osseous metastatic disease.  Previous index hypermetabolic focus involving the L3 vertebral body probably demonstrates maximum SUV value of 77.6 international units (previous maximum SUV value 42.4 international units).  Focus of uptake involving the T4 vertebral body with maximum SUV value of 83.2 international units is noted (previous maximum SUV value 86.3 international units).  Previous focus of FDG uptake involving the odontoid process probably demonstrates a maximum SUV value of 113 international units (previous maximum SUV value of 55.3 international units)  New hypermetabolic focus involving the right aspect of the frontal bone is identified with a maximum SUV value of 15.5 international units.  New focus of FDG uptake within the C6 vertebral body with maximum SUV value of 71.8 international units   04/2023 NGS   04/12/23 PSA 35.38  04/21/23 last visit at [facility].    Prostate cancer (HCC)  05/24/2023 Initial Diagnosis   Prostate cancer (HCC)   06/22/2023 Cancer Staging   Staging form: Prostate, AJCC 8th Edition - Clinical stage from 06/22/2023: Stage IVB (rcTX, cN1, cM1c, PSA: 150, Grade Group: 5) - Signed by Melven Sartorius, MD on 06/22/2023 Stage prefix: Recurrence Prostate specific antigen (PSA) range: 20 or greater Histologic grading system: 5 grade system   Rectal cancer (HCC)  07/09/2023  Initial Diagnosis   Rectal cancer (HCC)   07/09/2023 Genetic Testing    Negative genetic testing on the CancerNext-Expanded+RNAinsight.  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  The report date is July 10, 2023.  The CancerNext-Expanded gene panel offered by [facility] and includes sequencing and rearrangement analysis for the following 77 genes: AIP, ALK, APC*, ATM*, AXIN2, BAP1, BARD1, BMPR1A, BRCA1*, BRCA2*, BRIP1*, CDC73, CDH1*, CDK4, CDKN1B, CDKN2A, CHEK2*, CTNNA1, DICER1, FH, FLCN, KIF1B, LZTR1, MAX, MEN1, MET, MLH1*, MSH2*, MSH3, MSH6*, MUTYH*, NF1*, NF2, NTHL1, PALB2*, PHOX2B, PMS2*, POT1, PRKAR1A, PTCH1, PTEN*, RAD51C*, RAD51D*, RB1, RET, SDHA, SDHAF2, SDHB, SDHC, SDHD, SMAD4, SMARCA4, SMARCB1, SMARCE1, STK11, SUFU, TMEM127, TP53*, TSC1, TSC2, and VHL (sequencing and deletion/duplication); EGFR, EGLN1, HOXB13, KIT, MITF, PDGFRA, POLD1, and POLE (sequencing only); EPCAM and GREM1 (deletion/duplication only). DNA and RNA analyses performed for * genes.      FAMILY HISTORY:  We obtained a detailed, 4-generation family history.  Significant diagnoses are listed below: Family History  Problem Relation Age of Onset   Dementia Mother    Stroke Father    Cancer Sister        NOS   Lupus Sister    Lupus Brother    Cancer Maternal Aunt        NOS   Pancreatic cancer Niece 69       The patient has a son and daughter who are cancer free.  He has four brothers and four sisters.  One sister had cancer and one brother has a daughter who had pancreatic cancer.  Both parents are deceased.   There are no additional reports of known specific cancers in the family.   [name] is unaware of previous family history of genetic testing for hereditary cancer risks. There is no reported Ashkenazi Jewish ancestry. There is no known consanguinity  GENETIC TEST RESULTS: Genetic testing reported out on July 10, 2023 through the CancerNext-Expanded+RNAinsight cancer panel found no pathogenic mutations. The CancerNext-Expanded gene panel offered by [facility] and includes sequencing and rearrangement analysis for the following 77 genes: AIP, ALK, APC*, ATM*, AXIN2, BAP1, BARD1, BMPR1A, BRCA1*, BRCA2*, BRIP1*, CDC73, CDH1*, CDK4, CDKN1B, CDKN2A, CHEK2*, CTNNA1, DICER1, FH, FLCN, KIF1B, LZTR1, MAX, MEN1, MET, MLH1*, MSH2*, MSH3, MSH6*, MUTYH*, NF1*, NF2, NTHL1, PALB2*, PHOX2B, PMS2*, POT1, PRKAR1A, PTCH1, PTEN*, RAD51C*, RAD51D*, RB1, RET, SDHA, SDHAF2, SDHB, SDHC, SDHD, SMAD4, SMARCA4, SMARCB1, SMARCE1, STK11, SUFU, TMEM127, TP53*, TSC1, TSC2, and VHL (sequencing and deletion/duplication); EGFR, EGLN1, HOXB13, KIT, MITF, PDGFRA, POLD1, and POLE (sequencing only); EPCAM and GREM1 (deletion/duplication only). DNA and RNA analyses performed for * genes. The test report has been scanned into EPIC and is located under the Molecular Pathology section of the Results Review tab.  A portion of the result report is included below for reference.     We discussed with [name] that because current genetic testing is not perfect, it is possible there may be a gene mutation in one of these genes that current testing cannot detect, but that chance is small.  We also discussed, that there could be another gene that has not yet been discovered, or that we have not yet tested, that is responsible for the cancer diagnoses in the family. It is also possible there is a hereditary cause for the cancer in the family that [name] did not inherit and therefore was not identified in his testing.  Therefore, it is important to remain in touch with cancer genetics in the future so that we can continue to offer  [name] the most up to date genetic testing.   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  At this time, it is unknown if this variant is associated with increased cancer risk or if this is a normal finding, but most variants such as this get reclassified to being  inconsequential. It should not be used to make medical management decisions. With time, we suspect the lab will determine the significance of this variant, if any. If we do learn more about it, we will try to contact [name] to discuss it further. However, it is important to stay in touch with [place] periodically and keep the address and phone number up to date.  ADDITIONAL GENETIC TESTING: We discussed with [name] that his genetic testing was fairly extensive.  If there are genes identified to increase cancer risk that can be analyzed in the future, we would be happy to discuss and coordinate this testing at that time.    CANCER SCREENING RECOMMENDATIONS: [name] test result is considered negative (normal).  This means that we have not identified a hereditary cause for his personal and family history of cancer at this time. Most cancers happen by chance and this negative test suggests that his personal and family history of cancer may fall into this category.    Possible reasons for [name] negative genetic test include:  1. There may be a gene mutation in one of these genes that current testing methods cannot detect but that chance is small.  2. There could be another gene that has not yet been discovered, or that we have not yet tested, that is responsible for the cancer diagnoses in the family.  3.  There may be no hereditary risk for cancer in the family. The cancers in [name] and/or his family may be sporadic/familial or due to other genetic and environmental factors. 4. It is also possible there is a hereditary cause for the cancer in the family that [name] did not inherit.  Therefore, it is recommended he continue to follow the cancer management and screening guidelines provided by his oncology and primary healthcare provider. An individual's cancer risk and medical management are not determined by genetic test results alone. Overall cancer risk assessment  incorporates additional factors, including personal medical history, family history, and any available genetic information that may result in a personalized plan for cancer prevention and surveillance  RECOMMENDATIONS FOR FAMILY MEMBERS:  Individuals in this family might be at some increased risk of developing cancer, over the general population risk, simply due to the family history of cancer.  We recommended women in this family have a yearly mammogram beginning at age 46, or 7 years younger than the earliest onset of cancer, an annual clinical breast exam, and perform monthly breast self-exams. Women in this family should also have a gynecological exam as recommended by their primary provider. All family members should be referred for colonoscopy starting at age 63.  FOLLOW-UP: Lastly, we discussed with [name] that cancer genetics is a rapidly advancing field and it is possible that new genetic tests will be appropriate for him and/or his family members in the future. We encouraged him to remain in contact with cancer genetics on an annual basis so we can update his personal and family histories and let him know of advances in cancer genetics that may benefit this family.   Our contact number was provided. Mr.  Brian Dudley's questions were answered to his satisfaction, and he knows he is welcome to call us at anytime with additional questions or concerns.   Brian Cos, MS, [facility] Licensed, Certified Genetic Counselor [email or web address]

## 2023-07-27 ENCOUNTER — Inpatient Hospital Stay: Payer: 59

## 2023-07-27 ENCOUNTER — Encounter (HOSPITAL_COMMUNITY)
Admission: RE | Admit: 2023-07-27 | Discharge: 2023-07-27 | Disposition: A | Discharge status: Home / Self Care | Payer: 59 | Source: Ambulatory Visit

## 2023-07-27 DIAGNOSIS — C61 Malignant neoplasm of prostate: Secondary | ICD-10-CM | POA: Insufficient documentation

## 2023-07-27 DIAGNOSIS — R296 Repeated falls: Secondary | ICD-10-CM | POA: Insufficient documentation

## 2023-07-27 DIAGNOSIS — Z8673 Personal history of transient ischemic attack (TIA), and cerebral infarction without residual deficits: Secondary | ICD-10-CM | POA: Insufficient documentation

## 2023-07-27 DIAGNOSIS — C7951 Secondary malignant neoplasm of bone: Secondary | ICD-10-CM | POA: Insufficient documentation

## 2023-07-27 DIAGNOSIS — D649 Anemia, unspecified: Secondary | ICD-10-CM

## 2023-07-27 DIAGNOSIS — C19 Malignant neoplasm of rectosigmoid junction: Secondary | ICD-10-CM | POA: Insufficient documentation

## 2023-07-27 DIAGNOSIS — Z79899 Other long term (current) drug therapy: Secondary | ICD-10-CM | POA: Insufficient documentation

## 2023-07-27 LAB — CMP (CANCER CENTER ONLY)
ALT: 5 U/L (ref 0–44)
AST: 14 U/L — ABNORMAL LOW (ref 15–41)
Albumin: 3.8 g/dL (ref 3.5–5.0)
Alkaline Phosphatase: 478 U/L — ABNORMAL HIGH (ref 38–126)
Anion gap: 9 (ref 5–15)
BUN: 5 mg/dL — ABNORMAL LOW (ref 8–23)
CO2: 27 mmol/L (ref 22–32)
Calcium: 9.8 mg/dL (ref 8.9–10.3)
Chloride: 100 mmol/L (ref 98–111)
Creatinine: 0.55 mg/dL — ABNORMAL LOW (ref 0.61–1.24)
GFR, Estimated: >60 mL/min (ref >60)
Glucose, Bld: 106 mg/dL — ABNORMAL HIGH (ref 70–99)
Potassium: 3.6 mmol/L (ref 3.5–5.1)
Sodium: 136 mmol/L (ref 135–145)
Total Bilirubin: 0.4 mg/dL (ref 0.3–1.2)
Total Protein: 8.2 g/dL — ABNORMAL HIGH (ref 6.5–8.1)

## 2023-07-27 LAB — CBC WITH DIFFERENTIAL (CANCER CENTER ONLY)
Abs Immature Granulocytes: 0.06 10*3/uL (ref 0.00–0.07)
Basophils Absolute: 0.1 10*3/uL (ref 0.0–0.1)
Basophils Relative: 1 %
Eosinophils Absolute: 0.1 10*3/uL (ref 0.0–0.5)
Eosinophils Relative: 1 %
HCT: 36.4 % — ABNORMAL LOW (ref 39.0–52.0)
Hemoglobin: 12.2 g/dL — ABNORMAL LOW (ref 13.0–17.0)
Immature Granulocytes: 1 %
Lymphocytes Relative: 19 %
Lymphs Abs: 2 10*3/uL (ref 0.7–4.0)
MCH: 27.5 pg (ref 26.0–34.0)
MCHC: 33.5 g/dL (ref 30.0–36.0)
MCV: 82.2 fL (ref 80.0–100.0)
Monocytes Absolute: 0.6 10*3/uL (ref 0.1–1.0)
Monocytes Relative: 6 %
Neutro Abs: 7.5 10*3/uL (ref 1.7–7.7)
Neutrophils Relative %: 72 %
Platelet Count: 319 10*3/uL (ref 150–400)
RBC: 4.43 MIL/uL (ref 4.22–5.81)
RDW: 14.3 % (ref 11.5–15.5)
WBC Count: 10.3 10*3/uL (ref 4.0–10.5)
nRBC: 0 % (ref 0.0–0.2)

## 2023-07-27 LAB — FERRITIN: Ferritin: 480 ng/mL — ABNORMAL HIGH (ref 24–336)

## 2023-07-27 LAB — LACTATE DEHYDROGENASE: LDH: 204 U/L — ABNORMAL HIGH (ref 98–192)

## 2023-07-27 MED ORDER — FLOTUFOLASTAT F 18 GALLIUM 296-5846 MBQ/ML IV SOLN
7.7700 | Freq: Once | INTRAVENOUS | Status: AC
  Administered 2023-07-27: 7.77 via INTRAVENOUS
  Filled 2023-07-27: qty 8

## 2023-07-28 ENCOUNTER — Telehealth: Payer: Self-pay

## 2023-07-28 LAB — TESTOSTERONE: Testosterone: 68 ng/dL — ABNORMAL LOW (ref 264–916)

## 2023-07-28 LAB — PSA, TOTAL AND FREE
PSA, Free Pct: >7.3 %
PSA, Free: >50 ng/mL
Prostate Specific Ag, Serum: 685 ng/mL — ABNORMAL HIGH (ref 0.0–4.0)

## 2023-07-28 NOTE — Telephone Encounter (Signed)
Patient has stated due to work schedule they are unable to schedule an appointment at this time

## 2023-07-29 ENCOUNTER — Telehealth: Payer: Self-pay

## 2023-08-01 NOTE — Assessment & Plan Note (Deleted)
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 PSA 35 in 04/2023 06/2023 first visit to [facility] oncology 07/2023 PSA 685. PSMA PET showed progression with osseous metastases, lymph node metastases.   No actionable target from [employer] one.

## 2023-08-01 NOTE — Progress Notes (Deleted)
Patient Care Team: Jerrell Belfast, MD as PCP - General (Internal Medicine) [name], RN as Oncology Nurse Navigator  Clinic Day:  08/03/2023  Referring physician: Jerrell Belfast, MD  ASSESSMENT & PLAN:   Assessment & Plan:  62 y.o.m with history of CVA, recurrent seizure, locally advanced rectal adenocarcinoma, mCRPC post ADT/ARPI here for follow-up after PSMA PET/CT.  PSA rising quickly.  CEA is not elevated with h/o high risk rectal cancer.   Restaging imaging showed disease progression, and rising PSA.  We discussed potential treatment options today.  He has progressed on ADT, with ARPI.  There are several options available.  With his extensiveness of disease.  Other relevant history including seizure.   Prostate cancer [facility]) Current diagnosis: mCRPC Initial diagnosis: mHSPC 05/2020 Gleason score 9 Abdominal and pelvic lymphadenopathy Treatment: 07/16/2020 starteed Lupron 12/2020 completed 5500 cGy to the prostate Progression in 2023 04/21/22 Xtandi with ADT Progression in 2024. PSA 35 in 04/2023 06/2023 first visit to [facility] oncology 07/2023 PSA 685. PSMA PET showed progression with osseous metastases, lymph node metastases.   No actionable target from [employer] one.     The patient understands the plans discussed today and is in agreement with them.  He knows to contact our office if he develops concerns prior to his next appointment.  I provided *** minutes of face-to-face time during this encounter and > 50% was spent counseling as documented under my assessment and plan.    Melven Sartorius, MD  [facility] [address] [place] [address] Dept: [phone] Dept Fax: [phone]   No orders of the defined types were placed in this encounter.     CHIEF COMPLAINT:  CC: ***  Current Treatment:  ***  INTERVAL HISTORY:  Jonathin is here today for repeat  clinical assessment. He denies fevers or chills. He denies pain. His appetite is good. His weight {Weight change:10426}.  I have reviewed the past medical history, past surgical history, social history and family history with the patient and they are unchanged from previous note.  ALLERGIES:  is allergic to valproic acid and related.  MEDICATIONS:  Current Outpatient Medications  Medication Sig Dispense Refill   amLODipine (NORVASC) 5 MG tablet Take 5 mg by mouth at bedtime.     aspirin EC 81 MG tablet Take 1 tablet (81 mg total) by mouth daily. Swallow whole.     clopidogrel (PLAVIX) 75 MG tablet Take 75 mg by mouth daily.     diazePAM, 15 MG Dose, (VALTOCO 15 MG DOSE) 2 x 7.5 MG/0.1ML LQPK Place 15 mg into the nose as directed. Place 15 mg into the nose for seizures lasting longer than 2 minutes. 2 each 0   Fluticasone-Umeclidin-Vilant (TRELEGY ELLIPTA) 100-62.5-25 MCG/ACT AEPB Inhale 1 puff into the lungs daily.     folic acid (FOLVITE) 1 MG tablet Take 1 tablet (1 mg total) by mouth daily. 30 tablet 0   levETIRAcetam (KEPPRA) 1000 MG tablet Take 2 tablets (2,000 mg total) by mouth 2 (two) times daily. 120 tablet 0   metoprolol tartrate (LOPRESSOR) 50 MG tablet Take 1 tablet (50 mg total) by mouth 2 (two) times daily.     montelukast (SINGULAIR) 10 MG tablet Take 10 mg by mouth at bedtime.     Multiple Vitamin (MULTIVITAMIN WITH MINERALS) TABS tablet Take 1 tablet by mouth daily. 30 tablet 0   rosuvastatin (CRESTOR) 20 MG tablet  Take 20 mg by mouth at bedtime.     tamsulosin (FLOMAX) 0.4 MG CAPS capsule Take 0.4 mg by mouth daily.     No current facility-administered medications for this visit.    HISTORY OF PRESENT ILLNESS:   Oncology History Overview Note  Initial diagnosis: 04/15/2020 PSA 150.24  05/2020 Abdominal and pelvic lymphadenopathy present at diagnosis. 05/28/20 Biopsy of the prostate demonstrated 8 of 8 cores positive with a Gleason score of 4+5= 9.  Gleason score 9   07/16/2020 starteed Lupron every 3 months   11/2020 Pylarify PET CT scattered hypermetabolic lymph nodes in the axilla retroperitoneal lymph nodes, and pelvic lymph nodes have resolved. New small hypermetabolic paratracheal esophageal lymph node 7 mm to be monitored.   09/2020 PSA 0.96 11/24/20 5500 cGy to the prostate completed January 05, 2021   12/09/2020 PSA 0.80 03/25/2021 PSA 0.28  01/31/2022 PSA 8.49   03/2022 PET showed evidence of early skeletal metastatic disease identified with lesions in the L3 vertebral body and the spinous process of the left scapula. Left perirectal, right obturator, and right retrocrural lymph nodes demonstrating increased tracer avidity, suspected to represent metastatic disease   04/18/2022 PSA 78.27  04/21/22 Xtandi 160 mg started. 07/22/2022 PSA 0.7 10/17/2022. PSA 1.44 02/2023 PSA 13.43 03/16/23 PSA 27.42 04/07/23 FDG PET 1.  Progressive hypermetabolic lymphadenopathy within the neck and chest as described above.  Neck maximum SUV value of 25.8.  1 cm left paratracheal lymph node demonstrating maximum SUV value of 45.0  Anterior mediastinal lymph node measuring 2.1 cm in diameter demonstrating maximum SUV value of 66.7 international units appears progressive compared to previous study (previously measured 1 cm in diameter with a maximum SUV value of 39.2 international units  ABDOMEN/PELVIS:Hypermetabolic right obturator lymph node measures 1.2 cm and demonstrates a maximum SUV value of 10.2 international units (unchanged in size with previous maximum SUV value 8.3 international units).  No discrete evidence of tracer avid disease within the prostate gland.   2.  Progressive hypermetabolic osseous metastatic disease.  Previous index hypermetabolic focus involving the L3 vertebral body probably demonstrates maximum SUV value of 77.6 international units (previous maximum SUV value 42.4 international units).  Focus of uptake involving the T4 vertebral body with  maximum SUV value of 83.2 international units is noted (previous maximum SUV value 86.3 international units).  Previous focus of FDG uptake involving the odontoid process probably demonstrates a maximum SUV value of 113 international units (previous maximum SUV value of 55.3 international units)  New hypermetabolic focus involving the right aspect of the frontal bone is identified with a maximum SUV value of 15.5 international units.  New focus of FDG uptake within the C6 vertebral body with maximum SUV value of 71.8 international units   04/2023 NGS   04/12/23 PSA 35.38  04/21/23 last visit at [facility].  06/22/23 first visit to [facility] health medical oncology.  Recommend proceed with PET scanning restaging, referral for genetic testing, somatic testing.  Unfortunately, patient was admitted later in the month twice for altered mental status, seizure.  MRI was negative for metastases. EEG reported evidence of epileptogenicity and cortical dysfunction arising from left hemisphere, maximal left posterior quadrant likely secondary to underlying stroke.   [employer]: BRCA1 VUS p.[id number]  Foundation one: KRAS K117N. DNMT3A K744. TP53 rearrangement intron 1.  TMB 10 muts/mb. MSI high-not detected.   07/27/23 PSA 685.  Total testosterone 68. PSMA PET 1. Widespread radiotracer avid sclerotic and mixed lesions throughout the axial and proximal appendicular skeleton,  consistent with widespread osseous prostate metastatic disease. 2. Radiotracer avid low cervical, mediastinal, and retroperitoneal abdominopelvic lymph nodes, consistent with prostate nodal metastatic disease. 3. No focal activity in the prostate bed. 4.  Aortic Atherosclerosis (ICD10-I70.0).   Prostate cancer (HCC)  05/24/2023 Initial Diagnosis   Prostate cancer (HCC)   06/22/2023 Cancer Staging   Staging form: Prostate, AJCC 8th Edition - Clinical stage from 06/22/2023: Stage IVB (rcTX, cN1, cM1c, PSA: 150, Grade Group: 5) - Signed by Melven Sartorius, MD on 06/22/2023 Stage prefix: Recurrence Prostate specific antigen (PSA) range: 20 or greater Histologic grading system: 5 grade system   Rectal cancer (HCC)  07/09/2023 Initial Diagnosis   Rectal cancer (HCC)   07/09/2023 Genetic Testing   Negative genetic testing on the CancerNext-Expanded+RNAinsight.  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  The report date is July 10, 2023.  The CancerNext-Expanded gene panel offered by [facility] and includes sequencing and rearrangement analysis for the following 77 genes: AIP, ALK, APC*, ATM*, AXIN2, BAP1, BARD1, BMPR1A, BRCA1*, BRCA2*, BRIP1*, CDC73, CDH1*, CDK4, CDKN1B, CDKN2A, CHEK2*, CTNNA1, DICER1, FH, FLCN, KIF1B, LZTR1, MAX, MEN1, MET, MLH1*, MSH2*, MSH3, MSH6*, MUTYH*, NF1*, NF2, NTHL1, PALB2*, PHOX2B, PMS2*, POT1, PRKAR1A, PTCH1, PTEN*, RAD51C*, RAD51D*, RB1, RET, SDHA, SDHAF2, SDHB, SDHC, SDHD, SMAD4, SMARCA4, SMARCB1, SMARCE1, STK11, SUFU, TMEM127, TP53*, TSC1, TSC2, and VHL (sequencing and deletion/duplication); EGFR, EGLN1, HOXB13, KIT, MITF, PDGFRA, POLD1, and POLE (sequencing only); EPCAM and GREM1 (deletion/duplication only). DNA and RNA analyses performed for * genes.        REVIEW OF SYSTEMS:   Constitutional: Denies fevers, chills or abnormal weight loss Eyes: Denies blurriness of vision Ears, nose, mouth, throat, and face: Denies mucositis or sore throat Respiratory: Denies cough, dyspnea or wheezes Cardiovascular: Denies palpitation, chest discomfort or lower extremity swelling Gastrointestinal:  Denies nausea, heartburn or change in bowel habits Skin: Denies abnormal skin rashes Lymphatics: Denies new lymphadenopathy or easy bruising Neurological:Denies numbness, tingling or new weaknesses Behavioral/Psych: Mood is stable, no new changes  All other systems were reviewed with the patient and are negative.   VITALS:  There were no vitals taken for this visit.  Wt Readings from Last 3 Encounters:   07/18/23 154 lb 5.2 oz (70 kg)  07/08/23 153 lb (69.4 kg)  06/22/23 137 lb 12.8 oz (62.5 kg)    There is no height or weight on file to calculate BMI.  Performance status (ECOG): {CHL ONC [id number]  PHYSICAL EXAM:   GENERAL:alert, no distress and comfortable SKIN: skin color normal, no rashes  EYES: normal, sclera clear OROPHARYNX: no exudate, no erythema    NECK: supple,  non-tender, without nodularity LYMPH:  no palpable cervical lymphadenopathy LUNGS: clear to auscultation with normal breathing effort.  No wheeze or rales HEART: regular rate & rhythm and no murmurs and no lower extremity edema ABDOMEN: abdomen soft, non-tender and nondistended Musculoskeletal: no edema NEURO: alert, fluent speech, no focal motor/sensory deficits.  Strength and sensation equal bilaterally.  LABORATORY DATA:  I have reviewed the data as listed    Component Value Date/Time   NA 136 07/27/2023 1415   K 3.6 07/27/2023 1415   CL 100 07/27/2023 1415   CO2 27 07/27/2023 1415   GLUCOSE 106 (H) 07/27/2023 1415   BUN 5 (L) 07/27/2023 1415   CREATININE 0.55 (L) 07/27/2023 1415   CALCIUM 9.8 07/27/2023 1415   PROT 8.2 (H) 07/27/2023 1415   ALBUMIN 3.8 07/27/2023 1415   AST 14 (L) 07/27/2023 1415   ALT 5 07/27/2023  1415   ALKPHOS 478 (H) 07/27/2023 1415   BILITOT 0.4 07/27/2023 1415   GFRNONAA >60 07/27/2023 1415   GFRAA >60 03/28/2018 0622    No results found for: "SPEP", "UPEP"  Lab Results  Component Value Date   WBC 10.3 07/27/2023   NEUTROABS 7.5 07/27/2023   HGB 12.2 (L) 07/27/2023   HCT 36.4 (L) 07/27/2023   MCV 82.2 07/27/2023   PLT 319 07/27/2023      Chemistry      Component Value Date/Time   NA 136 07/27/2023 1415   K 3.6 07/27/2023 1415   CL 100 07/27/2023 1415   CO2 27 07/27/2023 1415   BUN 5 (L) 07/27/2023 1415   CREATININE 0.55 (L) 07/27/2023 1415      Component Value Date/Time   CALCIUM 9.8 07/27/2023 1415   ALKPHOS 478 (H) 07/27/2023 1415   AST 14 (L)  07/27/2023 1415   ALT 5 07/27/2023 1415   BILITOT 0.4 07/27/2023 1415       RADIOGRAPHIC STUDIES: I have personally reviewed the radiological images as listed and agreed with the findings in the report. NM PET (PSMA) SKULL TO MID THIGH  Result Date: 07/30/2023 CLINICAL DATA:  Prostate carcinoma residual/recurrent disease suspected. Also history of rectal cancer clinical need for differentiation between progressing cancer type. EXAM: NUCLEAR MEDICINE PET SKULL BASE TO THIGH TECHNIQUE: 7.77 mCi Flotufolastat (Posluma) was injected intravenously. Full-ring PET imaging was performed from the skull base to thigh after the radiotracer. CT data was obtained and used for attenuation correction and anatomic localization. COMPARISON:  CT March 25, 2018 FINDINGS: NECK Radiotracer avid low cervical lymph nodes.  For reference: -left lower jugular sheath lymph node measures 6 mm in short axis on image 36/4 with a max SUV of 24.3. Incidental CT finding: None. CHEST Radiotracer avid mediastinal lymph nodes.  For reference: -anterior mediastinal lymph node measures 19 mm in short axis on image 57/4 with a max SUV of 40.4 No definite radiotracer avid pulmonary nodules or masses. Incidental CT finding: Aortic atherosclerosis. Coronary artery calcifications. ABDOMEN/PELVIS Prostate: No focal activity in the prostate bed. Lymph nodes: Radiotracer avid right pelvic sidewall lymph node measures 4 mm in short axis on image 152/4 with a max SUV of 7.5. Radiotracer avid aortocaval lymph node measures 5 mm in short axis on image 124/4 with a max SUV of 7.9 Liver: No evidence of liver metastasis. Incidental CT finding: Aortic atherosclerosis. Rectosigmoid anastomosis. Normal appendix. Aortic atherosclerosis. SKELETON Widespread radiotracer avid sclerotic and mixed lesions throughout the axial and proximal appendicular skeleton. For reference: -ill-defined expansile sclerosis in the left scapula has a max SUV of 21.0 -sclerotic  lesion in the L2 vertebral body measures 13 mm on image 111/4 with a max SUV of 30.5 IMPRESSION: 1. Widespread radiotracer avid sclerotic and mixed lesions throughout the axial and proximal appendicular skeleton, consistent with widespread osseous prostate metastatic disease. 2. Radiotracer avid low cervical, mediastinal, and retroperitoneal abdominopelvic lymph nodes, consistent with prostate nodal metastatic disease. 3. No focal activity in the prostate bed. 4.  Aortic Atherosclerosis (ICD10-I70.0). Electronically Signed   By: Maudry Mayhew M.D.   On: 07/30/2023 10:08   CT Head Wo Contrast  Result Date: 07/18/2023 CLINICAL DATA:  Head trauma, abnormal mental status (Age 19-64y) Mental status change, unknown cause EXAM: CT HEAD WITHOUT CONTRAST TECHNIQUE: Contiguous axial images were obtained from the base of the skull through the vertex without intravenous contrast. RADIATION DOSE REDUCTION: This exam was performed according to the departmental dose-optimization program  which includes automated exposure control, adjustment of the mA and/or kV according to patient size and/or use of iterative reconstruction technique. COMPARISON:  CT Head 07/08/23 FINDINGS: Brain: No hemorrhage. No hydrocephalus. No extra-axial fluid collection. Chronic left MCA territory infarct. No CT evidence of an acute cortical infarct. No mass effect. No mass lesion. Vascular: No hyperdense vessel or unexpected calcification. Skull: Normal. Negative for fracture or focal lesion. Sinuses/Orbits: No middle ear or mastoid effusion. Paranasal sinuses are clear. Orbits are unremarkable. Other: 1.8 cm sebaceous cyst along the suboccipital scalp. IMPRESSION: No acute intracranial abnormality Electronically Signed   By: Lorenza Cambridge M.D.   On: 07/18/2023 10:46   DG Pelvis 1-2 Views  Result Date: 07/18/2023 CLINICAL DATA:  Pain after fall EXAM: PELVIS - 1 VIEW COMPARISON:  None Available. FINDINGS: No fracture or dislocation. Preserved joint  spaces and bone mineralization. Surgical changes along the pelvis including along the area of the rectosigmoid colon. IMPRESSION: No acute osseous abnormality. Electronically Signed   By: Karen Kays M.D.   On: 07/18/2023 10:15   DG Femur Min 2 Views Right  Result Date: 07/18/2023 CLINICAL DATA:  Pain after fall EXAM: RIGHT FEMUR 2 VIEWS COMPARISON:  None Available. FINDINGS: There is no evidence of fracture or other focal bone lesions. Soft tissues are unremarkable. IMPRESSION: No acute osseous abnormality Electronically Signed   By: Karen Kays M.D.   On: 07/18/2023 10:14   DG Chest 2 View  Result Date: 07/18/2023 CLINICAL DATA:  Recurrent falls. EXAM: CHEST - 2 VIEW COMPARISON:  X-ray 07/08/2023 FINDINGS: No pneumothorax or effusion. No consolidation. Normal cardiopericardial silhouette. Diffuse interstitial changes are again seen. Distribution is similar to previous. Degenerative changes of the spine. IMPRESSION: Persistent diffuse interstitial lung changes. Acute versus chronic. Recommend follow up or dedicated workup when appropriate Electronically Signed   By: Karen Kays M.D.   On: 07/18/2023 10:14   MR BRAIN WO CONTRAST  Result Date: 07/13/2023 CLINICAL DATA:  New onset seizure EXAM: MRI HEAD WITHOUT CONTRAST TECHNIQUE: Multiplanar, multiecho pulse sequences of the brain and surrounding structures were obtained without intravenous contrast. COMPARISON:  None Available. FINDINGS: Markedly motion degraded examination. Brain: No acute infarct, mass effect or extra-axial collection. No chronic microhemorrhage or siderosis. Large area of hyperintense T2-weighted signal throughout the left hemispheric white matter, unchanged. Old posterior left frontal infarct encephalomalacia. Generalized volume loss. The midline structures are normal. Vascular: Normal flow voids. Skull and upper cervical spine: Poor visualization of previously described lesions at C1 and C2 secondary to motion. Sinuses/Orbits:  Negative. Other: Left suboccipital scalp trichilemmal cyst. IMPRESSION: 1. Markedly motion degraded examination. 2. No acute intracranial abnormality. 3. Old infarcts and chronic ischemic microangiopathy. 4. Poor visualization of previously described lesions at C1 and C2 secondary to motion. Electronically Signed   By: Deatra Robinson M.D.   On: 07/13/2023 01:51   Overnight EEG with video  Result Date: 07/09/2023 Charlsie Quest, MD     07/10/2023  9:16 AM Patient Name: OWEN CAUGHLIN MRN: [id number] Epilepsy Attending: Charlsie Quest Referring Physician/Provider: Erick Blinks, MD Duration: 07/08/2023 2209 to 07/09/2023 2209 Patient history: 62 y.o. male with hx of prior L MCA stroke c/b seizures on Keppra 1000mg  BID with recent EEG showing L posterior quadrant spikes, who presents with aphasia out of proportion to confusion and intermittent RUE twitching. EEG to evaluate for seizure Level of alertness: Awake, asleep AEDs during EEG study: LEV, VPA, Ativan Technical aspects: This EEG study was done with scalp electrodes positioned  according to the 10-20 International system of electrode placement. Electrical activity was reviewed with band pass filter of 1-70Hz , sensitivity of 7 uV/mm, display speed of 5mm/sec with a 60Hz  notched filter applied as appropriate. EEG data were recorded continuously and digitally stored.  Video monitoring was available and reviewed as appropriate. Description: The posterior dominant rhythm consists of 8 Hz activity of moderate voltage (25-35 uV) seen predominantly in posterior head regions, asymmetric ( left<right) and reactive to eye opening and eye closing. Sleep was characterized by sleep spindles (12 to 15 Hz), maximal frontocentral region. EEG showed continuous 3 to 5 Hz theta-delta slowing in left hemisphere as well as intermittent generalized 3-6hz  theta-delta slowing. Lateralized periodic discharges with overriding fast activity were noted in left hemisphere, maximal  left posterior quadrant at 1Hz . Clinically, patient had right upper extremity jerking per Dr Derry Lory ( difficult to see on camera). 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 Patient was loaded with IV Valproic acid on 07/08/2023 at 2315. Subsequently clinical jerking resolved. EEG continued to show lateralized periodic discharges with overriding fast activity were noted in left hemisphere, maximal left posterior quadrant at 1-1.5Hz .  ABNORMALITY - Focal convulsive status epilepticus, left posterior quadrant. - Lateralized periodic discharges with overriding fast activity, left hemisphere, maximal left posterior quadrant ( LPD+F) - Continuous slow, left hemisphere - Intermittent slow, generalized - Background asymmetry, left<right  IMPRESSION: At the beginning of the study, patient reportedly had right upper extremity jerking. 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  Patient was loaded with IV Valproic acid on 07/08/2023 at 2315. Subsequently clinical jerking resolved. EEG continued to show evidence of epileptogenicity arising from left hemisphere, maximal left posterior quadrant with increased risk of seizure recurrence. Additionally there was cortical dysfunction arising from left hemisphere likely secondary to underlying stroke. Lastly there was moderate diffuse encephalopathy.  Charlsie Quest   CT Head Wo Contrast  Result Date: 07/08/2023 CLINICAL DATA:  Mental status changes. EXAM: CT HEAD WITHOUT CONTRAST TECHNIQUE: Contiguous axial images were obtained from the base of the skull through the vertex without intravenous contrast. RADIATION DOSE REDUCTION: This exam was performed according to the departmental dose-optimization program which includes automated exposure control, adjustment of the mA and/or kV according to  patient size and/or use of iterative reconstruction technique. COMPARISON:  MRI brain 07/05/2023 FINDINGS: Brain: There is no evidence for acute hemorrhage, hydrocephalus, mass lesion, or abnormal extra-axial fluid collection. No definite CT evidence for acute infarction. Patchy low attenuation in the deep hemispheric and periventricular white matter is nonspecific, but likely reflects chronic microvascular ischemic demyelination. Old left MCA territory infarct again noted. Vascular: No hyperdense vessel or unexpected calcification. Skull: No evidence for fracture. No worrisome lytic or sclerotic lesion. Sinuses/Orbits: The visualized paranasal sinuses and mastoid air cells are clear. Visualized portions of the globes and intraorbital fat are unremarkable. Other: None. IMPRESSION: 1. No acute intracranial abnormality. 2. Old left MCA territory infarct. 3. Chronic small vessel white matter ischemic disease. Electronically Signed   By: Kennith Center M.D.   On: 07/08/2023 16:31   MR Brain W Wo Contrast  Result Date: 07/08/2023 CLINICAL DATA:  Metastatic disease evaluation. Abnormal focus within the right lateral mass of T1 and left frontoparietal region. EXAM: MRI HEAD WITHOUT AND WITH CONTRAST TECHNIQUE: Multiplanar, multiecho pulse sequences of the brain and surrounding structures were obtained without and with intravenous contrast.  CONTRAST:  6mL GADAVIST GADOBUTROL 1 MMOL/ML IV SOLN COMPARISON:  05/27/2023 FINDINGS: Brain: Diffusion imaging does not show any acute or subacute infarction. Chronic small-vessel ischemic changes affect the pons. No focal cerebellar finding. Cerebral hemispheres show an old left middle cerebral artery territory infarction with gliosis and volume loss. Some involvement of the left basal ganglia as well. Small focus of enhancement in the white matter of the left parietal region has nearly completely resolved since the prior study. Therefore, this probably related to a subacute white  matter infarction. There are no intracranial enhancing lesions to suggest intracranial metastatic disease. Other than some hemosiderin deposition in the region of the old left MCA stroke, there is no sign of intracranial hemorrhage. No hydrocephalus or extra-axial collection. Vascular: Major vessels at the base of the brain show flow. Skull and upper cervical spine: The T1 level is not included in the region of the scan. However, there is newly seen low T1 signal within the C2 vertebral body which was not present 5 weeks ago. This is worrisome for osseous metastatic disease. There is also involvement of the right lateral mass of C1. Sinuses/Orbits: Clear/normal Other: Sebaceous cyst in the left skull base/cervical junction region. IMPRESSION: 1. No evidence of metastatic disease to the brain. Previously seen focus of enhancement in the left parietal white matter has nearly completely resolved. This is consistent with a subacute white matter infarction. 2. 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 Chronic small-vessel ischemic changes of the pons. 3. New finding of low T1 signal within the C2 vertebral body and the right lateral mass of C1 worrisome for osseous metastatic disease. These areas looked normal on the study done 5 weeks ago. Electronically Signed   By: Paulina Fusi M.D.   On: 07/08/2023 14:51   DG Chest Portable 1 View  Result Date: 07/08/2023 CLINICAL DATA:  Altered mental status. EXAM: PORTABLE CHEST 1 VIEW COMPARISON:  Chest radiograph dated 05/24/2023. FINDINGS: The heart size and mediastinal contours are within normal limits. Mild left basilar atelectasis/airspace disease. No significant pleural effusion or pneumothorax. Degenerative changes are seen in the spine. IMPRESSION: Mild left basilar atelectasis/airspace disease. Electronically Signed   By: Romona Curls M.D.   On: 07/08/2023 14:49

## 2023-08-02 ENCOUNTER — Ambulatory Visit: Payer: 59 | Admitting: Neurology

## 2023-08-03 ENCOUNTER — Ambulatory Visit: Payer: 59

## 2023-08-03 ENCOUNTER — Telehealth: Payer: Self-pay

## 2023-08-03 DIAGNOSIS — C61 Malignant neoplasm of prostate: Secondary | ICD-10-CM

## 2023-08-07 NOTE — Progress Notes (Unsigned)
Patient Care Team: Jerrell Belfast, MD as PCP - General (Internal Medicine) [name], RN as Oncology Nurse Navigator  Clinic Day:  08/08/2023  Referring physician: Jerrell Belfast, MD  ASSESSMENT & PLAN:   Assessment & Plan:  62 y.o.m with history of CVA, recurrent seizure, locally advanced rectal adenocarcinoma, mCRPC post ADT/ARPI here for follow-up after PSMA PET/CT.  PSA rising quickly.  CEA is not elevated with h/o high risk rectal cancer.   Restaging imaging showed disease progression, and rising PSA.  We discussed potential treatment options today.  He has progressed on ADT, with ARPI.  We had a long discussion today including chemotherapy which is his next line.  Unfortunately, his performance status seem to decline.  He has been admitted twice since the last visit.  Report of seizure was difficult to control.  Mentally, he is AO x 1.  He does not remember his daughter's name at first and required reinforcement.  Report he gets confused at times.  He is sitting down on the chair most of the day.  He can walk a little bit around the house and gets tired very easily.  He has significant weight loss. We discussed docetaxel chemotherapy.  Potential side effect may include but not limited to fatigue, neutropenia, febrile neutropenia, thrombocytopenia, anemia, alopecia, skin rash, loss of nail and potentially infection, hypersensitivity, fluid retention with edema, diarrhea, nausea, vomiting, mouth sores, liver enzyme elevation, neuropathy, muscle pain, joint pain, severe infection resulting in sepsis, and potential death.  Discussed given his current performance status, side effects, complications from treatment may be more likely and resulted in ED visit, hospitalization, potential ICU admission and death.  He likely will have worse fatigue, decreased appetite as well.  We discussed goals of treatment is palliative.  Most treatment will work for some time, and eventually will need to switch  to another treatment upon progression just like right now.  We discussed alternative, hospice to focus on quality of life and control symptoms is an option.  We discussed what hospice is.  Daughter is familiar.  After prolonged consideration, he will proceed with hospice.  Hospice referral placed.  Melven Sartorius, MD  [facility] [address] [place] [address] Dept: [phone] Dept Fax: [phone]   Orders Placed This Encounter  Procedures   Ambulatory referral to Hospice    Referral Priority:   Routine    Referral Type:   Consultation    Referral Reason:   Specialty Services Required    Requested Specialty:   Hospice Services    Number of Visits Requested:   1      CHIEF COMPLAINT:  CC: mCRPC  INTERVAL HISTORY:  Parrish is here today for repeat clinical assessment.  He denies new bone pain. Report some shoulder pain comes and goes. His appetite is the same. He gets tired easily. He sits most of the day. No more seizures or headache.  No fever, chills, bleeding or bloody stool, hematuria. No difficulty urinating. No abdominal pain, nausea, vomiting, diarrhea, or constipation.  Since the stroke and seizures memory has declined.  I have reviewed the past medical history, past surgical history, social history and family history with the patient and they are unchanged from previous note.  ALLERGIES:  is allergic to valproic acid and related.  MEDICATIONS:  Current Outpatient Medications  Medication Sig Dispense Refill   amLODipine (NORVASC) 5 MG tablet Take 5 mg by mouth  at bedtime.     aspirin EC 81 MG tablet Take 1 tablet (81 mg total) by mouth daily. Swallow whole.     clopidogrel (PLAVIX) 75 MG tablet Take 75 mg by mouth daily.     diazePAM, 15 MG Dose, (VALTOCO 15 MG DOSE) 2 x 7.5 MG/0.1ML LQPK Place 15 mg into the nose as directed. Place 15 mg into the nose for seizures lasting longer than 2  minutes. 2 each 0   Fluticasone-Umeclidin-Vilant (TRELEGY ELLIPTA) 100-62.5-25 MCG/ACT AEPB Inhale 1 puff into the lungs daily.     folic acid (FOLVITE) 1 MG tablet Take 1 tablet (1 mg total) by mouth daily. 30 tablet 0   levETIRAcetam (KEPPRA) 1000 MG tablet Take 2 tablets (2,000 mg total) by mouth 2 (two) times daily. 120 tablet 0   metoprolol tartrate (LOPRESSOR) 50 MG tablet Take 1 tablet (50 mg total) by mouth 2 (two) times daily.     montelukast (SINGULAIR) 10 MG tablet Take 10 mg by mouth at bedtime.     Multiple Vitamin (MULTIVITAMIN WITH MINERALS) TABS tablet Take 1 tablet by mouth daily. 30 tablet 0   rosuvastatin (CRESTOR) 20 MG tablet Take 20 mg by mouth at bedtime.     tamsulosin (FLOMAX) 0.4 MG CAPS capsule Take 0.4 mg by mouth daily.     No current facility-administered medications for this visit.    HISTORY OF PRESENT ILLNESS:   Oncology History Overview Note  Initial diagnosis: 04/15/2020 PSA 150.24  05/2020 Abdominal and pelvic lymphadenopathy present at diagnosis. 05/28/20 Biopsy of the prostate demonstrated 8 of 8 cores positive with a Gleason score of 4+5= 9.  Gleason score 9  07/16/2020 starteed Lupron every 3 months   11/2020 Pylarify PET CT scattered hypermetabolic lymph nodes in the axilla retroperitoneal lymph nodes, and pelvic lymph nodes have resolved. New small hypermetabolic paratracheal esophageal lymph node 7 mm to be monitored.   09/2020 PSA 0.96 11/24/20 5500 cGy to the prostate completed January 05, 2021   12/09/2020 PSA 0.80 03/25/2021 PSA 0.28  01/31/2022 PSA 8.49   03/2022 PET showed evidence of early skeletal metastatic disease identified with lesions in the L3 vertebral body and the spinous process of the left scapula. Left perirectal, right obturator, and right retrocrural lymph nodes demonstrating increased tracer avidity, suspected to represent metastatic disease   04/18/2022 PSA 78.27  04/21/22 Xtandi 160 mg started. 07/22/2022 PSA 0.7 10/17/2022.  PSA 1.44 02/2023 PSA 13.43 03/16/23 PSA 27.42 04/07/23 FDG PET 1.  Progressive hypermetabolic lymphadenopathy within the neck and chest as described above.  Neck maximum SUV value of 25.8.  1 cm left paratracheal lymph node demonstrating maximum SUV value of 45.0  Anterior mediastinal lymph node measuring 2.1 cm in diameter demonstrating maximum SUV value of 66.7 international units appears progressive compared to previous study (previously measured 1 cm in diameter with a maximum SUV value of 39.2 international units  ABDOMEN/PELVIS:Hypermetabolic right obturator lymph node measures 1.2 cm and demonstrates a maximum SUV value of 10.2 international units (unchanged in size with previous maximum SUV value 8.3 international units).  No discrete evidence of tracer avid disease within the prostate gland.   2.  Progressive hypermetabolic osseous metastatic disease.  Previous index hypermetabolic focus involving the L3 vertebral body probably demonstrates maximum SUV value of 77.6 international units (previous maximum SUV value 42.4 international units).  Focus of uptake involving the T4 vertebral body with maximum SUV value of 83.2 international units is noted (previous maximum SUV value 86.3 international units).  Previous focus of FDG uptake involving the odontoid process probably demonstrates a maximum SUV value of 113 international units (previous maximum SUV value of 55.3 international units)  New hypermetabolic focus involving the right aspect of the frontal bone is identified with a maximum SUV value of 15.5 international units.  New focus of FDG uptake within the C6 vertebral body with maximum SUV value of 71.8 international units   04/2023 NGS   04/12/23 PSA 35.38  04/21/23 last visit at [facility].  06/22/23 first visit to [facility].  Recommend proceed with PET scanning restaging, referral for genetic testing, somatic testing.  Unfortunately, patient was admitted later in the month  twice for altered mental status, seizure.  MRI was negative for metastases. EEG reported evidence of epileptogenicity and cortical dysfunction arising from left hemisphere, maximal left posterior quadrant likely secondary to underlying stroke.   [employer]: BRCA1 VUS p.[id number]  Foundation one: KRAS K117N. DNMT3A K744. TP53 rearrangement intron 1.  TMB 10 muts/mb. MSI high-not detected.   07/27/23 PSA 685.  Total testosterone 68. PSMA PET 1. Widespread radiotracer avid sclerotic and mixed lesions throughout the axial and proximal appendicular skeleton, consistent with widespread osseous prostate metastatic disease. 2. Radiotracer avid low cervical, mediastinal, and retroperitoneal abdominopelvic lymph nodes, consistent with prostate nodal metastatic disease. 3. No focal activity in the prostate bed. 4.  Aortic Atherosclerosis (ICD10-I70.0).   Prostate cancer (HCC)  05/24/2023 Initial Diagnosis   Prostate cancer (HCC)   06/22/2023 Cancer Staging   Staging form: Prostate, AJCC 8th Edition - Clinical stage from 06/22/2023: Stage IVB (rcTX, cN1, cM1c, PSA: 150, Grade Group: 5) - Signed by Melven Sartorius, MD on 06/22/2023 Stage prefix: Recurrence Prostate specific antigen (PSA) range: 20 or greater Histologic grading system: 5 grade system   Rectal cancer (HCC)  07/09/2023 Initial Diagnosis   Rectal cancer (HCC)   07/09/2023 Genetic Testing   Negative genetic testing on the CancerNext-Expanded+RNAinsight.  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  The report date is July 10, 2023.  The CancerNext-Expanded gene panel offered by [facility] and includes sequencing and rearrangement analysis for the following 77 genes: AIP, ALK, APC*, ATM*, AXIN2, BAP1, BARD1, BMPR1A, BRCA1*, BRCA2*, BRIP1*, CDC73, CDH1*, CDK4, CDKN1B, CDKN2A, CHEK2*, CTNNA1, DICER1, FH, FLCN, KIF1B, LZTR1, MAX, MEN1, MET, MLH1*, MSH2*, MSH3, MSH6*, MUTYH*, NF1*, NF2, NTHL1, PALB2*, PHOX2B, PMS2*, POT1, PRKAR1A, PTCH1,  PTEN*, RAD51C*, RAD51D*, RB1, RET, SDHA, SDHAF2, SDHB, SDHC, SDHD, SMAD4, SMARCA4, SMARCB1, SMARCE1, STK11, SUFU, TMEM127, TP53*, TSC1, TSC2, and VHL (sequencing and deletion/duplication); EGFR, EGLN1, HOXB13, KIT, MITF, PDGFRA, POLD1, and POLE (sequencing only); EPCAM and GREM1 (deletion/duplication only). DNA and RNA analyses performed for * genes.        REVIEW OF SYSTEMS:   Constitutional: Denies fevers, chills or abnormal weight loss Eyes: Denies blurriness of vision Ears, nose, mouth, throat, and face: Denies mucositis or sore throat Respiratory: Denies cough, dyspnea or wheezes Cardiovascular: Denies palpitation, chest discomfort or lower extremity swelling Gastrointestinal:  Denies nausea, heartburn or change in bowel habits Skin: Denies abnormal skin rashes Lymphatics: Denies new lymphadenopathy or easy bruising Neurological: baseline right upper extremity and lower extremity numbness, tingling or new weaknesses. Baseline right leg weakness. Behavioral/Psych: Mood is stable, no new changes  All other systems were reviewed with the patient and are negative.   VITALS:  Blood pressure 111/69, pulse (!) 106, temperature (!) 97.3 F (36.3 C), resp. rate 18, weight 124 lb 12.8 oz (56.6 kg), SpO2 99%.  Wt Readings from Last 3 Encounters:  08/08/23 124 lb 12.8 oz (56.6 kg)  07/18/23 154 lb 5.2 oz (70 kg)  07/08/23 153 lb (69.4 kg)    Body mass index is 20.77 kg/m.  Performance status (ECOG): 3 - Symptomatic, >50% confined to bed  PHYSICAL EXAM:   GENERAL:alert, no distress and comfortable on wheelchair OROPHARYNX: no exudate, no erythema    LUNGS: clear to auscultation with normal breathing effort.  No wheeze or rales HEART: Tachycardic ABDOMEN: abdomen soft, non-tender and nondistended Musculoskeletal: no edema NEURO: alert and oriented x 1.  Right upper extremity and lower extremity 4 5.  LABORATORY DATA:  I have reviewed the data as listed    Component Value  Date/Time   NA 136 07/27/2023 1415   K 3.6 07/27/2023 1415   CL 100 07/27/2023 1415   CO2 27 07/27/2023 1415   GLUCOSE 106 (H) 07/27/2023 1415   BUN 5 (L) 07/27/2023 1415   CREATININE 0.55 (L) 07/27/2023 1415   CALCIUM 9.8 07/27/2023 1415   PROT 8.2 (H) 07/27/2023 1415   ALBUMIN 3.8 07/27/2023 1415   AST 14 (L) 07/27/2023 1415   ALT 5 07/27/2023 1415   ALKPHOS 478 (H) 07/27/2023 1415   BILITOT 0.4 07/27/2023 1415   GFRNONAA >60 07/27/2023 1415   GFRAA >60 03/28/2018 0622    No results found for: "SPEP", "UPEP"  Lab Results  Component Value Date   WBC 10.3 07/27/2023   NEUTROABS 7.5 07/27/2023   HGB 12.2 (L) 07/27/2023   HCT 36.4 (L) 07/27/2023   MCV 82.2 07/27/2023   PLT 319 07/27/2023      Chemistry      Component Value Date/Time   NA 136 07/27/2023 1415   K 3.6 07/27/2023 1415   CL 100 07/27/2023 1415   CO2 27 07/27/2023 1415   BUN 5 (L) 07/27/2023 1415   CREATININE 0.55 (L) 07/27/2023 1415      Component Value Date/Time   CALCIUM 9.8 07/27/2023 1415   ALKPHOS 478 (H) 07/27/2023 1415   AST 14 (L) 07/27/2023 1415   ALT 5 07/27/2023 1415   BILITOT 0.4 07/27/2023 1415       RADIOGRAPHIC STUDIES: I have personally reviewed the radiological images as listed and agreed with the findings in the report. NM PET (PSMA) SKULL TO MID THIGH  Result Date: 07/30/2023 CLINICAL DATA:  Prostate carcinoma residual/recurrent disease suspected. Also history of rectal cancer clinical need for differentiation between progressing cancer type. EXAM: NUCLEAR MEDICINE PET SKULL BASE TO THIGH TECHNIQUE: 7.77 mCi Flotufolastat (Posluma) was injected intravenously. Full-ring PET imaging was performed from the skull base to thigh after the radiotracer. CT data was obtained and used for attenuation correction and anatomic localization. COMPARISON:  CT March 25, 2018 FINDINGS: NECK Radiotracer avid low cervical lymph nodes.  For reference: -left lower jugular sheath lymph node measures 6 mm  in short axis on image 36/4 with a max SUV of 24.3. Incidental CT finding: None. CHEST Radiotracer avid mediastinal lymph nodes.  For reference: -anterior mediastinal lymph node measures 19 mm in short axis on image 57/4 with a max SUV of 40.4 No definite radiotracer avid pulmonary nodules or masses. Incidental CT finding: Aortic atherosclerosis. Coronary artery calcifications. ABDOMEN/PELVIS Prostate: No focal activity in the prostate bed. Lymph nodes: Radiotracer avid right pelvic sidewall lymph node measures 4 mm in short axis on image 152/4 with a max SUV of 7.5. Radiotracer avid aortocaval lymph node measures 5 mm in short axis on image 124/4 with a max SUV of 7.9 Liver:  No evidence of liver metastasis. Incidental CT finding: Aortic atherosclerosis. Rectosigmoid anastomosis. Normal appendix. Aortic atherosclerosis. SKELETON Widespread radiotracer avid sclerotic and mixed lesions throughout the axial and proximal appendicular skeleton. For reference: -ill-defined expansile sclerosis in the left scapula has a max SUV of 21.0 -sclerotic lesion in the L2 vertebral body measures 13 mm on image 111/4 with a max SUV of 30.5 IMPRESSION: 1. Widespread radiotracer avid sclerotic and mixed lesions throughout the axial and proximal appendicular skeleton, consistent with widespread osseous prostate metastatic disease. 2. Radiotracer avid low cervical, mediastinal, and retroperitoneal abdominopelvic lymph nodes, consistent with prostate nodal metastatic disease. 3. No focal activity in the prostate bed. 4.  Aortic Atherosclerosis (ICD10-I70.0). Electronically Signed   By: Maudry Mayhew M.D.   On: 07/30/2023 10:08   CT Head Wo Contrast  Result Date: 07/18/2023 CLINICAL DATA:  Head trauma, abnormal mental status (Age 21-64y) Mental status change, unknown cause EXAM: CT HEAD WITHOUT CONTRAST TECHNIQUE: Contiguous axial images were obtained from the base of the skull through the vertex without intravenous contrast.  RADIATION DOSE REDUCTION: This exam was performed according to the departmental dose-optimization program which includes automated exposure control, adjustment of the mA and/or kV according to patient size and/or use of iterative reconstruction technique. COMPARISON:  CT Head 07/08/23 FINDINGS: Brain: No hemorrhage. No hydrocephalus. No extra-axial fluid collection. Chronic left MCA territory infarct. No CT evidence of an acute cortical infarct. No mass effect. No mass lesion. Vascular: No hyperdense vessel or unexpected calcification. Skull: Normal. Negative for fracture or focal lesion. Sinuses/Orbits: No middle ear or mastoid effusion. Paranasal sinuses are clear. Orbits are unremarkable. Other: 1.8 cm sebaceous cyst along the suboccipital scalp. IMPRESSION: No acute intracranial abnormality Electronically Signed   By: Lorenza Cambridge M.D.   On: 07/18/2023 10:46   DG Pelvis 1-2 Views  Result Date: 07/18/2023 CLINICAL DATA:  Pain after fall EXAM: PELVIS - 1 VIEW COMPARISON:  None Available. FINDINGS: No fracture or dislocation. Preserved joint spaces and bone mineralization. Surgical changes along the pelvis including along the area of the rectosigmoid colon. IMPRESSION: No acute osseous abnormality. Electronically Signed   By: Karen Kays M.D.   On: 07/18/2023 10:15   DG Femur Min 2 Views Right  Result Date: 07/18/2023 CLINICAL DATA:  Pain after fall EXAM: RIGHT FEMUR 2 VIEWS COMPARISON:  None Available. FINDINGS: There is no evidence of fracture or other focal bone lesions. Soft tissues are unremarkable. IMPRESSION: No acute osseous abnormality Electronically Signed   By: Karen Kays M.D.   On: 07/18/2023 10:14   DG Chest 2 View  Result Date: 07/18/2023 CLINICAL DATA:  Recurrent falls. EXAM: CHEST - 2 VIEW COMPARISON:  X-ray 07/08/2023 FINDINGS: No pneumothorax or effusion. No consolidation. Normal cardiopericardial silhouette. Diffuse interstitial changes are again seen. Distribution is similar to  previous. Degenerative changes of the spine. IMPRESSION: Persistent diffuse interstitial lung changes. Acute versus chronic. Recommend follow up or dedicated workup when appropriate Electronically Signed   By: Karen Kays M.D.   On: 07/18/2023 10:14   MR BRAIN WO CONTRAST  Result Date: 07/13/2023 CLINICAL DATA:  New onset seizure EXAM: MRI HEAD WITHOUT CONTRAST TECHNIQUE: Multiplanar, multiecho pulse sequences of the brain and surrounding structures were obtained without intravenous contrast. COMPARISON:  None Available. FINDINGS: Markedly motion degraded examination. Brain: No acute infarct, mass effect or extra-axial collection. No chronic microhemorrhage or siderosis. Large area of hyperintense T2-weighted signal throughout the left hemispheric white matter, unchanged. Old posterior left frontal infarct encephalomalacia. Generalized volume loss.  The midline structures are normal. Vascular: Normal flow voids. Skull and upper cervical spine: Poor visualization of previously described lesions at C1 and C2 secondary to motion. Sinuses/Orbits: Negative. Other: Left suboccipital scalp trichilemmal cyst. IMPRESSION: 1. Markedly motion degraded examination. 2. No acute intracranial abnormality. 3. Old infarcts and chronic ischemic microangiopathy. 4. Poor visualization of previously described lesions at C1 and C2 secondary to motion. Electronically Signed   By: Deatra Robinson M.D.   On: 07/13/2023 01:51

## 2023-08-07 NOTE — Assessment & Plan Note (Signed)
  Supportive baseline bone mineral density study and then every 2 years calcium (1000-1200 mg daily from food and supplements) and vitamin D3 (1000 IU daily) Zometa 4 mg every 3 months Control and prevent diabetes Weight-bearing exercises (30 minutes per day) Limit alcohol consumption and avoid smoking

## 2023-08-08 ENCOUNTER — Inpatient Hospital Stay (HOSPITAL_BASED_OUTPATIENT_CLINIC_OR_DEPARTMENT_OTHER): Payer: 59

## 2023-08-08 ENCOUNTER — Other Ambulatory Visit: Payer: Self-pay

## 2023-08-08 VITALS — BP 111/69 | HR 106 | Temp 97.3°F | Resp 18 | Wt 124.8 lb

## 2023-08-08 DIAGNOSIS — R296 Repeated falls: Secondary | ICD-10-CM | POA: Diagnosis not present

## 2023-08-08 DIAGNOSIS — C61 Malignant neoplasm of prostate: Secondary | ICD-10-CM | POA: Diagnosis present

## 2023-08-08 DIAGNOSIS — Z9189 Other specified personal risk factors, not elsewhere classified: Secondary | ICD-10-CM | POA: Diagnosis not present

## 2023-08-08 DIAGNOSIS — Z79899 Other long term (current) drug therapy: Secondary | ICD-10-CM | POA: Diagnosis not present

## 2023-08-08 DIAGNOSIS — C7951 Secondary malignant neoplasm of bone: Secondary | ICD-10-CM | POA: Diagnosis present

## 2023-08-08 DIAGNOSIS — Z8673 Personal history of transient ischemic attack (TIA), and cerebral infarction without residual deficits: Secondary | ICD-10-CM | POA: Diagnosis not present

## 2023-08-08 DIAGNOSIS — C19 Malignant neoplasm of rectosigmoid junction: Secondary | ICD-10-CM | POA: Diagnosis not present

## 2023-10-11 DEATH — deceased
